# Patient Record
Sex: Female | Born: 1960 | Race: Black or African American | Hispanic: No | Smoking: Current every day smoker
Health system: Southern US, Community
[De-identification: ages and names within clinical notes are randomized; demographics above are authoritative.]

## PROBLEM LIST (undated history)

## (undated) DIAGNOSIS — I10 Essential (primary) hypertension: Secondary | ICD-10-CM

## (undated) DIAGNOSIS — F329 Major depressive disorder, single episode, unspecified: Secondary | ICD-10-CM

## (undated) DIAGNOSIS — M1990 Unspecified osteoarthritis, unspecified site: Secondary | ICD-10-CM

## (undated) DIAGNOSIS — F419 Anxiety disorder, unspecified: Secondary | ICD-10-CM

## (undated) DIAGNOSIS — K219 Gastro-esophageal reflux disease without esophagitis: Secondary | ICD-10-CM

## (undated) DIAGNOSIS — R269 Unspecified abnormalities of gait and mobility: Secondary | ICD-10-CM

## (undated) DIAGNOSIS — D649 Anemia, unspecified: Secondary | ICD-10-CM

## (undated) DIAGNOSIS — F32A Depression, unspecified: Secondary | ICD-10-CM

## (undated) DIAGNOSIS — G5600 Carpal tunnel syndrome, unspecified upper limb: Secondary | ICD-10-CM

## (undated) DIAGNOSIS — G43909 Migraine, unspecified, not intractable, without status migrainosus: Secondary | ICD-10-CM

## (undated) DIAGNOSIS — J189 Pneumonia, unspecified organism: Secondary | ICD-10-CM

## (undated) DIAGNOSIS — I639 Cerebral infarction, unspecified: Secondary | ICD-10-CM

## (undated) DIAGNOSIS — F17200 Nicotine dependence, unspecified, uncomplicated: Secondary | ICD-10-CM

## (undated) DIAGNOSIS — H269 Unspecified cataract: Secondary | ICD-10-CM

## (undated) DIAGNOSIS — R0600 Dyspnea, unspecified: Secondary | ICD-10-CM

## (undated) DIAGNOSIS — E785 Hyperlipidemia, unspecified: Secondary | ICD-10-CM

## (undated) HISTORY — DX: Cerebral infarction, unspecified: I63.9

## (undated) HISTORY — PX: OTHER SURGICAL HISTORY: SHX169

## (undated) HISTORY — DX: Unspecified cataract: H26.9

## (undated) HISTORY — PX: CARPAL TUNNEL RELEASE: SHX101

## (undated) HISTORY — DX: Hyperlipidemia, unspecified: E78.5

## (undated) HISTORY — DX: Depression, unspecified: F32.A

## (undated) HISTORY — PX: CATARACT EXTRACTION W/ INTRAOCULAR LENS IMPLANT: SHX1309

## (undated) HISTORY — PX: ABDOMINAL HYSTERECTOMY: SHX81

## (undated) HISTORY — DX: Unspecified osteoarthritis, unspecified site: M19.90

## (undated) HISTORY — PX: TUBAL LIGATION: SHX77

## (undated) HISTORY — DX: Nicotine dependence, unspecified, uncomplicated: F17.200

## (undated) HISTORY — DX: Unspecified abnormalities of gait and mobility: R26.9

## (undated) HISTORY — DX: Anxiety disorder, unspecified: F41.9

## (undated) HISTORY — DX: Carpal tunnel syndrome, unspecified upper limb: G56.00

---
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
## 2017-05-05 DIAGNOSIS — I639 Cerebral infarction, unspecified: Secondary | ICD-10-CM

## 2017-05-05 HISTORY — DX: Cerebral infarction, unspecified: I63.9

## 2018-01-23 ENCOUNTER — Emergency Department (HOSPITAL_COMMUNITY)
Admission: EM | Admit: 2018-01-23 | Discharge: 2018-01-23 | Disposition: A | Discharge status: Nursing Home (Includes ICF) | Payer: Self-pay | Source: Home / Self Care | Attending: Emergency Medicine | Admitting: Emergency Medicine

## 2018-01-23 ENCOUNTER — Other Ambulatory Visit: Payer: Self-pay

## 2018-01-23 ENCOUNTER — Emergency Department (HOSPITAL_COMMUNITY): Payer: Self-pay

## 2018-01-23 ENCOUNTER — Observation Stay (HOSPITAL_COMMUNITY)
Admission: EM | Admit: 2018-01-23 | Discharge: 2018-01-25 | Disposition: A | Discharge status: Home / Self Care | Payer: Self-pay | Attending: Internal Medicine | Admitting: Internal Medicine

## 2018-01-23 ENCOUNTER — Encounter (HOSPITAL_COMMUNITY): Payer: Self-pay | Admitting: Emergency Medicine

## 2018-01-23 DIAGNOSIS — R531 Weakness: Secondary | ICD-10-CM | POA: Insufficient documentation

## 2018-01-23 DIAGNOSIS — F329 Major depressive disorder, single episode, unspecified: Secondary | ICD-10-CM | POA: Insufficient documentation

## 2018-01-23 DIAGNOSIS — F17210 Nicotine dependence, cigarettes, uncomplicated: Secondary | ICD-10-CM | POA: Insufficient documentation

## 2018-01-23 DIAGNOSIS — N179 Acute kidney failure, unspecified: Secondary | ICD-10-CM | POA: Diagnosis present

## 2018-01-23 DIAGNOSIS — G459 Transient cerebral ischemic attack, unspecified: Principal | ICD-10-CM | POA: Insufficient documentation

## 2018-01-23 DIAGNOSIS — I10 Essential (primary) hypertension: Secondary | ICD-10-CM | POA: Insufficient documentation

## 2018-01-23 DIAGNOSIS — R0789 Other chest pain: Secondary | ICD-10-CM | POA: Insufficient documentation

## 2018-01-23 DIAGNOSIS — Z79899 Other long term (current) drug therapy: Secondary | ICD-10-CM | POA: Insufficient documentation

## 2018-01-23 DIAGNOSIS — R55 Syncope and collapse: Secondary | ICD-10-CM

## 2018-01-23 DIAGNOSIS — F32A Depression, unspecified: Secondary | ICD-10-CM | POA: Diagnosis present

## 2018-01-23 DIAGNOSIS — R079 Chest pain, unspecified: Secondary | ICD-10-CM

## 2018-01-23 DIAGNOSIS — Z7982 Long term (current) use of aspirin: Secondary | ICD-10-CM | POA: Insufficient documentation

## 2018-01-23 DIAGNOSIS — E876 Hypokalemia: Secondary | ICD-10-CM | POA: Diagnosis present

## 2018-01-23 HISTORY — DX: Unspecified osteoarthritis, unspecified site: M19.90

## 2018-01-23 HISTORY — DX: Essential (primary) hypertension: I10

## 2018-01-23 LAB — CBC
HCT: 43 % (ref 36.0–46.0)
HEMATOCRIT: 42.6 % (ref 36.0–46.0)
HEMOGLOBIN: 14 g/dL (ref 12.0–15.0)
HEMOGLOBIN: 14.2 g/dL (ref 12.0–15.0)
MCH: 29 pg (ref 26.0–34.0)
MCH: 29.2 pg (ref 26.0–34.0)
MCHC: 32.9 g/dL (ref 30.0–36.0)
MCHC: 33 g/dL (ref 30.0–36.0)
MCV: 88.2 fL (ref 78.0–100.0)
MCV: 88.3 fL (ref 78.0–100.0)
Platelets: 226 10*3/uL (ref 150–400)
Platelets: 220 10*3/uL (ref 150–400)
RBC: 4.83 MIL/uL (ref 3.87–5.11)
RBC: 4.87 MIL/uL (ref 3.87–5.11)
RDW: 12.8 % (ref 11.5–15.5)
RDW: 12.7 % (ref 11.5–15.5)
WBC: 6 10*3/uL (ref 4.0–10.5)
WBC: 5.9 10*3/uL (ref 4.0–10.5)

## 2018-01-23 LAB — URINALYSIS, ROUTINE W REFLEX MICROSCOPIC
Bacteria, UA: NONE SEEN
Bilirubin Urine: NEGATIVE
GLUCOSE, UA: NEGATIVE mg/dL
Ketones, ur: NEGATIVE mg/dL
Nitrite: NEGATIVE
PROTEIN: NEGATIVE mg/dL
Specific Gravity, Urine: 1.016 (ref 1.005–1.030)
pH: 5 (ref 5.0–8.0)

## 2018-01-23 LAB — COMPREHENSIVE METABOLIC PANEL
ALT: 14 U/L (ref 0–44)
AST: 39 U/L (ref 15–41)
Albumin: 4.2 g/dL (ref 3.5–5.0)
Alkaline Phosphatase: 85 U/L (ref 38–126)
Anion gap: 12 (ref 5–15)
BILIRUBIN TOTAL: 0.8 mg/dL (ref 0.3–1.2)
BUN: 20 mg/dL (ref 6–20)
CO2: 27 mmol/L (ref 22–32)
Calcium: 9.4 mg/dL (ref 8.9–10.3)
Chloride: 100 mmol/L (ref 98–111)
Creatinine, Ser: 1.67 mg/dL — ABNORMAL HIGH (ref 0.44–1.00)
GFR, EST AFRICAN AMERICAN: 38 mL/min — AB (ref >60)
GFR, EST NON AFRICAN AMERICAN: 33 mL/min — AB (ref >60)
Glucose, Bld: 169 mg/dL — ABNORMAL HIGH (ref 70–99)
Potassium: 3.1 mmol/L — ABNORMAL LOW (ref 3.5–5.1)
Sodium: 139 mmol/L (ref 135–145)
TOTAL PROTEIN: 7.2 g/dL (ref 6.5–8.1)

## 2018-01-23 LAB — DIFFERENTIAL
BASOS ABS: 0 10*3/uL (ref 0.0–0.1)
BASOS ABS: 0 10*3/uL (ref 0.0–0.1)
Basophils Relative: 0 %
Basophils Relative: 0 %
EOS ABS: 0.1 10*3/uL (ref 0.0–0.7)
Eosinophils Absolute: 0.1 10*3/uL (ref 0.0–0.7)
Eosinophils Relative: 1 %
Eosinophils Relative: 1 %
LYMPHS ABS: 2.2 10*3/uL (ref 0.7–4.0)
LYMPHS ABS: 2.2 10*3/uL (ref 0.7–4.0)
Lymphocytes Relative: 36 %
Lymphocytes Relative: 37 %
MONO ABS: 0.5 10*3/uL (ref 0.1–1.0)
MONOS PCT: 7 %
MONOS PCT: 8 %
Monocytes Absolute: 0.4 10*3/uL (ref 0.1–1.0)
Neutro Abs: 3.3 10*3/uL (ref 1.7–7.7)
Neutro Abs: 3.2 10*3/uL (ref 1.7–7.7)
Neutrophils Relative %: 56 %
Neutrophils Relative %: 54 %

## 2018-01-23 LAB — CBG MONITORING, ED: Glucose-Capillary: 194 mg/dL — ABNORMAL HIGH (ref 70–99)

## 2018-01-23 LAB — PROTIME-INR
INR: 0.97
INR: 0.95
Prothrombin Time: 12.8 seconds (ref 11.4–15.2)
Prothrombin Time: 12.6 seconds (ref 11.4–15.2)

## 2018-01-23 LAB — RAPID URINE DRUG SCREEN, HOSP PERFORMED
Amphetamines: NOT DETECTED
BARBITURATES: NOT DETECTED
Benzodiazepines: NOT DETECTED
Cocaine: NOT DETECTED
Opiates: NOT DETECTED
Tetrahydrocannabinol: NOT DETECTED

## 2018-01-23 LAB — I-STAT TROPONIN, ED
TROPONIN I, POC: 0 ng/mL (ref 0.00–0.08)
Troponin i, poc: 0 ng/mL (ref 0.00–0.08)

## 2018-01-23 LAB — I-STAT CHEM 8, ED
BUN: 24 mg/dL — ABNORMAL HIGH (ref 6–20)
CHLORIDE: 102 mmol/L (ref 98–111)
Calcium, Ion: 0.91 mmol/L — ABNORMAL LOW (ref 1.15–1.40)
Creatinine, Ser: 1.8 mg/dL — ABNORMAL HIGH (ref 0.44–1.00)
GLUCOSE: 165 mg/dL — AB (ref 70–99)
HCT: 42 % (ref 36.0–46.0)
Hemoglobin: 14.3 g/dL (ref 12.0–15.0)
POTASSIUM: 3.2 mmol/L — AB (ref 3.5–5.1)
Sodium: 136 mmol/L (ref 135–145)
TCO2: 26 mmol/L (ref 22–32)

## 2018-01-23 LAB — APTT
APTT: 28 s (ref 24–36)
aPTT: 27 seconds (ref 24–36)

## 2018-01-23 LAB — ETHANOL: Alcohol, Ethyl (B): <10 mg/dL (ref <10)

## 2018-01-23 LAB — I-STAT BETA HCG BLOOD, ED (MC, WL, AP ONLY): I-stat hCG, quantitative: <5 m[IU]/mL (ref <5)

## 2018-01-23 MED ORDER — HYDROCORTISONE NA SUCCINATE PF 250 MG IJ SOLR
200.0000 mg | Freq: Once | INTRAMUSCULAR | Status: AC
  Administered 2018-01-23: 200 mg via INTRAVENOUS
  Filled 2018-01-23: qty 200

## 2018-01-23 MED ORDER — DIPHENHYDRAMINE HCL 50 MG/ML IJ SOLN
25.0000 mg | Freq: Once | INTRAMUSCULAR | Status: AC | PRN
  Administered 2018-01-23: 25 mg via INTRAVENOUS
  Filled 2018-01-23: qty 1

## 2018-01-23 MED ORDER — HYDROCORTISONE NA SUCCINATE PF 250 MG IJ SOLR
200.0000 mg | Freq: Once | INTRAMUSCULAR | Status: DC
  Filled 2018-01-23: qty 200

## 2018-01-23 MED ORDER — DIPHENHYDRAMINE HCL 50 MG/ML IJ SOLN: 25.0000 mg | Freq: Once | INTRAMUSCULAR | Status: DC

## 2018-01-23 MED ORDER — IOPAMIDOL (ISOVUE-370) INJECTION 76%
125.0000 mL | Freq: Once | INTRAVENOUS | Status: AC | PRN
  Administered 2018-01-23: 100 mL via INTRAVENOUS

## 2018-01-23 MED ORDER — DIPHENHYDRAMINE HCL 50 MG/ML IJ SOLN
50.0000 mg | Freq: Once | INTRAMUSCULAR | Status: AC
  Administered 2018-01-23: 50 mg via INTRAVENOUS
  Filled 2018-01-23: qty 1

## 2018-01-23 NOTE — ED Notes (Signed)
AC called for Solu-Cortef

## 2018-01-23 NOTE — Progress Notes (Signed)
CODE STROKE 1629 CALL TIME 1649 EXAM STARTED 1651 EXAM FINISHED 1651 EXAM SENT TO SOC 1653 EXAM COMPLETE IN EPIC 1654 [place] [facility] CALLED

## 2018-01-23 NOTE — ED Notes (Addendum)
Ct notified of pt being medicated, ct advised that pt's next benadryl is 2137 and ct scan would be at 2237

## 2018-01-23 NOTE — ED Provider Notes (Signed)
Pt received at sign out with CT perfusion, CT-A head/neck pending. CT-A aorta held due to low GFR. CT's are reassuring. Pt's neuro exam has improved. VS remains stable. Troponin's negative x2 over 7 hour ED visit. Denies further CP/back pain. CXR ordered for completeness. Will admit for further stroke workup. 2340:  T/C returned from [facility] Dr. Darrick Meigs, case discussed, including:  HPI, pertinent PM/SHx, VS/PE, dx testing, ED course and treatment:  Aware CXR pending, agreeable to admit.   BP 113/81   Pulse 69   Temp 98.8 F (37.1 C) (Oral)   Resp 14   Ht 5\' 3"  (1.6 m)   Wt 76 kg   SpO2 97%   BMI 29.68 kg/m   Results for orders placed or performed during the hospital encounter of 01/23/18  Ethanol  Result Value Ref Range   Alcohol, Ethyl (B) <10 <10 mg/dL  Protime-INR  Result Value Ref Range   Prothrombin Time 12.6 11.4 - 15.2 seconds   INR 0.95   APTT  Result Value Ref Range   aPTT 27 24 - 36 seconds  CBC  Result Value Ref Range   WBC 5.9 4.0 - 10.5 K/uL   RBC 4.87 3.87 - 5.11 MIL/uL   Hemoglobin 14.2 12.0 - 15.0 g/dL   HCT 43.0 36.0 - 46.0 %   MCV 88.3 78.0 - 100.0 fL   MCH 29.2 26.0 - 34.0 pg   MCHC 33.0 30.0 - 36.0 g/dL   RDW 12.7 11.5 - 15.5 %   Platelets 220 150 - 400 K/uL  Differential  Result Value Ref Range   Neutrophils Relative % 54 %   Neutro Abs 3.2 1.7 - 7.7 K/uL   Lymphocytes Relative 37 %   Lymphs Abs 2.2 0.7 - 4.0 K/uL   Monocytes Relative 8 %   Monocytes Absolute 0.5 0.1 - 1.0 K/uL   Eosinophils Relative 1 %   Eosinophils Absolute 0.1 0.0 - 0.7 K/uL   Basophils Relative 0 %   Basophils Absolute 0.0 0.0 - 0.1 K/uL  Comprehensive metabolic panel  Result Value Ref Range   Sodium 139 135 - 145 mmol/L   Potassium 3.1 (L) 3.5 - 5.1 mmol/L   Chloride 100 98 - 111 mmol/L   CO2 27 22 - 32 mmol/L   Glucose, Bld 169 (H) 70 - 99 mg/dL   BUN 20 6 - 20 mg/dL   Creatinine, Ser 1.67 (H) 0.44 - 1.00 mg/dL   Calcium 9.4 8.9 - 10.3 mg/dL   Total Protein 7.2 6.5 - 8.1  g/dL   Albumin 4.2 3.5 - 5.0 g/dL   AST 39 15 - 41 U/L   ALT 14 0 - 44 U/L   Alkaline Phosphatase 85 38 - 126 U/L   Total Bilirubin 0.8 0.3 - 1.2 mg/dL   GFR calc non Af Amer 33 (L) >60 mL/min   GFR calc Af Amer 38 (L) >60 mL/min   Anion gap 12 5 - 15  Urine rapid drug screen (hosp performed)  Result Value Ref Range   Opiates NONE DETECTED NONE DETECTED   Cocaine NONE DETECTED NONE DETECTED   Benzodiazepines NONE DETECTED NONE DETECTED   Amphetamines NONE DETECTED NONE DETECTED   Tetrahydrocannabinol NONE DETECTED NONE DETECTED   Barbiturates NONE DETECTED NONE DETECTED  Urinalysis, Routine w reflex microscopic  Result Value Ref Range   Color, Urine YELLOW YELLOW   APPearance HAZY (A) CLEAR   Specific Gravity, Urine 1.016 1.005 - 1.030   pH 5.0 5.0 - 8.0  Glucose, UA NEGATIVE NEGATIVE mg/dL   Hgb urine dipstick SMALL (A) NEGATIVE   Bilirubin Urine NEGATIVE NEGATIVE   Ketones, ur NEGATIVE NEGATIVE mg/dL   Protein, ur NEGATIVE NEGATIVE mg/dL   Nitrite NEGATIVE NEGATIVE   Leukocytes, UA LARGE (A) NEGATIVE   RBC / HPF 21-50 0 - 5 RBC/hpf   WBC, UA 21-50 0 - 5 WBC/hpf   Bacteria, UA NONE SEEN NONE SEEN   Squamous Epithelial / LPF 0-5 0 - 5   Mucus PRESENT    Hyaline Casts, UA PRESENT   I-stat troponin, ED  Result Value Ref Range   Troponin i, poc 0.00 0.00 - 0.08 ng/mL   Comment 3           Ct Angio Head W Or Wo Contrast Result Date: 01/23/2018 CLINICAL DATA:  57 y/o F; left-sided weakness with onset earlier today. Further evaluation of stroke. EXAM: CT ANGIOGRAPHY HEAD AND NECK CT PERFUSION BRAIN TECHNIQUE: Multidetector CT imaging of the head and neck was performed using the standard protocol during bolus administration of intravenous contrast. Multiplanar CT image reconstructions and MIPs were obtained to evaluate the vascular anatomy. Carotid stenosis measurements (when applicable) are obtained utilizing NASCET criteria, using the distal internal carotid diameter as the  denominator. Multiphase CT imaging of the brain was performed following IV bolus contrast injection. Subsequent parametric perfusion maps were calculated using RAPID software. CONTRAST:  133mL ISOVUE-370 IOPAMIDOL (ISOVUE-370) INJECTION 76% COMPARISON:  01/23/2018 CT head. FINDINGS: CTA NECK FINDINGS Aortic arch: Standard branching. Imaged portion shows no evidence of aneurysm or dissection. No significant stenosis of the major arch vessel origins. Right carotid system: No evidence of dissection, stenosis (50% or greater) or occlusion. Left carotid system: No evidence of dissection, stenosis (50% or greater) or occlusion. Vertebral arteries: Right dominant. No evidence of dissection, stenosis (50% or greater) or occlusion. Skeleton: Moderate cervical spondylosis with predominantly discogenic degenerative changes from C3 through C7. There is loss of intervertebral disc space height and endplate marginal osteophytes. No high-grade bony canal stenosis. Uncovertebral and facet hypertrophy encroaches on the neural foramen bilaterally from C3 through C7. Other neck: 6 mm nodule in the right lobe of the thyroid gland. Dental disease with periapical cysts, absent crowns, and dental caries. Upper chest: Negative. Review of the MIP images confirms the above findings CTA HEAD FINDINGS Anterior circulation: No significant stenosis, proximal occlusion, aneurysm, or vascular malformation. Posterior circulation: No significant stenosis, proximal occlusion, aneurysm, or vascular malformation. Venous sinuses: As permitted by contrast timing, patent. Anatomic variants: Complete circle-of-Willis. Delayed phase: No abnormal intracranial enhancement. Review of the MIP images confirms the above findings CT Brain Perfusion Findings: CBF (<30%) Volume: 25mL Perfusion (Tmax>6.0s) volume: 77mL Mismatch Volume: 34mL Infarction Location:Not applicable. IMPRESSION: 1. Normal CT perfusion head. 2. Patent carotid and vertebral arteries. No  dissection, aneurysm, or hemodynamically significant stenosis utilizing NASCET criteria. 3. Patent anterior and posterior intracranial circulation. No large vessel occlusion, aneurysm, or significant stenosis. These results were called by telephone at the time of interpretation on 01/23/2018 at 11:15 pm to Dr. Thurnell Garbe, who verbally acknowledged these results. Electronically Signed   By: Kristine Garbe M.D.   On: 01/23/2018 23:19   Ct Angio Neck W And/or Wo Contrast Result Date: 01/23/2018 CLINICAL DATA:  57 y/o F; left-sided weakness with onset earlier today. Further evaluation of stroke. EXAM: CT ANGIOGRAPHY HEAD AND NECK CT PERFUSION BRAIN TECHNIQUE: Multidetector CT imaging of the head and neck was performed using the standard protocol during bolus administration of intravenous contrast.  Multiplanar CT image reconstructions and MIPs were obtained to evaluate the vascular anatomy. Carotid stenosis measurements (when applicable) are obtained utilizing NASCET criteria, using the distal internal carotid diameter as the denominator. Multiphase CT imaging of the brain was performed following IV bolus contrast injection. Subsequent parametric perfusion maps were calculated using RAPID software. CONTRAST:  152mL ISOVUE-370 IOPAMIDOL (ISOVUE-370) INJECTION 76% COMPARISON:  01/23/2018 CT head. FINDINGS: CTA NECK FINDINGS Aortic arch: Standard branching. Imaged portion shows no evidence of aneurysm or dissection. No significant stenosis of the major arch vessel origins. Right carotid system: No evidence of dissection, stenosis (50% or greater) or occlusion. Left carotid system: No evidence of dissection, stenosis (50% or greater) or occlusion. Vertebral arteries: Right dominant. No evidence of dissection, stenosis (50% or greater) or occlusion. Skeleton: Moderate cervical spondylosis with predominantly discogenic degenerative changes from C3 through C7. There is loss of intervertebral disc space height and  endplate marginal osteophytes. No high-grade bony canal stenosis. Uncovertebral and facet hypertrophy encroaches on the neural foramen bilaterally from C3 through C7. Other neck: 6 mm nodule in the right lobe of the thyroid gland. Dental disease with periapical cysts, absent crowns, and dental caries. Upper chest: Negative. Review of the MIP images confirms the above findings CTA HEAD FINDINGS Anterior circulation: No significant stenosis, proximal occlusion, aneurysm, or vascular malformation. Posterior circulation: No significant stenosis, proximal occlusion, aneurysm, or vascular malformation. Venous sinuses: As permitted by contrast timing, patent. Anatomic variants: Complete circle-of-Willis. Delayed phase: No abnormal intracranial enhancement. Review of the MIP images confirms the above findings CT Brain Perfusion Findings: CBF (<30%) Volume: 83mL Perfusion (Tmax>6.0s) volume: 84mL Mismatch Volume: 2mL Infarction Location:Not applicable. IMPRESSION: 1. Normal CT perfusion head. 2. Patent carotid and vertebral arteries. No dissection, aneurysm, or hemodynamically significant stenosis utilizing NASCET criteria. 3. Patent anterior and posterior intracranial circulation. No large vessel occlusion, aneurysm, or significant stenosis. These results were called by telephone at the time of interpretation on 01/23/2018 at 11:15 pm to Dr. Thurnell Garbe, who verbally acknowledged these results. Electronically Signed   By: Kristine Garbe M.D.   On: 01/23/2018 23:19   Ct Cerebral Perfusion W Contrast Result Date: 01/23/2018 CLINICAL DATA:  57 y/o F; left-sided weakness with onset earlier today. Further evaluation of stroke. EXAM: CT ANGIOGRAPHY HEAD AND NECK CT PERFUSION BRAIN TECHNIQUE: Multidetector CT imaging of the head and neck was performed using the standard protocol during bolus administration of intravenous contrast. Multiplanar CT image reconstructions and MIPs were obtained to evaluate the vascular anatomy.  Carotid stenosis measurements (when applicable) are obtained utilizing NASCET criteria, using the distal internal carotid diameter as the denominator. Multiphase CT imaging of the brain was performed following IV bolus contrast injection. Subsequent parametric perfusion maps were calculated using RAPID software. CONTRAST:  17mL ISOVUE-370 IOPAMIDOL (ISOVUE-370) INJECTION 76% COMPARISON:  01/23/2018 CT head. FINDINGS: CTA NECK FINDINGS Aortic arch: Standard branching. Imaged portion shows no evidence of aneurysm or dissection. No significant stenosis of the major arch vessel origins. Right carotid system: No evidence of dissection, stenosis (50% or greater) or occlusion. Left carotid system: No evidence of dissection, stenosis (50% or greater) or occlusion. Vertebral arteries: Right dominant. No evidence of dissection, stenosis (50% or greater) or occlusion. Skeleton: Moderate cervical spondylosis with predominantly discogenic degenerative changes from C3 through C7. There is loss of intervertebral disc space height and endplate marginal osteophytes. No high-grade bony canal stenosis. Uncovertebral and facet hypertrophy encroaches on the neural foramen bilaterally from C3 through C7. Other neck: 6 mm nodule in the right  lobe of the thyroid gland. Dental disease with periapical cysts, absent crowns, and dental caries. Upper chest: Negative. Review of the MIP images confirms the above findings CTA HEAD FINDINGS Anterior circulation: No significant stenosis, proximal occlusion, aneurysm, or vascular malformation. Posterior circulation: No significant stenosis, proximal occlusion, aneurysm, or vascular malformation. Venous sinuses: As permitted by contrast timing, patent. Anatomic variants: Complete circle-of-Willis. Delayed phase: No abnormal intracranial enhancement. Review of the MIP images confirms the above findings CT Brain Perfusion Findings: CBF (<30%) Volume: 69mL Perfusion (Tmax>6.0s) volume: 53mL Mismatch  Volume: 59mL Infarction Location:Not applicable. IMPRESSION: 1. Normal CT perfusion head. 2. Patent carotid and vertebral arteries. No dissection, aneurysm, or hemodynamically significant stenosis utilizing NASCET criteria. 3. Patent anterior and posterior intracranial circulation. No large vessel occlusion, aneurysm, or significant stenosis. These results were called by telephone at the time of interpretation on 01/23/2018 at 11:15 pm to Dr. Thurnell Garbe, who verbally acknowledged these results. Electronically Signed   By: Kristine Garbe M.D.   On: 01/23/2018 23:19       Francine Graven, DO 01/24/18 1540

## 2018-01-23 NOTE — ED Notes (Signed)
Patient states that she has sensation in her left foot and leg at this time. Patient is able to flex her left foot and leg.

## 2018-01-23 NOTE — ED Notes (Signed)
Patient transported to CT 

## 2018-01-23 NOTE — ED Triage Notes (Addendum)
Wrong chart initially arrived due to wrong name provided by EMS. This chart is set to be merged with initial chart. EDP,Teleneuro,CT,LAB aware.   Pt reports around 1600, left sided weakness, sudden onset chest pain and then syncopal episode. Pt reports intermittent weakness throughout the day. Pt alert, speech clear. Pt reports had TIA in November of 2018. Pt reports is taking baby aspirin.

## 2018-01-23 NOTE — ED Notes (Signed)
Do to last name error upon registration the patient's chart has to be merged. This was reported off at shift change by RN Domenica Reamer, RN Sherlene Shams also advised that due to this error patient's I-stats were not crossed over. RN Sherlene Shams advised that all I-stats were within normal ranges.  Rexene Edison RN

## 2018-01-23 NOTE — ED Notes (Signed)
Pt family come out to nurses station and stated" I want her transferred to [facility] and I live in [place]." pt and pt family informed that if the services that the patient needs is available at [facility] pt will remain here pending results. Primary RN aware.

## 2018-01-23 NOTE — Progress Notes (Signed)
GOLD DANGLE EARRING REMOVED BY CRYSTAL BLACKBURN RN PLACED IN ZIPLOC BAG AND GIVEN TO PATIENT.

## 2018-01-23 NOTE — ED Provider Notes (Signed)
[facility] Emergency Department Provider Note MRN:  [id number]  Arrival date & time: 01/23/18     Chief Complaint   Syncope and weakness  History of Present Illness   Wendy Leblanc is a 57 y.o. year-old female with a history of stroke presenting to the ED with chief complaint of syncope and weakness.  At approximately 4 PM, patient experienced sudden onset central chest pain, neck pain, back pain and then had a witnessed collapse.  Reportedly unconscious for several minutes.  When she woke, she exhibited complete left-sided paralysis.  This prompted EMS call, transport, code stroke initiation prior to arrival.  In route her left arm weakness has improved, continues to have left leg weakness.  Continues to endorse 4 out of 10 in severity sharp central chest pain radiating to the neck and back.  Review of Systems  A complete 10 system review of systems was obtained and all systems are negative except as noted in the HPI and PMH.   Patient's Health History    Past Medical History:  Diagnosis Date  . Arthritis   . Hypertension     Past Surgical History:  Procedure Laterality Date  . ABDOMINAL HYSTERECTOMY    . CARPAL TUNNEL RELEASE      History reviewed. No pertinent family history.  Social History   Socioeconomic History  . Marital status: Widowed    Spouse name: Not on file  . Number of children: Not on file  . Years of education: Not on file  . Highest education level: Not on file  Occupational History  . Not on file  Social Needs  . Financial resource strain: Not on file  . Food insecurity:    Worry: Not on file    Inability: Not on file  . Transportation needs:    Medical: Not on file    Non-medical: Not on file  Tobacco Use  . Smoking status: Current Every Day Smoker    Packs/day: 0.50    Types: Cigarettes  . Smokeless tobacco: Never Used  Substance and Sexual Activity  . Alcohol use: Not on file  . Drug use: Not on file  . Sexual activity:  Not on file  Lifestyle  . Physical activity:    Days per week: Not on file    Minutes per session: Not on file  . Stress: Not on file  Relationships  . Social connections:    Talks on phone: Not on file    Gets together: Not on file    Attends religious service: Not on file    Active member of club or organization: Not on file    Attends meetings of clubs or organizations: Not on file    Relationship status: Not on file  . Intimate partner violence:    Fear of current or ex partner: Not on file    Emotionally abused: Not on file    Physically abused: Not on file    Forced sexual activity: Not on file  Other Topics Concern  . Not on file  Social History Narrative  . Not on file     Physical Exam  Vital Signs and Nursing Notes reviewed Vitals:   01/23/18 2030 01/23/18 2100  BP: 121/73 108/68  Pulse: 69 63  Resp: 16 15  Temp:    SpO2: 100% 97%    CONSTITUTIONAL:  well-appearing, NAD NEURO:  Alert and oriented x 3, no focal deficits, mild left arm weakness, moderate left leg weakness EYES:  eyes equal  and reactive, no field cuts, extraocular movements normal ENT/NECK:  no LAD, no JVD CARDIO:  regular rate, well-perfused, normal S1 and S2 PULM:  CTAB no wheezing or rhonchi GI/GU:  normal bowel sounds, non-distended, non-tender MSK/SPINE:  No gross deformities, no edema SKIN:  no rash, atraumatic PSYCH:  Appropriate speech and behavior  Diagnostic and Interventional Summary    EKG Interpretation  Date/Time:  Saturday January 23 2018 17:17:08 EDT Ventricular Rate:  99 PR Interval:    QRS Duration: 91 QT Interval:  338 QTC Calculation: 434 R Axis:   65 Text Interpretation:  Sinus rhythm Consider left ventricular hypertrophy Borderline T abnormalities, diffuse leads Confirmed by Gerlene Fee [phone]) on 01/23/2018 9:55:05 PM      Labs Reviewed  COMPREHENSIVE METABOLIC PANEL - Abnormal; Notable for the following components:      Result Value   Potassium 3.1 (*)     Glucose, Bld 169 (*)    Creatinine, Ser 1.67 (*)    GFR calc non Af Amer 33 (*)    GFR calc Af Amer 38 (*)    All other components within normal limits  URINALYSIS, ROUTINE W REFLEX MICROSCOPIC - Abnormal; Notable for the following components:   APPearance HAZY (*)    Hgb urine dipstick SMALL (*)    Leukocytes, UA LARGE (*)    All other components within normal limits  ETHANOL  PROTIME-INR  APTT  CBC  DIFFERENTIAL  RAPID URINE DRUG SCREEN, HOSP PERFORMED  I-STAT CHEM 8, ED  I-STAT TROPONIN, ED  I-STAT BETA HCG BLOOD, ED ([facility], [facility], [facility] ONLY)  I-STAT TROPONIN, ED    CT Angio Head W or Wo Contrast    (Results Pending)  CT Angio Neck W and/or Wo Contrast    (Results Pending)  CT CEREBRAL PERFUSION W CONTRAST    (Results Pending)  CT Angio Chest Aorta W and/or Wo Contrast    (Results Pending)    Medications  iopamidol (ISOVUE-370) 76 % injection 125 mL (has no administration in time range)  hydrocortisone sodium succinate (SOLU-CORTEF) injection 200 mg (200 mg Intravenous Given 01/23/18 1833)  diphenhydrAMINE (BENADRYL) injection 50 mg (50 mg Intravenous Given 01/23/18 1833)  diphenhydrAMINE (BENADRYL) injection 25 mg (25 mg Intravenous Given 01/23/18 2129)     Procedures Critical Care Critical Care Documentation Critical care time provided by me (excluding procedures): 45 minutes  Condition necessitating critical care: Code stroke initiation  Components of critical care management: reviewing of prior records, laboratory and imaging interpretation, frequent re-examination and reassessment of vital signs, discussion with consulting services    ED Course and Medical Decision Making  I have reviewed the triage vital signs and the nursing notes.  Pertinent labs & imaging results that were available during my care of the patient were reviewed by me and considered in my medical decision making (see below for details).  Concern for carotid versus aortic dissection, acute stroke in  this 57 year old female with history of stroke, chest pain, neck pain followed by syncope and focal neurological deficit.  Code stroke alert initiated, awaiting CT transport.  Blood glucose within normal limits, vital signs stable, protecting airway, conversant.  Clinical Course as of Jan 24 2219  Sat Jan 23, 2018  1756 Multiple discussions with telemetry neurologist and radiologist regarding how to best obtain imaging in this patient.  Patient with significant contrast allergy in the past.  Has been ruled out as a TPA candidate by neurology.  Patient is hemodynamically stable and well-appearing, pain very mild at  this point.  The only accepted and most expedited pretreatment regimen for contrast allergy takes 4 hours.  We consider transferring to come to obtain MRI, but without being a TPA candidate and doubting any significant time that we would gain, we have elected to keep the patient here, pretreat and obtain a CTA imaging required.  There would be little to no benefit of noncontrasted studies per radiology.   [MB]  2116 As discussed with Dr. Beatris Ship of radiology, will go for CTA imaging at 2237, when pretreatment is completed.   [MB]    Clinical Course User Index [MB] Maudie Flakes, MD   Awaiting CTA results.  If negative will still need admission to hospital service, who can transfer to [facility] for MRI.  Signed out to Dr. Thurnell Garbe at shift change.  Barth Kirks. Sedonia Small, [place] [email or web address]  Final Clinical Impressions(s) / ED Diagnoses     ICD-10-CM   1. Chest pain, unspecified type R07.9   2. Left-sided weakness R53.1     ED Discharge Orders    None         Maudie Flakes, MD 01/23/18 2220

## 2018-01-24 ENCOUNTER — Observation Stay (HOSPITAL_COMMUNITY): Payer: Self-pay

## 2018-01-24 ENCOUNTER — Observation Stay (HOSPITAL_BASED_OUTPATIENT_CLINIC_OR_DEPARTMENT_OTHER): Payer: Self-pay

## 2018-01-24 DIAGNOSIS — G459 Transient cerebral ischemic attack, unspecified: Secondary | ICD-10-CM | POA: Diagnosis present

## 2018-01-24 DIAGNOSIS — R079 Chest pain, unspecified: Secondary | ICD-10-CM

## 2018-01-24 DIAGNOSIS — F32A Depression, unspecified: Secondary | ICD-10-CM | POA: Diagnosis present

## 2018-01-24 DIAGNOSIS — R55 Syncope and collapse: Secondary | ICD-10-CM

## 2018-01-24 DIAGNOSIS — I10 Essential (primary) hypertension: Secondary | ICD-10-CM | POA: Diagnosis present

## 2018-01-24 DIAGNOSIS — N179 Acute kidney failure, unspecified: Secondary | ICD-10-CM | POA: Diagnosis present

## 2018-01-24 DIAGNOSIS — F329 Major depressive disorder, single episode, unspecified: Secondary | ICD-10-CM | POA: Diagnosis present

## 2018-01-24 DIAGNOSIS — E876 Hypokalemia: Secondary | ICD-10-CM | POA: Diagnosis present

## 2018-01-24 LAB — TROPONIN I
Troponin I: <0.03 ng/mL (ref <0.03)
Troponin I: <0.03 ng/mL (ref <0.03)

## 2018-01-24 LAB — BASIC METABOLIC PANEL
ANION GAP: 13 (ref 5–15)
BUN: 13 mg/dL (ref 6–20)
CHLORIDE: 102 mmol/L (ref 98–111)
CO2: 26 mmol/L (ref 22–32)
Calcium: 8.9 mg/dL (ref 8.9–10.3)
Creatinine, Ser: 0.99 mg/dL (ref 0.44–1.00)
GFR calc non Af Amer: >60 mL/min (ref >60)
Glucose, Bld: 109 mg/dL — ABNORMAL HIGH (ref 70–99)
Potassium: 3.4 mmol/L — ABNORMAL LOW (ref 3.5–5.1)
Sodium: 141 mmol/L (ref 135–145)

## 2018-01-24 LAB — ECHOCARDIOGRAM COMPLETE
Height: 63 in
Weight: 2719.59 oz

## 2018-01-24 LAB — MAGNESIUM: Magnesium: 2 mg/dL (ref 1.7–2.4)

## 2018-01-24 LAB — LIPID PANEL
CHOLESTEROL: 115 mg/dL (ref 0–200)
HDL: 51 mg/dL (ref >40)
LDL Cholesterol: 58 mg/dL (ref 0–99)
TRIGLYCERIDES: 32 mg/dL (ref <150)
Total CHOL/HDL Ratio: 2.3 RATIO
VLDL: 6 mg/dL (ref 0–40)

## 2018-01-24 LAB — HEMOGLOBIN A1C
Hgb A1c MFr Bld: 5.7 % — ABNORMAL HIGH (ref 4.8–5.6)
MEAN PLASMA GLUCOSE: 116.89 mg/dL

## 2018-01-24 MED ORDER — SODIUM CHLORIDE 0.9 % IV BOLUS
1000.0000 mL | Freq: Once | INTRAVENOUS | Status: AC
  Administered 2018-01-25: 1000 mL via INTRAVENOUS

## 2018-01-24 MED ORDER — ATORVASTATIN CALCIUM 10 MG PO TABS
20.0000 mg | ORAL_TABLET | Freq: Every day | ORAL | Status: DC
  Administered 2018-01-24 – 2018-01-25 (×2): 20 mg via ORAL
  Filled 2018-01-24 (×2): qty 2

## 2018-01-24 MED ORDER — POTASSIUM CHLORIDE CRYS ER 20 MEQ PO TBCR
40.0000 meq | EXTENDED_RELEASE_TABLET | Freq: Once | ORAL | Status: AC
  Administered 2018-01-24: 40 meq via ORAL
  Filled 2018-01-24: qty 2

## 2018-01-24 MED ORDER — POTASSIUM CHLORIDE 10 MEQ/100ML IV SOLN
10.0000 meq | INTRAVENOUS | Status: DC
  Administered 2018-01-24 (×2): 10 meq via INTRAVENOUS
  Filled 2018-01-24 (×3): qty 100

## 2018-01-24 MED ORDER — SENNOSIDES-DOCUSATE SODIUM 8.6-50 MG PO TABS
1.0000 | ORAL_TABLET | Freq: Every evening | ORAL | Status: DC | PRN
  Administered 2018-01-25: 1 via ORAL
  Filled 2018-01-24: qty 1

## 2018-01-24 MED ORDER — SODIUM CHLORIDE 0.9 % IV SOLN
INTRAVENOUS | Status: DC
  Administered 2018-01-24 (×2): via INTRAVENOUS

## 2018-01-24 MED ORDER — POTASSIUM CHLORIDE 10 MEQ/100ML IV SOLN
10.0000 meq | INTRAVENOUS | Status: DC
  Administered 2018-01-24 (×2): 10 meq via INTRAVENOUS
  Filled 2018-01-24 (×2): qty 100

## 2018-01-24 MED ORDER — SODIUM CHLORIDE 0.9 % IV SOLN: INTRAVENOUS | Status: DC

## 2018-01-24 MED ORDER — HYDRALAZINE HCL 20 MG/ML IJ SOLN: 5.0000 mg | Freq: Three times a day (TID) | INTRAMUSCULAR | Status: DC | PRN

## 2018-01-24 MED ORDER — TOPIRAMATE 25 MG PO TABS
25.0000 mg | ORAL_TABLET | Freq: Two times a day (BID) | ORAL | Status: DC
  Administered 2018-01-24 – 2018-01-25 (×3): 25 mg via ORAL
  Filled 2018-01-24 (×3): qty 1

## 2018-01-24 MED ORDER — STROKE: EARLY STAGES OF RECOVERY BOOK
Freq: Once | Status: AC
  Administered 2018-01-24: 12:00:00
  Filled 2018-01-24 (×2): qty 1

## 2018-01-24 MED ORDER — ESCITALOPRAM OXALATE 10 MG PO TABS
10.0000 mg | ORAL_TABLET | Freq: Every day | ORAL | Status: DC
  Administered 2018-01-24 – 2018-01-25 (×2): 10 mg via ORAL
  Filled 2018-01-24 (×2): qty 1

## 2018-01-24 MED ORDER — POTASSIUM CHLORIDE CRYS ER 20 MEQ PO TBCR: 30.0000 meq | EXTENDED_RELEASE_TABLET | Freq: Once | ORAL | Status: DC

## 2018-01-24 MED ORDER — ENOXAPARIN SODIUM 40 MG/0.4ML ~~LOC~~ SOLN
40.0000 mg | SUBCUTANEOUS | Status: DC
  Administered 2018-01-24 – 2018-01-25 (×2): 40 mg via SUBCUTANEOUS
  Filled 2018-01-24 (×2): qty 0.4

## 2018-01-24 MED ORDER — ASPIRIN EC 81 MG PO TBEC
81.0000 mg | DELAYED_RELEASE_TABLET | Freq: Every day | ORAL | Status: DC
  Administered 2018-01-24 – 2018-01-25 (×2): 81 mg via ORAL
  Filled 2018-01-24 (×2): qty 1

## 2018-01-24 MED ORDER — SODIUM CHLORIDE 0.9 % IV SOLN
INTRAVENOUS | Status: DC | PRN
  Administered 2018-01-24: 1000 mL via INTRAVENOUS

## 2018-01-24 NOTE — ED Notes (Signed)
Patient given ginger-ale and graham crackers. After returning from  X-ray and CT

## 2018-01-24 NOTE — ED Notes (Signed)
Radiology contacted per Dr. Darrick Meigs about CT of the chest not completed. Gwyndolyn Saxon in radiology stated that per the radiologist Dr. Beatris Ship that patient's creatine was to low and that patient had already been pre-treated and injected twice for other CT scans. Dr. Darrick Meigs paged and was notified via phone as to why these the CT angio of the chest was not completed.  Rexene Edison RN

## 2018-01-24 NOTE — ED Notes (Signed)
New patient created at time of arrival per information provided to ED staff from EMS. During treatment, pt became more alert and stated last name "Cleaves" rather than Jones. Registration, Primary RN, EDP aware.

## 2018-01-24 NOTE — Progress Notes (Signed)
  Echocardiogram 2D Echocardiogram has been performed.  Jannett Celestine 01/24/2018, 3:00 PM

## 2018-01-24 NOTE — H&P (Addendum)
Attending MD note   Patient was seen, examined,treatment plan was discussed with the  Advance Practice Provider.  I have personally reviewed the clinical findings, lab,EKG, imaging studies and management of this patient.  I have also reviewed the orders written for this patient.  I agree with the documentation, as recorded by the Advance Practice Provider, Sharene Butters, PA.    57 year old female presented to our facility with chest pain neck pain and back pain.  Concern for possible TIA.  She had associated chest pain and syncope.  Work-up is in process.       Lady Deutscher, MD [facility] See Amion for pager # [facility]  Progress note   Rabecca Birge NOM:[id number] DOB: 13-Nov-1960 DOA: 01/23/2018   PCP: Patient, No Pcp Per   Patient coming from:  Home    Chief Complaint: Chest pain and syncope, left arm weakness  HPI: Bathsheba Durrett is a 57 y.o. female with medical history significant for remote stroke, hypertension, HLD, depression, presenting to [facility] emergency department after sustaining chest pain, complaining of right neck and back pain, followed by witnessed syncopal episode, around 4 PM yesterday.  The pain was sudden, central.  She believes that she may have been "out for a few minutes ", and upon waking up, she reported left-sided weakness, which eventually resolved.  She reports that may have had a similar episode "a few weeks ago " She denies any vertigo, amaurosis fugax, neck pain, difficulty swallowing, or dysarthria.  She denied any nausea, vomiting or diarrhea.  She denies any fever, or dysuria.  She denies any numbness.  Code stroke was called upon presentation to the emergency department, and then was evaluated by tele-neurology, which ruled out the patient is a TPA candidate.  His neurological symptoms eventually resolved.  She is back to her baseline.  CT of the head and neck is negative for stenosis or dissection. CT Angie of  the head and neck are negative for acute findings.  Chest x-ray is negative. Troponins were negative in the emergency department.  EKG is sinus rhythm, with possible LVH.  Large leukocytes were found to urine, but she has normal white count  She also had a potassium of 3.1, likely due to HCTZ, which was replenished at the ER.  She is on permissive hypertension at this time. She was transferred to [facility] for MRI.  Review of Systems:  As per HPI otherwise all other systems reviewed and are negative  Past Medical History:  Diagnosis Date  . Arthritis   . Hypertension     Past Surgical History:  Procedure Laterality Date  . ABDOMINAL HYSTERECTOMY    . CARPAL TUNNEL RELEASE      Social History Social History   Socioeconomic History  . Marital status: Widowed    Spouse name: Not on file  . Number of children: Not on file  . Years of education: Not on file  . Highest education level: Not on file  Occupational History  . Not on file  Social Needs  . Financial resource strain: Not on file  . Food insecurity:    Worry: Not on file    Inability: Not on file  . Transportation needs:    Medical: Not on file    Non-medical: Not on file  Tobacco Use  . Smoking status: Current Every Day Smoker    Packs/day: 0.50    Types: Cigarettes  . Smokeless tobacco: Never  Used  Substance and Sexual Activity  . Alcohol use: Not on file  . Drug use: Not on file  . Sexual activity: Not on file  Lifestyle  . Physical activity:    Days per week: Not on file    Minutes per session: Not on file  . Stress: Not on file  Relationships  . Social connections:    Talks on phone: Not on file    Gets together: Not on file    Attends religious service: Not on file    Active member of club or organization: Not on file    Attends meetings of clubs or organizations: Not on file    Relationship status: Not on file  . Intimate partner violence:    Fear of current or ex partner: Not on file     Emotionally abused: Not on file    Physically abused: Not on file    Forced sexual activity: Not on file  Other Topics Concern  . Not on file  Social History Narrative  . Not on file     Allergies  Allergen Reactions  . Penicillins     ANAPHYLAXIS   . Contrast Media [Iodinated Diagnostic Agents]     hives  . Sulfur     HIVES     History reviewed. No pertinent family history.     Prior to Admission medications   Medication Sig Start Date End Date Taking? Authorizing Provider  amLODipine (NORVASC) 10 MG tablet Take 10 mg by mouth daily. 12/29/17  Yes [provider]  aspirin EC 81 MG tablet Take 81 mg by mouth daily.   Yes [provider]  atorvastatin (LIPITOR) 20 MG tablet Take 20 mg by mouth daily.   Yes [provider]  escitalopram (LEXAPRO) 10 MG tablet Take 10 mg by mouth daily.   Yes [provider]  lisinopril-hydrochlorothiazide (PRINZIDE,ZESTORETIC) 20-25 MG tablet Take 0.5 tablets by mouth daily.  12/29/17  Yes [provider]  topiramate (TOPAMAX) 25 MG tablet Take 25 mg by mouth 2 (two) times daily. 12/29/17  Yes [provider]     Physical Exam:  Vitals:   01/24/18 0500 01/24/18 0623 01/24/18 0800 01/24/18 1000  BP: (!) 130/93 124/83 113/72 (!) 121/96  Pulse: 65 63 (!) 57   Resp: 18 18 18 18   Temp:  98.4 F (36.9 C)    TempSrc:  Oral    SpO2: 100% 100% 100%   Weight:  77.1 kg    Height:  5\' 3"  (1.6 m)     Constitutional: NAD, calm, comfortable  Eyes: PERRL, lids and conjunctivae normal ENMT: Mucous membranes are moist, without exudate or lesions  Neck: normal, supple, no masses, no thyromegaly Respiratory: clear to auscultation bilaterally, no wheezing, no crackles. Normal respiratory effort  Cardiovascular: Regular rate and rhythm with occasional ectopic beat,  murmur, rubs or gallops. No extremity edema. 2+ pedal pulses. No carotid bruits.  Abdomen: Soft, non tender, No hepatosplenomegaly. Bowel  sounds positive.  Musculoskeletal: no clubbing / cyanosis. Moves all extremities Skin: no jaundice, No lesions.  Neurologic: Sensation intact  Strength equal in all extremities Psychiatric:   Alert and oriented x 3. Normal mood.     Labs on Admission: I have personally reviewed following labs and imaging studies  CBC: Recent Labs  Lab 01/23/18 1648  WBC 5.9  NEUTROABS 3.2  HGB 14.2  HCT 43.0  MCV 88.3  PLT 161    Basic Metabolic Panel: Recent Labs  Lab 01/23/18  1648 01/24/18 0918  NA 139 141  K 3.1* 3.4*  CL 100 102  CO2 27 26  GLUCOSE 169* 109*  BUN 20 13  CREATININE 1.67* 0.99  CALCIUM 9.4 8.9    GFR: Estimated Creatinine Clearance: 60.9 mL/min (by C-G formula based on SCr of 0.99 mg/dL).  Liver Function Tests: Recent Labs  Lab 01/23/18 1648  AST 39  ALT 14  ALKPHOS 85  BILITOT 0.8  PROT 7.2  ALBUMIN 4.2   No results for input(s): LIPASE, AMYLASE in the last 168 hours. No results for input(s): AMMONIA in the last 168 hours.  Coagulation Profile: Recent Labs  Lab 01/23/18 1648  INR 0.95    Cardiac Enzymes: Recent Labs  Lab 01/24/18 0918  TROPONINI <0.03    BNP (last 3 results) No results for input(s): PROBNP in the last 8760 hours.  HbA1C: Recent Labs    01/24/18 0918  HGBA1C 5.7*    CBG: No results for input(s): GLUCAP in the last 168 hours.  Lipid Profile: Recent Labs    01/24/18 0918  CHOL 115  HDL 51  LDLCALC 58  TRIG 32  CHOLHDL 2.3    Thyroid Function Tests: No results for input(s): TSH, T4TOTAL, FREET4, T3FREE, THYROIDAB in the last 72 hours.  Anemia Panel: No results for input(s): VITAMINB12, FOLATE, FERRITIN, TIBC, IRON, RETICCTPCT in the last 72 hours.  Urine analysis:    Component Value Date/Time   COLORURINE YELLOW 01/23/2018 1931   APPEARANCEUR HAZY (A) 01/23/2018 1931   LABSPEC 1.016 01/23/2018 1931   PHURINE 5.0 01/23/2018 1931   GLUCOSEU NEGATIVE 01/23/2018 1931   HGBUR SMALL (A) 01/23/2018 1931    BILIRUBINUR NEGATIVE 01/23/2018 1931   KETONESUR NEGATIVE 01/23/2018 1931   PROTEINUR NEGATIVE 01/23/2018 1931   NITRITE NEGATIVE 01/23/2018 1931   LEUKOCYTESUR LARGE (A) 01/23/2018 1931    Sepsis Labs: @LABRCNTIP (procalcitonin:4,lacticidven:4) )No results found for this or any previous visit (from the past 240 hour(s)).   Radiological Exams on Admission: Ct Angio Head W Or Wo Contrast  Result Date: 01/23/2018 CLINICAL DATA:  57 y/o F; left-sided weakness with onset earlier today. Further evaluation of stroke. EXAM: CT ANGIOGRAPHY HEAD AND NECK CT PERFUSION BRAIN TECHNIQUE: Multidetector CT imaging of the head and neck was performed using the standard protocol during bolus administration of intravenous contrast. Multiplanar CT image reconstructions and MIPs were obtained to evaluate the vascular anatomy. Carotid stenosis measurements (when applicable) are obtained utilizing NASCET criteria, using the distal internal carotid diameter as the denominator. Multiphase CT imaging of the brain was performed following IV bolus contrast injection. Subsequent parametric perfusion maps were calculated using RAPID software. CONTRAST:  148mL ISOVUE-370 IOPAMIDOL (ISOVUE-370) INJECTION 76% COMPARISON:  01/23/2018 CT head. FINDINGS: CTA NECK FINDINGS Aortic arch: Standard branching. Imaged portion shows no evidence of aneurysm or dissection. No significant stenosis of the major arch vessel origins. Right carotid system: No evidence of dissection, stenosis (50% or greater) or occlusion. Left carotid system: No evidence of dissection, stenosis (50% or greater) or occlusion. Vertebral arteries: Right dominant. No evidence of dissection, stenosis (50% or greater) or occlusion. Skeleton: Moderate cervical spondylosis with predominantly discogenic degenerative changes from C3 through C7. There is loss of intervertebral disc space height and endplate marginal osteophytes. No high-grade bony canal stenosis. Uncovertebral  and facet hypertrophy encroaches on the neural foramen bilaterally from C3 through C7. Other neck: 6 mm nodule in the right lobe of the thyroid gland. Dental disease with periapical cysts, absent crowns, and dental caries. Upper  chest: Negative. Review of the MIP images confirms the above findings CTA HEAD FINDINGS Anterior circulation: No significant stenosis, proximal occlusion, aneurysm, or vascular malformation. Posterior circulation: No significant stenosis, proximal occlusion, aneurysm, or vascular malformation. Venous sinuses: As permitted by contrast timing, patent. Anatomic variants: Complete circle-of-Willis. Delayed phase: No abnormal intracranial enhancement. Review of the MIP images confirms the above findings CT Brain Perfusion Findings: CBF (<30%) Volume: 84mL Perfusion (Tmax>6.0s) volume: 43mL Mismatch Volume: 31mL Infarction Location:Not applicable. IMPRESSION: 1. Normal CT perfusion head. 2. Patent carotid and vertebral arteries. No dissection, aneurysm, or hemodynamically significant stenosis utilizing NASCET criteria. 3. Patent anterior and posterior intracranial circulation. No large vessel occlusion, aneurysm, or significant stenosis. These results were called by telephone at the time of interpretation on 01/23/2018 at 11:15 pm to Dr. Thurnell Garbe, who verbally acknowledged these results. Electronically Signed   By: Kristine Garbe M.D.   On: 01/23/2018 23:19   Dg Chest 2 View  Result Date: 01/24/2018 CLINICAL DATA:  57 y/o F; left-sided weakness with sudden onset chest pain and syncopal episode. EXAM: CHEST - 2 VIEW COMPARISON:  None. FINDINGS: The heart size and mediastinal contours are within normal limits. Both lungs are clear. The visualized skeletal structures are unremarkable. IMPRESSION: No acute pulmonary process identified. Electronically Signed   By: Kristine Garbe M.D.   On: 01/24/2018 00:17   Ct Angio Neck W And/or Wo Contrast  Result Date: 01/23/2018 CLINICAL  DATA:  57 y/o F; left-sided weakness with onset earlier today. Further evaluation of stroke. EXAM: CT ANGIOGRAPHY HEAD AND NECK CT PERFUSION BRAIN TECHNIQUE: Multidetector CT imaging of the head and neck was performed using the standard protocol during bolus administration of intravenous contrast. Multiplanar CT image reconstructions and MIPs were obtained to evaluate the vascular anatomy. Carotid stenosis measurements (when applicable) are obtained utilizing NASCET criteria, using the distal internal carotid diameter as the denominator. Multiphase CT imaging of the brain was performed following IV bolus contrast injection. Subsequent parametric perfusion maps were calculated using RAPID software. CONTRAST:  129mL ISOVUE-370 IOPAMIDOL (ISOVUE-370) INJECTION 76% COMPARISON:  01/23/2018 CT head. FINDINGS: CTA NECK FINDINGS Aortic arch: Standard branching. Imaged portion shows no evidence of aneurysm or dissection. No significant stenosis of the major arch vessel origins. Right carotid system: No evidence of dissection, stenosis (50% or greater) or occlusion. Left carotid system: No evidence of dissection, stenosis (50% or greater) or occlusion. Vertebral arteries: Right dominant. No evidence of dissection, stenosis (50% or greater) or occlusion. Skeleton: Moderate cervical spondylosis with predominantly discogenic degenerative changes from C3 through C7. There is loss of intervertebral disc space height and endplate marginal osteophytes. No high-grade bony canal stenosis. Uncovertebral and facet hypertrophy encroaches on the neural foramen bilaterally from C3 through C7. Other neck: 6 mm nodule in the right lobe of the thyroid gland. Dental disease with periapical cysts, absent crowns, and dental caries. Upper chest: Negative. Review of the MIP images confirms the above findings CTA HEAD FINDINGS Anterior circulation: No significant stenosis, proximal occlusion, aneurysm, or vascular malformation. Posterior  circulation: No significant stenosis, proximal occlusion, aneurysm, or vascular malformation. Venous sinuses: As permitted by contrast timing, patent. Anatomic variants: Complete circle-of-Willis. Delayed phase: No abnormal intracranial enhancement. Review of the MIP images confirms the above findings CT Brain Perfusion Findings: CBF (<30%) Volume: 31mL Perfusion (Tmax>6.0s) volume: 46mL Mismatch Volume: 3mL Infarction Location:Not applicable. IMPRESSION: 1. Normal CT perfusion head. 2. Patent carotid and vertebral arteries. No dissection, aneurysm, or hemodynamically significant stenosis utilizing NASCET criteria. 3. Patent anterior and  posterior intracranial circulation. No large vessel occlusion, aneurysm, or significant stenosis. These results were called by telephone at the time of interpretation on 01/23/2018 at 11:15 pm to Dr. Thurnell Garbe, who verbally acknowledged these results. Electronically Signed   By: Kristine Garbe M.D.   On: 01/23/2018 23:19   Mr Brain Wo Contrast  Result Date: 01/24/2018 CLINICAL DATA:  LEFT-sided weakness. Follow up code stroke. History of hypertension. EXAM: MRI HEAD WITHOUT CONTRAST TECHNIQUE: Multiplanar, multiecho pulse sequences of the brain and surrounding structures were obtained without intravenous contrast. COMPARISON:  CT HEAD January 23, 2018. FINDINGS: INTRACRANIAL CONTENTS: No reduced diffusion to suggest acute ischemia. No susceptibility artifact to suggest hemorrhage. The ventricles and sulci are normal for patient's age. LEFT inferior basal ganglia perivascular space, normal variant. A few punctate supratentorial white matter FLAIR T2 hyperintensities, nonspecific and normal for age. No suspicious parenchymal signal, masses, mass effect. No abnormal extra-axial fluid collections. No extra-axial masses. VASCULAR: Normal major intracranial vascular flow voids present at skull base. SKULL AND UPPER CERVICAL SPINE: No abnormal sellar expansion. No suspicious  calvarial bone marrow signal. Severe included cervical spondylosis. Craniocervical junction maintained. SINUSES/ORBITS: The mastoid air-cells and included paranasal sinuses are well-aerated.The included ocular globes and orbital contents are non-suspicious. OTHER: None. IMPRESSION: Negative noncontrast MRI head. Electronically Signed   By: Elon Alas M.D.   On: 01/24/2018 13:37   Ct Cerebral Perfusion W Contrast  Result Date: 01/23/2018 CLINICAL DATA:  57 y/o F; left-sided weakness with onset earlier today. Further evaluation of stroke. EXAM: CT ANGIOGRAPHY HEAD AND NECK CT PERFUSION BRAIN TECHNIQUE: Multidetector CT imaging of the head and neck was performed using the standard protocol during bolus administration of intravenous contrast. Multiplanar CT image reconstructions and MIPs were obtained to evaluate the vascular anatomy. Carotid stenosis measurements (when applicable) are obtained utilizing NASCET criteria, using the distal internal carotid diameter as the denominator. Multiphase CT imaging of the brain was performed following IV bolus contrast injection. Subsequent parametric perfusion maps were calculated using RAPID software. CONTRAST:  127mL ISOVUE-370 IOPAMIDOL (ISOVUE-370) INJECTION 76% COMPARISON:  01/23/2018 CT head. FINDINGS: CTA NECK FINDINGS Aortic arch: Standard branching. Imaged portion shows no evidence of aneurysm or dissection. No significant stenosis of the major arch vessel origins. Right carotid system: No evidence of dissection, stenosis (50% or greater) or occlusion. Left carotid system: No evidence of dissection, stenosis (50% or greater) or occlusion. Vertebral arteries: Right dominant. No evidence of dissection, stenosis (50% or greater) or occlusion. Skeleton: Moderate cervical spondylosis with predominantly discogenic degenerative changes from C3 through C7. There is loss of intervertebral disc space height and endplate marginal osteophytes. No high-grade bony canal  stenosis. Uncovertebral and facet hypertrophy encroaches on the neural foramen bilaterally from C3 through C7. Other neck: 6 mm nodule in the right lobe of the thyroid gland. Dental disease with periapical cysts, absent crowns, and dental caries. Upper chest: Negative. Review of the MIP images confirms the above findings CTA HEAD FINDINGS Anterior circulation: No significant stenosis, proximal occlusion, aneurysm, or vascular malformation. Posterior circulation: No significant stenosis, proximal occlusion, aneurysm, or vascular malformation. Venous sinuses: As permitted by contrast timing, patent. Anatomic variants: Complete circle-of-Willis. Delayed phase: No abnormal intracranial enhancement. Review of the MIP images confirms the above findings CT Brain Perfusion Findings: CBF (<30%) Volume: 87mL Perfusion (Tmax>6.0s) volume: 65mL Mismatch Volume: 47mL Infarction Location:Not applicable. IMPRESSION: 1. Normal CT perfusion head. 2. Patent carotid and vertebral arteries. No dissection, aneurysm, or hemodynamically significant stenosis utilizing NASCET criteria. 3. Patent anterior and  posterior intracranial circulation. No large vessel occlusion, aneurysm, or significant stenosis. These results were called by telephone at the time of interpretation on 01/23/2018 at 11:15 pm to Dr. Thurnell Garbe, who verbally acknowledged these results. Electronically Signed   By: Kristine Garbe M.D.   On: 01/23/2018 23:19    EKG: Independently reviewed.  Assessment/Plan Active Problems:   TIA (transient ischemic attack)   Essential hypertension   Depression   Hypokalemia   AKI (acute kidney injury) ([place])   Chest pain-syncope, with possible left arm weakness after syncopal episode.  Troponin is negative.  EKG shows sinus rhythm, possible LVH.  Teleneurology consider the patient not a candidate for TPA, as the symptoms resolved.  CT Angie of the head and neck are unremarkable.  She was transferred from any pain to hospital  to come hospital for MRI, but due to elevated Cr could not perform, now normalized,  CT Angio of the head and neck are unremarkable.  Patient was transferred here for MRI with negative results   Telemetry observation 2D echo pending  Check lipid panel and A1C Repeat EKG in a.m. Continue aspirin daily   Leukocytes in urine, unclear if there is a relationship between these findings, and her syncopal episode.  She is afebrile, white count is normal.  No antibiotics are indicated at this time. Check urine cultures    Acute Kidney Injury likely due to diuretics Cr was 1.67-> 0.99 w normal GF  Lab Results  Component Value Date   CREATININE 0.99 01/24/2018   CREATININE 1.67 (H) 01/23/2018   IVF BMET in am      Hypertension BP (!) 121/96 (BP Location: Left Arm)   Pulse (!) 57   Allow permissive hypertension for now, may resume meds  in am  with hydralazine for blood pressure greater than 210/110  Depression Continue home Lexapro  Hypokalemia, may be due to diuretics. 3.1 on admission, receiving oral replenishment  Check magnesium Check BMET in am    Hyperlipidemia Continue home statins LIpid panel pending    DVT prophylaxis:  Lovenox Code Status:    FUll code  Family Communication:  Discussed with patient Disposition Plan: Expect patient to be discharged to home after condition improves Consults called:    Teleneurology per EDP Admission status:  Tele Obs    Sharene Butters, PA-C [facility]   Amion text  [phone]

## 2018-01-24 NOTE — H&P (Signed)
TRH H&P    Patient Demographics:    Wendy Leblanc, is a 57 y.o. female  MRN: [id number]  DOB - July 06, 1960  Admit Date - 01/23/2018  Referring MD/NP/PA: Dr. Thurnell Garbe  Outpatient Primary MD for the patient is Patient, No Pcp Per  Patient coming from: Home  Chief complaint-chest pain   HPI:    Wendy Leblanc  is a 57 y.o. female, with history of stroke came to ED with chief complaints of chest pain and syncope.  Patient says that around 4 PM she experienced sudden onset of central chest pain and then passed out.  Patient says that she was out for a few minutes when she woke up she had left-sided weakness.  EMS was called.  In route to hospital the weakness improved. In the ED code stroke was called and she was evaluated by [facility] neurology, patient was ruled out TPA candidate per neurology. Patient has allergy to contrast media, was given pretreatment and CTA head and neck was obtained.  Which was unremarkable, CT cerebral perfusion was unremarkable. Patient's neurological symptoms have completely resolved.  She is back to baseline. Complains of minimal chest pain. Denies nausea vomiting or diarrhea. Denies fever or dysuria.    Review of systems:    In addition to the HPI above,    All other systems reviewed and are negative.   With Past History of the following :    Past Medical History:  Diagnosis Date  . Arthritis   . Hypertension       Past Surgical History:  Procedure Laterality Date  . ABDOMINAL HYSTERECTOMY    . CARPAL TUNNEL RELEASE        Social History:      Social History   Tobacco Use  . Smoking status: Current Every Day Smoker    Packs/day: 0.50    Types: Cigarettes  . Smokeless tobacco: Never Used  Substance Use Topics  . Alcohol use: Not on file       Family History :   Positive family history of stroke   Home Medications:   Prior to Admission medications     Medication Sig Start Date End Date Taking? Authorizing Provider  amLODipine (NORVASC) 10 MG tablet Take 10 mg by mouth daily. 12/29/17  Yes [provider]  aspirin EC 81 MG tablet Take 81 mg by mouth daily.   Yes [provider]  atorvastatin (LIPITOR) 20 MG tablet Take 20 mg by mouth daily.   Yes [provider]  escitalopram (LEXAPRO) 10 MG tablet Take 10 mg by mouth daily.   Yes [provider]  lisinopril-hydrochlorothiazide (PRINZIDE,ZESTORETIC) 20-25 MG tablet Take 0.5 tablets by mouth daily.  12/29/17  Yes [provider]  topiramate (TOPAMAX) 25 MG tablet Take 25 mg by mouth 2 (two) times daily. 12/29/17  Yes [provider]     Allergies:     Allergies  Allergen Reactions  . Penicillins     ANAPHYLAXIS   . Contrast Media [Iodinated Diagnostic Agents]     hives  . Sulfur  HIVES      Physical Exam:   Vitals  Blood pressure 118/75, pulse 62, temperature 98.8 F (37.1 C), temperature source Oral, resp. rate 18, height '5\' 3"'$  (1.6 m), weight 76 kg, SpO2 97 %.  1.  General: Appears in no acute distress  2. Psychiatric:  Intact judgement and  insight, awake alert, oriented x 3.  3. Neurologic: No focal neurological deficits, all cranial nerves intact.Strength 5/5 all 4 extremities, sensation intact all 4 extremities, plantars down going.  4. Eyes :  anicteric sclerae, moist conjunctivae with no lid lag. PERRLA.  5. ENMT:  Oropharynx clear with moist mucous membranes and good dentition  6. Neck:  supple, no cervical lymphadenopathy appriciated, No thyromegaly  7. Respiratory : Normal respiratory effort, good air movement bilaterally,clear to  auscultation bilaterally  8. Cardiovascular : RRR, no gallops, rubs or murmurs, no leg edema.  Positive chest wall tenderness to palpation  9. Gastrointestinal:  Positive bowel sounds, abdomen soft, non-tender to palpation,no hepatosplenomegaly, no rigidity or  guarding       10. Skin:  No cyanosis, normal texture and turgor, no rash, lesions or ulcers  11.Musculoskeletal:  Good muscle tone,  joints appear normal , no effusions,  normal range of motion    Data Review:    CBC Recent Labs  Lab 01/23/18 1648  WBC 5.9  HGB 14.2  HCT 43.0  PLT 220  MCV 88.3  MCH 29.2  MCHC 33.0  RDW 12.7  LYMPHSABS 2.2  MONOABS 0.5  EOSABS 0.1  BASOSABS 0.0   ------------------------------------------------------------------------------------------------------------------  Results for orders placed or performed during the hospital encounter of 01/23/18 (from the past 48 hour(s))  Ethanol     Status: None   Collection Time: 01/23/18  4:48 PM  Result Value Ref Range   Alcohol, Ethyl (B) <10 <10 mg/dL    Comment: (NOTE) Lowest detectable limit for serum alcohol is 10 mg/dL. For medical purposes only. Performed at [facility], [address] [place], [place] [address]   Protime-INR     Status: None   Collection Time: 01/23/18  4:48 PM  Result Value Ref Range   Prothrombin Time 12.6 11.4 - 15.2 seconds   INR 0.95     Comment: Performed at [facility], [address] [place], [place] [address]  APTT     Status: None   Collection Time: 01/23/18  4:48 PM  Result Value Ref Range   aPTT 27 24 - 36 seconds    Comment: Performed at [facility], [address], [place], [place] [address]  CBC     Status: None   Collection Time: 01/23/18  4:48 PM  Result Value Ref Range   WBC 5.9 4.0 - 10.5 K/uL   RBC 4.87 3.87 - 5.11 MIL/uL   Hemoglobin 14.2 12.0 - 15.0 g/dL   HCT 43.0 36.0 - 46.0 %   MCV 88.3 78.0 - 100.0 fL   MCH 29.2 26.0 - 34.0 pg   MCHC 33.0 30.0 - 36.0 g/dL   RDW 12.7 11.5 - 15.5 %   Platelets 220 150 - 400 K/uL    Comment: Performed at [facility], [address] [place], [place] [address]  Differential     Status: None   Collection Time: 01/23/18  4:48 PM  Result Value Ref Range   Neutrophils Relative % 54 %   Neutro Abs 3.2 1.7  - 7.7 K/uL   Lymphocytes Relative 37 %   Lymphs Abs 2.2 0.7 - 4.0 K/uL  Monocytes Relative 8 %   Monocytes Absolute 0.5 0.1 - 1.0 K/uL   Eosinophils Relative 1 %   Eosinophils Absolute 0.1 0.0 - 0.7 K/uL   Basophils Relative 0 %   Basophils Absolute 0.0 0.0 - 0.1 K/uL    Comment: Performed at [facility], [address], [place], [place] [address]  Comprehensive metabolic panel     Status: Abnormal   Collection Time: 01/23/18  4:48 PM  Result Value Ref Range   Sodium 139 135 - 145 mmol/L   Potassium 3.1 (L) 3.5 - 5.1 mmol/L   Chloride 100 98 - 111 mmol/L   CO2 27 22 - 32 mmol/L   Glucose, Bld 169 (H) 70 - 99 mg/dL   BUN 20 6 - 20 mg/dL   Creatinine, Ser 1.67 (H) 0.44 - 1.00 mg/dL   Calcium 9.4 8.9 - 10.3 mg/dL   Total Protein 7.2 6.5 - 8.1 g/dL   Albumin 4.2 3.5 - 5.0 g/dL   AST 39 15 - 41 U/L   ALT 14 0 - 44 U/L   Alkaline Phosphatase 85 38 - 126 U/L   Total Bilirubin 0.8 0.3 - 1.2 mg/dL   GFR calc non Af Amer 33 (L) >60 mL/min   GFR calc Af Amer 38 (L) >60 mL/min    Comment: (NOTE) The eGFR has been calculated using the CKD EPI equation. This calculation has not been validated in all clinical situations. eGFR's persistently <60 mL/min signify possible Chronic Kidney Disease.    Anion gap 12 5 - 15    Comment: Performed at [facility], [address] [place], [place] [address]  Urine rapid drug screen (hosp performed)     Status: None   Collection Time: 01/23/18  7:31 PM  Result Value Ref Range   Opiates NONE DETECTED NONE DETECTED   Cocaine NONE DETECTED NONE DETECTED   Benzodiazepines NONE DETECTED NONE DETECTED   Amphetamines NONE DETECTED NONE DETECTED   Tetrahydrocannabinol NONE DETECTED NONE DETECTED   Barbiturates NONE DETECTED NONE DETECTED    Comment: (NOTE) DRUG SCREEN FOR MEDICAL PURPOSES ONLY.  IF CONFIRMATION IS NEEDED FOR ANY PURPOSE, NOTIFY LAB WITHIN 5 DAYS. LOWEST DETECTABLE LIMITS FOR URINE DRUG SCREEN Drug Class                     Cutoff  (ng/mL) Amphetamine and metabolites    1000 Barbiturate and metabolites    200 Benzodiazepine                 219 Tricyclics and metabolites     300 Opiates and metabolites        300 Cocaine and metabolites        300 THC                            50 Performed at [facility], [address], [place], [place] [address]   Urinalysis, Routine w reflex microscopic     Status: Abnormal   Collection Time: 01/23/18  7:31 PM  Result Value Ref Range   Color, Urine YELLOW YELLOW   APPearance HAZY (A) CLEAR   Specific Gravity, Urine 1.016 1.005 - 1.030   pH 5.0 5.0 - 8.0   Glucose, UA NEGATIVE NEGATIVE mg/dL   Hgb urine dipstick SMALL (A) NEGATIVE   Bilirubin Urine NEGATIVE NEGATIVE   Ketones, ur NEGATIVE NEGATIVE mg/dL   Protein, ur NEGATIVE NEGATIVE mg/dL   Nitrite NEGATIVE NEGATIVE  Leukocytes, UA LARGE (A) NEGATIVE   RBC / HPF 21-50 0 - 5 RBC/hpf   WBC, UA 21-50 0 - 5 WBC/hpf   Bacteria, UA NONE SEEN NONE SEEN   Squamous Epithelial / LPF 0-5 0 - 5   Mucus PRESENT    Hyaline Casts, UA PRESENT     Comment: Performed at [facility], [address], [place], [place] [address]  I-stat troponin, ED     Status: None   Collection Time: 01/23/18 10:40 PM  Result Value Ref Range   Troponin i, poc 0.00 0.00 - 0.08 ng/mL   Comment 3            Comment: Due to the release kinetics of cTnI, a negative result within the first hours of the onset of symptoms does not rule out myocardial infarction with certainty. If myocardial infarction is still suspected, repeat the test at appropriate intervals.     Chemistries  Recent Labs  Lab 01/23/18 1648  NA 139  K 3.1*  CL 100  CO2 27  GLUCOSE 169*  BUN 20  CREATININE 1.67*  CALCIUM 9.4  AST 39  ALT 14  ALKPHOS 85  BILITOT 0.8    ------------------------------------------------------------------------------------------------------------------  ------------------------------------------------------------------------------------------------------------------ GFR: Estimated Creatinine Clearance: 35.8 mL/min (A) (by C-G formula based on SCr of 1.67 mg/dL (H)). Liver Function Tests: Recent Labs  Lab 01/23/18 1648  AST 39  ALT 14  ALKPHOS 85  BILITOT 0.8  PROT 7.2  ALBUMIN 4.2   No results for input(s): LIPASE, AMYLASE in the last 168 hours. No results for input(s): AMMONIA in the last 168 hours. Coagulation Profile: Recent Labs  Lab 01/23/18 1648  INR 0.95   Cardiac Enzymes: No results for input(s): CKTOTAL, CKMB, CKMBINDEX, TROPONINI in the last 168 hours. BNP (last 3 results) No results for input(s): PROBNP in the last 8760 hours. HbA1C: No results for input(s): HGBA1C in the last 72 hours. CBG: No results for input(s): GLUCAP in the last 168 hours. Lipid Profile: No results for input(s): CHOL, HDL, LDLCALC, TRIG, CHOLHDL, LDLDIRECT in the last 72 hours. Thyroid Function Tests: No results for input(s): TSH, T4TOTAL, FREET4, T3FREE, THYROIDAB in the last 72 hours. Anemia Panel: No results for input(s): VITAMINB12, FOLATE, FERRITIN, TIBC, IRON, RETICCTPCT in the last 72 hours.  --------------------------------------------------------------------------------------------------------------- Urine analysis:    Component Value Date/Time   COLORURINE YELLOW 01/23/2018 1931   APPEARANCEUR HAZY (A) 01/23/2018 1931   LABSPEC 1.016 01/23/2018 1931   PHURINE 5.0 01/23/2018 1931   GLUCOSEU NEGATIVE 01/23/2018 1931   HGBUR SMALL (A) 01/23/2018 1931   BILIRUBINUR NEGATIVE 01/23/2018 1931   KETONESUR NEGATIVE 01/23/2018 1931   PROTEINUR NEGATIVE 01/23/2018 1931   NITRITE NEGATIVE 01/23/2018 1931   LEUKOCYTESUR LARGE (A) 01/23/2018 1931      Imaging Results:    Ct Angio Head W Or Wo  Contrast  Result Date: 01/23/2018 CLINICAL DATA:  57 y/o F; left-sided weakness with onset earlier today. Further evaluation of stroke. EXAM: CT ANGIOGRAPHY HEAD AND NECK CT PERFUSION BRAIN TECHNIQUE: Multidetector CT imaging of the head and neck was performed using the standard protocol during bolus administration of intravenous contrast. Multiplanar CT image reconstructions and MIPs were obtained to evaluate the vascular anatomy. Carotid stenosis measurements (when applicable) are obtained utilizing NASCET criteria, using the distal internal carotid diameter as the denominator. Multiphase CT imaging of the brain was performed following IV bolus contrast injection. Subsequent parametric perfusion maps were calculated using RAPID software. CONTRAST:  152m ISOVUE-370 IOPAMIDOL (ISOVUE-370) INJECTION  76% COMPARISON:  01/23/2018 CT head. FINDINGS: CTA NECK FINDINGS Aortic arch: Standard branching. Imaged portion shows no evidence of aneurysm or dissection. No significant stenosis of the major arch vessel origins. Right carotid system: No evidence of dissection, stenosis (50% or greater) or occlusion. Left carotid system: No evidence of dissection, stenosis (50% or greater) or occlusion. Vertebral arteries: Right dominant. No evidence of dissection, stenosis (50% or greater) or occlusion. Skeleton: Moderate cervical spondylosis with predominantly discogenic degenerative changes from C3 through C7. There is loss of intervertebral disc space height and endplate marginal osteophytes. No high-grade bony canal stenosis. Uncovertebral and facet hypertrophy encroaches on the neural foramen bilaterally from C3 through C7. Other neck: 6 mm nodule in the right lobe of the thyroid gland. Dental disease with periapical cysts, absent crowns, and dental caries. Upper chest: Negative. Review of the MIP images confirms the above findings CTA HEAD FINDINGS Anterior circulation: No significant stenosis, proximal occlusion, aneurysm,  or vascular malformation. Posterior circulation: No significant stenosis, proximal occlusion, aneurysm, or vascular malformation. Venous sinuses: As permitted by contrast timing, patent. Anatomic variants: Complete circle-of-Willis. Delayed phase: No abnormal intracranial enhancement. Review of the MIP images confirms the above findings CT Brain Perfusion Findings: CBF (<30%) Volume: 69m Perfusion (Tmax>6.0s) volume: 039mMismatch Volume: 27m32mnfarction Location:Not applicable. IMPRESSION: 1. Normal CT perfusion head. 2. Patent carotid and vertebral arteries. No dissection, aneurysm, or hemodynamically significant stenosis utilizing NASCET criteria. 3. Patent anterior and posterior intracranial circulation. No large vessel occlusion, aneurysm, or significant stenosis. These results were called by telephone at the time of interpretation on 01/23/2018 at 11:15 pm to Dr. McMThurnell Garbeho verbally acknowledged these results. Electronically Signed   By: LanKristine GarbeD.   On: 01/23/2018 23:19   Dg Chest 2 View  Result Date: 01/24/2018 CLINICAL DATA:  [age]o F; left-sided weakness with sudden onset chest pain and syncopal episode. EXAM: CHEST - 2 VIEW COMPARISON:  None. FINDINGS: The heart size and mediastinal contours are within normal limits. Both lungs are clear. The visualized skeletal structures are unremarkable. IMPRESSION: No acute pulmonary process identified. Electronically Signed   By: LanKristine GarbeD.   On: 01/24/2018 00:17   Ct Angio Neck W And/or Wo Contrast  Result Date: 01/23/2018 CLINICAL DATA:  57 22o F; left-sided weakness with onset earlier today. Further evaluation of stroke. EXAM: CT ANGIOGRAPHY HEAD AND NECK CT PERFUSION BRAIN TECHNIQUE: Multidetector CT imaging of the head and neck was performed using the standard protocol during bolus administration of intravenous contrast. Multiplanar CT image reconstructions and MIPs were obtained to evaluate the vascular anatomy.  Carotid stenosis measurements (when applicable) are obtained utilizing NASCET criteria, using the distal internal carotid diameter as the denominator. Multiphase CT imaging of the brain was performed following IV bolus contrast injection. Subsequent parametric perfusion maps were calculated using RAPID software. CONTRAST:  1027m79mOVUE-370 IOPAMIDOL (ISOVUE-370) INJECTION 76% COMPARISON:  01/23/2018 CT head. FINDINGS: CTA NECK FINDINGS Aortic arch: Standard branching. Imaged portion shows no evidence of aneurysm or dissection. No significant stenosis of the major arch vessel origins. Right carotid system: No evidence of dissection, stenosis (50% or greater) or occlusion. Left carotid system: No evidence of dissection, stenosis (50% or greater) or occlusion. Vertebral arteries: Right dominant. No evidence of dissection, stenosis (50% or greater) or occlusion. Skeleton: Moderate cervical spondylosis with predominantly discogenic degenerative changes from C3 through C7. There is loss of intervertebral disc space height and endplate marginal osteophytes. No high-grade bony canal stenosis. Uncovertebral and facet hypertrophy encroaches  on the neural foramen bilaterally from C3 through C7. Other neck: 6 mm nodule in the right lobe of the thyroid gland. Dental disease with periapical cysts, absent crowns, and dental caries. Upper chest: Negative. Review of the MIP images confirms the above findings CTA HEAD FINDINGS Anterior circulation: No significant stenosis, proximal occlusion, aneurysm, or vascular malformation. Posterior circulation: No significant stenosis, proximal occlusion, aneurysm, or vascular malformation. Venous sinuses: As permitted by contrast timing, patent. Anatomic variants: Complete circle-of-Willis. Delayed phase: No abnormal intracranial enhancement. Review of the MIP images confirms the above findings CT Brain Perfusion Findings: CBF (<30%) Volume: 79m Perfusion (Tmax>6.0s) volume: 047mMismatch  Volume: 67m72mnfarction Location:Not applicable. IMPRESSION: 1. Normal CT perfusion head. 2. Patent carotid and vertebral arteries. No dissection, aneurysm, or hemodynamically significant stenosis utilizing NASCET criteria. 3. Patent anterior and posterior intracranial circulation. No large vessel occlusion, aneurysm, or significant stenosis. These results were called by telephone at the time of interpretation on 01/23/2018 at 11:15 pm to Dr. McMThurnell Garbeho verbally acknowledged these results. Electronically Signed   By: LanKristine GarbeD.   On: 01/23/2018 23:19   Ct Cerebral Perfusion W Contrast  Result Date: 01/23/2018 CLINICAL DATA:  [age]o F; left-sided weakness with onset earlier today. Further evaluation of stroke. EXAM: CT ANGIOGRAPHY HEAD AND NECK CT PERFUSION BRAIN TECHNIQUE: Multidetector CT imaging of the head and neck was performed using the standard protocol during bolus administration of intravenous contrast. Multiplanar CT image reconstructions and MIPs were obtained to evaluate the vascular anatomy. Carotid stenosis measurements (when applicable) are obtained utilizing NASCET criteria, using the distal internal carotid diameter as the denominator. Multiphase CT imaging of the brain was performed following IV bolus contrast injection. Subsequent parametric perfusion maps were calculated using RAPID software. CONTRAST:  1067m16mOVUE-370 IOPAMIDOL (ISOVUE-370) INJECTION 76% COMPARISON:  01/23/2018 CT head. FINDINGS: CTA NECK FINDINGS Aortic arch: Standard branching. Imaged portion shows no evidence of aneurysm or dissection. No significant stenosis of the major arch vessel origins. Right carotid system: No evidence of dissection, stenosis (50% or greater) or occlusion. Left carotid system: No evidence of dissection, stenosis (50% or greater) or occlusion. Vertebral arteries: Right dominant. No evidence of dissection, stenosis (50% or greater) or occlusion. Skeleton: Moderate cervical  spondylosis with predominantly discogenic degenerative changes from C3 through C7. There is loss of intervertebral disc space height and endplate marginal osteophytes. No high-grade bony canal stenosis. Uncovertebral and facet hypertrophy encroaches on the neural foramen bilaterally from C3 through C7. Other neck: 6 mm nodule in the right lobe of the thyroid gland. Dental disease with periapical cysts, absent crowns, and dental caries. Upper chest: Negative. Review of the MIP images confirms the above findings CTA HEAD FINDINGS Anterior circulation: No significant stenosis, proximal occlusion, aneurysm, or vascular malformation. Posterior circulation: No significant stenosis, proximal occlusion, aneurysm, or vascular malformation. Venous sinuses: As permitted by contrast timing, patent. Anatomic variants: Complete circle-of-Willis. Delayed phase: No abnormal intracranial enhancement. Review of the MIP images confirms the above findings CT Brain Perfusion Findings: CBF (<30%) Volume: 67mL 64mfusion (Tmax>6.0s) volume: 67mL M77match Volume: 67mL In56mction Location:Not applicable. IMPRESSION: 1. Normal CT perfusion head. 2. Patent carotid and vertebral arteries. No dissection, aneurysm, or hemodynamically significant stenosis utilizing NASCET criteria. 3. Patent anterior and posterior intracranial circulation. No large vessel occlusion, aneurysm, or significant stenosis. These results were called by telephone at the time of interpretation on 01/23/2018 at 11:15 pm to Dr. McManusThurnell Garbeerbally acknowledged these results. Electronically Signed   By: Lance  Edgardo Roys  On: 01/23/2018 23:19    My personal review of EKG: Rhythm NSR, LVH   Assessment & Plan:    Active Problems:   TIA (transient ischemic attack)   1. TIA-patient's symptoms of left-sided weakness have resolved, CT head and neck is negative for stenosis or dissection.  Will transfer patient to [facility] for MRI brain.  Further work-up for  stroke including echocardiogram, lipid profile, A1c.  2. Chest pain/syncope-resolved at this time.  Troponin is negative in the ED.  CTA chest was ordered but could not be done due to elevated creatinine.  Consider performing this test if creatinine remains stable in a.m.  Follow serial troponin.  3. Acute kidney injury-creatinine is 1.67, unknown baseline ,start IV normal saline at 100 ml/h.  Follow BMP in am.  4. Hypokalemia-replace potassium and check BMP again in am  5. Hypertension- will hold the antihypertensive medications for permissive hypertension   DVT Prophylaxis-   Lovenox   AM Labs Ordered, also please review Full Orders  Family Communication: Admission, patients condition and plan of care including tests being ordered have been discussed with the patient  who indicate understanding and agree with the plan and Code Status.  Code Status: Full code  Admission status: Observation  Time spent in minutes : 60 minutes   Oswald Hillock M.D on 01/24/2018 at 3:32 AM  Between 7am to 7pm - Pager - [phone]. After 7pm go to [email or web address] - password [facility]  [facility] - Office  [phone]

## 2018-01-24 NOTE — ED Notes (Signed)
Patient able to ambulate with assistance to bedside commode. Patient states that she has sensation on her left side and feels like she is getting some of her mobility back. Patient still having left arm and left leg weakness.

## 2018-01-24 NOTE — Plan of Care (Signed)

## 2018-01-24 NOTE — ED Notes (Signed)
Patient able to move her left side.

## 2018-01-25 DIAGNOSIS — G459 Transient cerebral ischemic attack, unspecified: Secondary | ICD-10-CM

## 2018-01-25 LAB — BASIC METABOLIC PANEL
ANION GAP: 4 — AB (ref 5–15)
BUN: 12 mg/dL (ref 6–20)
CO2: 28 mmol/L (ref 22–32)
Calcium: 8.3 mg/dL — ABNORMAL LOW (ref 8.9–10.3)
Chloride: 110 mmol/L (ref 98–111)
Creatinine, Ser: 0.96 mg/dL (ref 0.44–1.00)
Glucose, Bld: 91 mg/dL (ref 70–99)
POTASSIUM: 4.5 mmol/L (ref 3.5–5.1)
SODIUM: 142 mmol/L (ref 135–145)

## 2018-01-25 LAB — CBC
HCT: 37.9 % (ref 36.0–46.0)
Hemoglobin: 11.8 g/dL — ABNORMAL LOW (ref 12.0–15.0)
MCH: 28.3 pg (ref 26.0–34.0)
MCHC: 31.1 g/dL (ref 30.0–36.0)
MCV: 90.9 fL (ref 78.0–100.0)
Platelets: 185 10*3/uL (ref 150–400)
RBC: 4.17 MIL/uL (ref 3.87–5.11)
RDW: 12.8 % (ref 11.5–15.5)
WBC: 5.2 10*3/uL (ref 4.0–10.5)

## 2018-01-25 LAB — URINE CULTURE: Culture: <10000 — AB

## 2018-01-25 LAB — HIV ANTIBODY (ROUTINE TESTING W REFLEX): HIV SCREEN 4TH GENERATION: NONREACTIVE

## 2018-01-25 NOTE — Progress Notes (Signed)
Pt discharge education and instructions completed with pt and sister at bedside. Pt IV and telemetry removed; pt requested for a nicotine patch but per MD; patch is available over the counter. Pt notified; pt discharge home with sister to transport her home. Pt transported off unit via wheelchair with belongings and sister to the side. Delia Heady RN

## 2018-01-25 NOTE — Evaluation (Signed)
Speech Language Pathology Evaluation Patient Details Name: Wendy Leblanc MRN: [id number] DOB: 02/08/1961 Today's Date: 01/25/2018 Time: 6568-1275 SLP Time Calculation (min) (ACUTE ONLY): 19 min  Problem List:  Patient Active Problem List   Diagnosis Date Noted  . TIA (transient ischemic attack) 01/24/2018  . Essential hypertension 01/24/2018  . Depression 01/24/2018   Past Medical History:  Past Medical History:  Diagnosis Date  . Arthritis   . Hypertension    Past Surgical History:  Past Surgical History:  Procedure Laterality Date  . ABDOMINAL HYSTERECTOMY    . CARPAL TUNNEL RELEASE     HPI:  Wendy Leblanc  is a 57 y.o. female, with history of stroke came to ED with chief complaints of chest pain and syncope.  Patient says that around 4 PM she experienced sudden onset of central chest pain and then passed out.  Patient says that she was out for a few minutes when she woke up she had left-sided weakness.  EMS was called.  In route to hospital the weakness improved. In the ED code stroke was called and she was evaluated by [facility] neurology, patient was ruled out TPA candidate per neurology.   Assessment / Plan / Recommendation Clinical Impression  Patient admitted to hospital with TIA. Her neurological symptoms have returned to normal per her physician, however she feels her language is not at her baseline. She presents with a mild expressive aphasia noticable at the conversational level. Cognitive and langauge assessment found all other areas of assessed to be within normal limits. Recommend outpatient speech and language therapy to address expressive aphasia deficit. Frequency and duration to be determined at next venue.    SLP Assessment  SLP Recommendation/Assessment: All further Speech Lanaguage Pathology  needs can be addressed in the next venue of care SLP Visit Diagnosis: Aphasia (R47.01)    Follow Up Recommendations  Outpatient SLP    Frequency and Duration   TBD         SLP Evaluation Cognition  Overall Cognitive Status: Impaired/Different from baseline Arousal/Alertness: Awake/alert Orientation Level: Oriented X4 Attention: Focused Focused Attention: Appears intact Memory: Appears intact Awareness: Appears intact Problem Solving: Appears intact Safety/Judgment: Appears intact       Comprehension  Auditory Comprehension Overall Auditory Comprehension: Appears within functional limits for tasks assessed Yes/No Questions: Within Functional Limits Commands: Within Functional Limits Conversation: Simple EffectiveTechniques: Pausing;Visual/Gestural cues Visual Recognition/Discrimination Discrimination: Within Function Limits Reading Comprehension Reading Status: Within funtional limits    Expression Expression Primary Mode of Expression: Verbal Verbal Expression Overall Verbal Expression: Impaired Initiation: No impairment Level of Generative/Spontaneous Verbalization: Conversation Repetition: No impairment Naming: No impairment Pragmatics: No impairment Effective Techniques: Semantic cues;Phonemic cues Written Expression Dominant Hand: Right   Oral / Motor  Oral Motor/Sensory Function Overall Oral Motor/Sensory Function: Within functional limits Motor Speech Overall Motor Speech: Appears within functional limits for tasks assessed Respiration: Within functional limits Phonation: Normal Resonance: Within functional limits Articulation: Within functional limitis Intelligibility: Intelligible Motor Planning: Witnin functional limits Motor Speech Errors: Not applicable Effective Techniques: Pause   GO                    Charlynne Cousins Krystopher Kuenzel, MA, CCC-SLP 01/25/2018 11:30 AM

## 2018-01-25 NOTE — Progress Notes (Addendum)
OT Screen Note  Patient Details Name: Wendy Leblanc MRN: [id number] DOB: June 19, 1960   Cancelled Treatment:    Reason Eval/Treat Not Completed: OT screened, no needs identified, will sign off. (Pt near baseline function per RN and PT. Checking with pt and she states she feels back to baseline and daughter will be available at home.)   [place], OTR/L Acute Rehab Pager: [phone] Office: [phone] 01/25/2018, 2:02 PM

## 2018-01-25 NOTE — Evaluation (Signed)
Physical Therapy Evaluation Patient Details Name: Wendy Leblanc MRN: [id number] DOB: 10-22-1960 Today's Date: 01/25/2018   History of Present Illness  Patient is a 57 y/o female who presents with left sided weakness/numbness, CP and syncopal episode. Brain MRI, CT-unremarkable. Admitted for possible TIA. PMH includes HTN.  Clinical Impression  Patient presents with left sided weakness/numbness and impaired balance s/p above. Tolerated gait training and stair training with supervision-Min guard for safety. Demonstrates some cognitive deficits as well - difficulty following multi step commands consistently and slow processing. Pt independent PTA and lives with sister who works. Would benefit from HHPT for safety evaluation. Will follow acutely to maximize independence and mobility prior to return home.    Follow Up Recommendations Home health PT;Supervision - Intermittent    Equipment Recommendations  None recommended by PT    Recommendations for Other Services       Precautions / Restrictions Precautions Precautions: Fall Precaution Comments: soft BP Restrictions Weight Bearing Restrictions: No      Mobility  Bed Mobility Overal bed mobility: Modified Independent             General bed mobility comments: No assist needed.  Transfers Overall transfer level: Modified independent Equipment used: None             General transfer comment: Stood from EOB and from toilet without difficulty.   Ambulation/Gait Ambulation/Gait assistance: Min guard Gait Distance (Feet): 150 Feet Assistive device: None Gait Pattern/deviations: Step-through pattern;Decreased stride length;Decreased weight shift to left Gait velocity: decreased   General Gait Details: Slow, mildly unsteady gait with some left knee instability but no buckling- improved with increased distance.   Stairs Stairs: Yes Stairs assistance: Supervision;Min guard Stair Management: Step to pattern;Alternating  pattern;Two rails Number of Stairs: 3(+ 2 steps x2 bout) General stair comments: Cues for technique/safety.   Wheelchair Mobility    Modified Rankin (Stroke Patients Only) Modified Rankin (Stroke Patients Only) Pre-Morbid Rankin Score: Slight disability Modified Rankin: Moderate disability     Balance Overall balance assessment: Needs assistance Sitting-balance support: Feet supported;No upper extremity supported Sitting balance-Leahy Scale: Good     Standing balance support: During functional activity Standing balance-Leahy Scale: Fair                               Pertinent Vitals/Pain Pain Assessment: No/denies pain    Home Living Family/patient expects to be discharged to:: Private residence Living Arrangements: Other relatives(sister who works) Available Help at Discharge: Family;Available PRN/intermittently Type of Home: House Home Access: Stairs to enter Entrance Stairs-Rails: Right Entrance Stairs-Number of Steps: 12 Home Layout: Two level Home Equipment: None      Prior Function Level of Independence: Independent         Comments: Does not drive.      Hand Dominance   Dominant Hand: Right    Extremity/Trunk Assessment   Upper Extremity Assessment Upper Extremity Assessment: Defer to OT evaluation;LUE deficits/detail LUE Sensation: decreased light touch    Lower Extremity Assessment Lower Extremity Assessment: LLE deficits/detail LLE Deficits / Details: Grossly ~4/5 throughout LLE Sensation: decreased light touch    Cervical / Trunk Assessment Cervical / Trunk Assessment: Normal  Communication   Communication: Expressive difficulties  Cognition Arousal/Alertness: Awake/alert Behavior During Therapy: WFL for tasks assessed/performed Overall Cognitive Status: Impaired/Different from baseline Area of Impairment: Following commands;Problem solving  Following Commands: Follows multi-step commands  inconsistently     Problem Solving: Slow processing General Comments: Reports some difficulty with speech at times and processing information.      General Comments General comments (skin integrity, edema, etc.): BP soft but improved with mobility. No dizziness.     Exercises     Assessment/Plan    PT Assessment Patient needs continued PT services  PT Problem List Decreased mobility;Decreased balance;Decreased activity tolerance;Decreased cognition;Cardiopulmonary status limiting activity;Decreased strength       PT Treatment Interventions Functional mobility training;Balance training;Patient/family education;Gait training;Therapeutic activities;Stair training;Therapeutic exercise;Neuromuscular re-education;Cognitive remediation;DME instruction    PT Goals (Current goals can be found in the Care Plan section)  Acute Rehab PT Goals Patient Stated Goal: to go home PT Goal Formulation: With patient Time For Goal Achievement: 02/08/18 Potential to Achieve Goals: Good    Frequency Min 4X/week   Barriers to discharge Decreased caregiver support;Inaccessible home environment sister works, stairs to enter home    Co-evaluation               AM-PAC PT "6 Clicks" Daily Activity  Outcome Measure Difficulty turning over in bed (including adjusting bedclothes, sheets and blankets)?: None Difficulty moving from lying on back to sitting on the side of the bed? : None Difficulty sitting down on and standing up from a chair with arms (e.g., wheelchair, bedside commode, etc,.)?: None Help needed moving to and from a bed to chair (including a wheelchair)?: None Help needed walking in hospital room?: A Little Help needed climbing 3-5 steps with a railing? : A Little 6 Click Score: 22    End of Session Equipment Utilized During Treatment: Gait belt Activity Tolerance: Patient tolerated treatment well Patient left: in bed;with call bell/phone within reach;with bed alarm set Nurse  Communication: Mobility status PT Visit Diagnosis: Unsteadiness on feet (R26.81);Muscle weakness (generalized) (M62.81);Hemiplegia and hemiparesis Hemiplegia - Right/Left: Left Hemiplegia - dominant/non-dominant: Non-dominant Hemiplegia - caused by: Unspecified    Time: 1100-1120 PT Time Calculation (min) (ACUTE ONLY): 20 min   Charges:   PT Evaluation $PT Eval Moderate Complexity: 1 Mod          Wray Kearns, PT, DPT Acute Rehabilitation Services Pager [phone] Office [phone]      Marguarite Arbour A Sabra Heck 01/25/2018, 12:19 PM

## 2018-01-25 NOTE — Discharge Summary (Signed)
Physician Discharge Summary  Wendy Leblanc UXL:[id number] DOB: 1961/01/30 DOA: 01/23/2018  PCP: Patient, No Pcp Per  Admit date: 01/23/2018 Discharge date: 01/25/2018  Admitted From: Home Disposition: Home Recommendations for Outpatient Follow-up:  1. Follow up with PCP in 1-2 weeks  Home Health: No Equipment/Devices: None  Discharge Condition: Stable CODE STATUS: Full code Diet recommendation: Heart healthy  Brief/Interim Summary:   Wendy Leblanc  is a 56 y.o. female, with history of stroke came to ED with chief complaints of chest pain and syncope.  Patient says that around 4 PM she experienced sudden onset of central chest pain and then passed out.  Patient says that she was out for a few minutes when she woke up she had left-sided weakness.  EMS was called.  In route to hospital the weakness improved. In the ED code stroke was called and she was evaluated by [facility] neurology, patient was ruled out TPA candidate per neurology. Patient has allergy to contrast media, was given pretreatment and CTA head and neck was obtained.  Which was unremarkable, CT cerebral perfusion was unremarkable. Patient's neurological symptoms have completely resolved.  She is back to baseline. Complains of minimal chest pain. Denies nausea vomiting or diarrhea. Denies fever or dysuria.  Today on the day of discharge her echocardiogram was reviewed and her EF is 60 to 65%.  Her lipid panel is within normal limits.  Her hemoglobin A1c is 5.7.  Her EKG is unremarkable.  Her MRI does not show any acute infarctions.    Patient has reached maximal benefit of hospitalization.  Discharge diagnosis, prognosis, plans, follow-up, medications and treatments discussed with the patient(or responsible party) and is in agreement with the plans as described.  Patient is stable for discharge.  Discharge Diagnoses:  Principal Problem:   TIA (transient ischemic attack) Active Problems:   Essential hypertension    Depression    Discharge Instructions  Discharge Instructions    Call MD for:  extreme fatigue   Complete by:  As directed    Call MD for:  persistant dizziness or light-headedness   Complete by:  As directed    Diet - low sodium heart healthy   Complete by:  As directed    Discharge instructions   Complete by:  As directed    Continue to take aspirin, blood pressure medications and medications for cholesterol as directed.  Follow-up with your primary care physician in the next 2 weeks.   Increase activity slowly   Complete by:  As directed      Allergies as of 01/25/2018      Reactions   Penicillins    ANAPHYLAXIS   Contrast Media [iodinated Diagnostic Agents]    hives   Sulfur    HIVES      Medication List    TAKE these medications   amLODipine 10 MG tablet Commonly known as:  NORVASC Take 10 mg by mouth daily.   aspirin EC 81 MG tablet Take 81 mg by mouth daily.   atorvastatin 20 MG tablet Commonly known as:  LIPITOR Take 20 mg by mouth daily.   escitalopram 10 MG tablet Commonly known as:  LEXAPRO Take 10 mg by mouth daily.   lisinopril-hydrochlorothiazide 20-25 MG tablet Commonly known as:  PRINZIDE,ZESTORETIC Take 0.5 tablets by mouth daily.   topiramate 25 MG tablet Commonly known as:  TOPAMAX Take 25 mg by mouth 2 (two) times daily.       Allergies  Allergen Reactions  . Penicillins  ANAPHYLAXIS   . Contrast Media [Iodinated Diagnostic Agents]     hives  . Sulfur     HIVES      Procedures/Studies: Ct Angio Head W Or Wo Contrast  Result Date: 01/23/2018 CLINICAL DATA:  57 y/o F; left-sided weakness with onset earlier today. Further evaluation of stroke. EXAM: CT ANGIOGRAPHY HEAD AND NECK CT PERFUSION BRAIN TECHNIQUE: Multidetector CT imaging of the head and neck was performed using the standard protocol during bolus administration of intravenous contrast. Multiplanar CT image reconstructions and MIPs were obtained to evaluate the  vascular anatomy. Carotid stenosis measurements (when applicable) are obtained utilizing NASCET criteria, using the distal internal carotid diameter as the denominator. Multiphase CT imaging of the brain was performed following IV bolus contrast injection. Subsequent parametric perfusion maps were calculated using RAPID software. CONTRAST:  129mL ISOVUE-370 IOPAMIDOL (ISOVUE-370) INJECTION 76% COMPARISON:  01/23/2018 CT head. FINDINGS: CTA NECK FINDINGS Aortic arch: Standard branching. Imaged portion shows no evidence of aneurysm or dissection. No significant stenosis of the major arch vessel origins. Right carotid system: No evidence of dissection, stenosis (50% or greater) or occlusion. Left carotid system: No evidence of dissection, stenosis (50% or greater) or occlusion. Vertebral arteries: Right dominant. No evidence of dissection, stenosis (50% or greater) or occlusion. Skeleton: Moderate cervical spondylosis with predominantly discogenic degenerative changes from C3 through C7. There is loss of intervertebral disc space height and endplate marginal osteophytes. No high-grade bony canal stenosis. Uncovertebral and facet hypertrophy encroaches on the neural foramen bilaterally from C3 through C7. Other neck: 6 mm nodule in the right lobe of the thyroid gland. Dental disease with periapical cysts, absent crowns, and dental caries. Upper chest: Negative. Review of the MIP images confirms the above findings CTA HEAD FINDINGS Anterior circulation: No significant stenosis, proximal occlusion, aneurysm, or vascular malformation. Posterior circulation: No significant stenosis, proximal occlusion, aneurysm, or vascular malformation. Venous sinuses: As permitted by contrast timing, patent. Anatomic variants: Complete circle-of-Willis. Delayed phase: No abnormal intracranial enhancement. Review of the MIP images confirms the above findings CT Brain Perfusion Findings: CBF (<30%) Volume: 93mL Perfusion (Tmax>6.0s) volume:  43mL Mismatch Volume: 48mL Infarction Location:Not applicable. IMPRESSION: 1. Normal CT perfusion head. 2. Patent carotid and vertebral arteries. No dissection, aneurysm, or hemodynamically significant stenosis utilizing NASCET criteria. 3. Patent anterior and posterior intracranial circulation. No large vessel occlusion, aneurysm, or significant stenosis. These results were called by telephone at the time of interpretation on 01/23/2018 at 11:15 pm to Dr. Thurnell Garbe, who verbally acknowledged these results. Electronically Signed   By: Kristine Garbe M.D.   On: 01/23/2018 23:19   Dg Chest 2 View  Result Date: 01/24/2018 CLINICAL DATA:  57 y/o F; left-sided weakness with sudden onset chest pain and syncopal episode. EXAM: CHEST - 2 VIEW COMPARISON:  None. FINDINGS: The heart size and mediastinal contours are within normal limits. Both lungs are clear. The visualized skeletal structures are unremarkable. IMPRESSION: No acute pulmonary process identified. Electronically Signed   By: Kristine Garbe M.D.   On: 01/24/2018 00:17   Ct Angio Neck W And/or Wo Contrast  Result Date: 01/23/2018 CLINICAL DATA:  57 y/o F; left-sided weakness with onset earlier today. Further evaluation of stroke. EXAM: CT ANGIOGRAPHY HEAD AND NECK CT PERFUSION BRAIN TECHNIQUE: Multidetector CT imaging of the head and neck was performed using the standard protocol during bolus administration of intravenous contrast. Multiplanar CT image reconstructions and MIPs were obtained to evaluate the vascular anatomy. Carotid stenosis measurements (when applicable) are obtained utilizing NASCET  criteria, using the distal internal carotid diameter as the denominator. Multiphase CT imaging of the brain was performed following IV bolus contrast injection. Subsequent parametric perfusion maps were calculated using RAPID software. CONTRAST:  129mL ISOVUE-370 IOPAMIDOL (ISOVUE-370) INJECTION 76% COMPARISON:  01/23/2018 CT head. FINDINGS: CTA  NECK FINDINGS Aortic arch: Standard branching. Imaged portion shows no evidence of aneurysm or dissection. No significant stenosis of the major arch vessel origins. Right carotid system: No evidence of dissection, stenosis (50% or greater) or occlusion. Left carotid system: No evidence of dissection, stenosis (50% or greater) or occlusion. Vertebral arteries: Right dominant. No evidence of dissection, stenosis (50% or greater) or occlusion. Skeleton: Moderate cervical spondylosis with predominantly discogenic degenerative changes from C3 through C7. There is loss of intervertebral disc space height and endplate marginal osteophytes. No high-grade bony canal stenosis. Uncovertebral and facet hypertrophy encroaches on the neural foramen bilaterally from C3 through C7. Other neck: 6 mm nodule in the right lobe of the thyroid gland. Dental disease with periapical cysts, absent crowns, and dental caries. Upper chest: Negative. Review of the MIP images confirms the above findings CTA HEAD FINDINGS Anterior circulation: No significant stenosis, proximal occlusion, aneurysm, or vascular malformation. Posterior circulation: No significant stenosis, proximal occlusion, aneurysm, or vascular malformation. Venous sinuses: As permitted by contrast timing, patent. Anatomic variants: Complete circle-of-Willis. Delayed phase: No abnormal intracranial enhancement. Review of the MIP images confirms the above findings CT Brain Perfusion Findings: CBF (<30%) Volume: 31mL Perfusion (Tmax>6.0s) volume: 78mL Mismatch Volume: 31mL Infarction Location:Not applicable. IMPRESSION: 1. Normal CT perfusion head. 2. Patent carotid and vertebral arteries. No dissection, aneurysm, or hemodynamically significant stenosis utilizing NASCET criteria. 3. Patent anterior and posterior intracranial circulation. No large vessel occlusion, aneurysm, or significant stenosis. These results were called by telephone at the time of interpretation on 01/23/2018 at  11:15 pm to Dr. Thurnell Garbe, who verbally acknowledged these results. Electronically Signed   By: Kristine Garbe M.D.   On: 01/23/2018 23:19   Mr Brain Wo Contrast  Result Date: 01/24/2018 CLINICAL DATA:  LEFT-sided weakness. Follow up code stroke. History of hypertension. EXAM: MRI HEAD WITHOUT CONTRAST TECHNIQUE: Multiplanar, multiecho pulse sequences of the brain and surrounding structures were obtained without intravenous contrast. COMPARISON:  CT HEAD January 23, 2018. FINDINGS: INTRACRANIAL CONTENTS: No reduced diffusion to suggest acute ischemia. No susceptibility artifact to suggest hemorrhage. The ventricles and sulci are normal for patient's age. LEFT inferior basal ganglia perivascular space, normal variant. A few punctate supratentorial white matter FLAIR T2 hyperintensities, nonspecific and normal for age. No suspicious parenchymal signal, masses, mass effect. No abnormal extra-axial fluid collections. No extra-axial masses. VASCULAR: Normal major intracranial vascular flow voids present at skull base. SKULL AND UPPER CERVICAL SPINE: No abnormal sellar expansion. No suspicious calvarial bone marrow signal. Severe included cervical spondylosis. Craniocervical junction maintained. SINUSES/ORBITS: The mastoid air-cells and included paranasal sinuses are well-aerated.The included ocular globes and orbital contents are non-suspicious. OTHER: None. IMPRESSION: Negative noncontrast MRI head. Electronically Signed   By: Elon Alas M.D.   On: 01/24/2018 13:37   Ct Cerebral Perfusion W Contrast  Result Date: 01/23/2018 CLINICAL DATA:  57 y/o F; left-sided weakness with onset earlier today. Further evaluation of stroke. EXAM: CT ANGIOGRAPHY HEAD AND NECK CT PERFUSION BRAIN TECHNIQUE: Multidetector CT imaging of the head and neck was performed using the standard protocol during bolus administration of intravenous contrast. Multiplanar CT image reconstructions and MIPs were obtained to  evaluate the vascular anatomy. Carotid stenosis measurements (when applicable) are obtained utilizing  NASCET criteria, using the distal internal carotid diameter as the denominator. Multiphase CT imaging of the brain was performed following IV bolus contrast injection. Subsequent parametric perfusion maps were calculated using RAPID software. CONTRAST:  144mL ISOVUE-370 IOPAMIDOL (ISOVUE-370) INJECTION 76% COMPARISON:  01/23/2018 CT head. FINDINGS: CTA NECK FINDINGS Aortic arch: Standard branching. Imaged portion shows no evidence of aneurysm or dissection. No significant stenosis of the major arch vessel origins. Right carotid system: No evidence of dissection, stenosis (50% or greater) or occlusion. Left carotid system: No evidence of dissection, stenosis (50% or greater) or occlusion. Vertebral arteries: Right dominant. No evidence of dissection, stenosis (50% or greater) or occlusion. Skeleton: Moderate cervical spondylosis with predominantly discogenic degenerative changes from C3 through C7. There is loss of intervertebral disc space height and endplate marginal osteophytes. No high-grade bony canal stenosis. Uncovertebral and facet hypertrophy encroaches on the neural foramen bilaterally from C3 through C7. Other neck: 6 mm nodule in the right lobe of the thyroid gland. Dental disease with periapical cysts, absent crowns, and dental caries. Upper chest: Negative. Review of the MIP images confirms the above findings CTA HEAD FINDINGS Anterior circulation: No significant stenosis, proximal occlusion, aneurysm, or vascular malformation. Posterior circulation: No significant stenosis, proximal occlusion, aneurysm, or vascular malformation. Venous sinuses: As permitted by contrast timing, patent. Anatomic variants: Complete circle-of-Willis. Delayed phase: No abnormal intracranial enhancement. Review of the MIP images confirms the above findings CT Brain Perfusion Findings: CBF (<30%) Volume: 69mL Perfusion  (Tmax>6.0s) volume: 31mL Mismatch Volume: 49mL Infarction Location:Not applicable. IMPRESSION: 1. Normal CT perfusion head. 2. Patent carotid and vertebral arteries. No dissection, aneurysm, or hemodynamically significant stenosis utilizing NASCET criteria. 3. Patent anterior and posterior intracranial circulation. No large vessel occlusion, aneurysm, or significant stenosis. These results were called by telephone at the time of interpretation on 01/23/2018 at 11:15 pm to Dr. Thurnell Garbe, who verbally acknowledged these results. Electronically Signed   By: Kristine Garbe M.D.   On: 01/23/2018 23:19    Echocardiogram showed no valvular abnormalities and an EF of 60 to 65%   Subjective:   Discharge Exam: Vitals:   01/25/18 0306 01/25/18 0903  BP: (!) 93/59 98/67  Pulse: 71 72  Resp: 18 18  Temp: 98.6 F (37 C)   SpO2:  99%   Vitals:   01/25/18 0115 01/25/18 0306 01/25/18 0727 01/25/18 0903  BP: 103/69 (!) 93/59  98/67  Pulse: (!) 55 71  72  Resp: 17 18  18   Temp:  98.6 F (37 C)    TempSrc:  Oral Oral   SpO2: 98%   99%  Weight:      Height:        General: Pt is alert, awake, not in acute distress Cardiovascular: RRR, S1/S2 +, no rubs, no gallops Respiratory: CTA bilaterally, no wheezing, no rhonchi Abdominal: Soft, NT, ND, bowel sounds + Extremities: no edema, no cyanosis    The results of significant diagnostics from this hospitalization (including imaging, microbiology, ancillary and laboratory) are listed below for reference.     Microbiology: Recent Results (from the past 240 hour(s))  Culture, Urine     Status: Abnormal   Collection Time: 01/24/18  2:04 PM  Result Value Ref Range Status   Specimen Description URINE, CLEAN CATCH  Final   Special Requests NONE  Final   Culture (A)  Final    <10,000 COLONIES/mL INSIGNIFICANT GROWTH Performed at [facility], [address] [place], [place] [address]    Report Status 01/25/2018  FINAL  Final      Labs: Basic Metabolic Panel: Recent Labs  Lab 01/23/18 1648 01/24/18 0918 01/24/18 1420 01/25/18 0553  NA 139 141  --  142  K 3.1* 3.4*  --  4.5  CL 100 102  --  110  CO2 27 26  --  28  GLUCOSE 169* 109*  --  91  BUN 20 13  --  12  CREATININE 1.67* 0.99  --  0.96  CALCIUM 9.4 8.9  --  8.3*  MG  --   --  2.0  --    Liver Function Tests: Recent Labs  Lab 01/23/18 1648  AST 39  ALT 14  ALKPHOS 85  BILITOT 0.8  PROT 7.2  ALBUMIN 4.2   CBC: Recent Labs  Lab 01/23/18 1648 01/25/18 0553  WBC 5.9 5.2  NEUTROABS 3.2  --   HGB 14.2 11.8*  HCT 43.0 37.9  MCV 88.3 90.9  PLT 220 185   Cardiac Enzymes: Recent Labs  Lab 01/24/18 0918 01/24/18 1420 01/24/18 2046  TROPONINI <0.03 <0.03 <0.03   Hgb A1c Recent Labs    01/24/18 0918  HGBA1C 5.7*   Lipid Profile Recent Labs    01/24/18 0918  CHOL 115  HDL 51  LDLCALC 58  TRIG 32  CHOLHDL 2.3   Urinalysis    Component Value Date/Time   COLORURINE YELLOW 01/23/2018 1931   APPEARANCEUR HAZY (A) 01/23/2018 1931   LABSPEC 1.016 01/23/2018 1931   PHURINE 5.0 01/23/2018 1931   GLUCOSEU NEGATIVE 01/23/2018 1931   HGBUR SMALL (A) 01/23/2018 1931   BILIRUBINUR NEGATIVE 01/23/2018 1931   KETONESUR NEGATIVE 01/23/2018 1931   PROTEINUR NEGATIVE 01/23/2018 1931   NITRITE NEGATIVE 01/23/2018 1931   LEUKOCYTESUR LARGE (A) 01/23/2018 1931   Microbiology Recent Results (from the past 240 hour(s))  Culture, Urine     Status: Abnormal   Collection Time: 01/24/18  2:04 PM  Result Value Ref Range Status   Specimen Description URINE, CLEAN CATCH  Final   Special Requests NONE  Final   Culture (A)  Final    <10,000 COLONIES/mL INSIGNIFICANT GROWTH Performed at [facility], [place], [place], [place] [address]    Report Status 01/25/2018 FINAL  Final     Time coordinating discharge: 42 minutes  SIGNED:   Lady Deutscher, MD  FACP [facility] 01/25/2018, 10:49 AM Pager   If 7PM-7AM,  please contact night-coverage [email or web address] Password [email or web address]

## 2018-01-25 NOTE — Care Management Note (Addendum)
Case Management Note  Patient Details  Name: Annmargaret Decaprio MRN: [id number] Date of Birth: Jul 01, 1960  Subjective/Objective:      Pt in with TIA. She is from home alone. Pt states she has been going to [facility]: Dr Joellyn Rued and they are assisting her with medication cost. She uses the bus for transportation and her sister also provides transportation.  Pt seen by financial counseling for potential medicaid.       CM provided her SCAT application.  Pt with orders for [facility] services. CM notified Butch Penny with [facility] for charity [facility].          Action/Plan: Pt discharging home with self care. Pt not d/cing on any new medications. She states her sister will provide transport home.   Expected Discharge Date:  01/25/18               Expected Discharge Plan:  Home/Self Care  In-House Referral:     Discharge planning Services  CM Consult  Post Acute Care Choice:    Choice offered to:     DME Arranged:    DME Agency:     HH Arranged:    HH Agency:     Status of Service:  Completed, signed off  If discussed at [employer] of Stay Meetings, dates discussed:    Additional Comments:  Pollie Friar, RN 01/25/2018, 11:30 AM

## 2018-01-29 NOTE — Consult Note (Addendum)
    [facility] Consult Services Impression:  4 pm, left weakness and dizziness, the patient has chest pain, and lost consciousness.  She was walking at 5 pm, and she has had CT angio head/neck and perfusion.  She has been waxing and wayning with sympotms for the past week, hence no tpa, because the patient is outside of the TPA window.  CTA chest, and CTA head/neck are negative.   Comments: Last known well  @ 1703, paged @1709 , online @NIHSS  is 1715 @DT  is 1720   Recommendations:      tele monitoring Bedside swallow evaluation HOB less than 30 degrees IV Fluid hydration with NS Euglycemia avoid hyperthermia, PRN acetaminophen dvt ppx Consider inpatient neurology consultation Discussed with ED MD Please call with questions ----------------------------------------------------------------------------------------- CC History of Present Illness   The patient last konwn well was the night before, she has been waxint and wayning throuhought today. No TPA, because of this. She has been dizzy. MRI is negative, probably vertigo.        Diagnostic: CT head is negative.  Exam: Assessments NIHSS is 0 1A: Level of Consciousness - Alert; keenly responsive 0 1B: Ask Month and Age - Both Questions Right 0 1C: 'Blink Eyes' & 'Squeeze Hands' - Performs Both Tasks 0 2: Test Horizontal Extraocular Movements - Normal 0 3: Test Visual Fields - No Visual Loss 0 4: Test Facial Palsy - Normal symmetry 0 5A: Test Left Arm Motor Drift - No Drift for 10 Seconds 0 5B: Test Right Arm Motor Drift - No Drift for 10 Seconds 0 6A: Test Left Leg Motor Drift - No Drift for 5 Seconds 0 6B: Test Right Leg Motor Drift - No Drift for 5 Seconds 0 7: Test Limb Ataxia - No Ataxia 0 8: Test Sensation - Normal; No sensory loss 0 9: Test Language/Aphasia - Normal; No aphasia 0 10: Test Dysarthria - Normal 0 11: Test Extinction/Inattention - No abnormality 0   .        Medical  Decision Making: - Extensive number of diagnosis or management options are considered above. - Extensive amount of complex data reviewed. - High risk of complication and/or morbidity or mortality are associated with differential diagnostic considerations above. - There may be Uncertain outcome and increased probability of prolonged functional impairment or high probability of severe prolonged functional impairment associated with some of these differential diagnosis. Medical Data Reviewed: 1.Data reviewed include clinical labs, radiology,Medical Tests; 2.Tests results discussed w/performing or interpreting physician; 3.Obtaining/reviewing old medical records; 4.Obtaining case history from another source; 5.Independent review of image, tracing or specimen

## 2018-06-02 ENCOUNTER — Other Ambulatory Visit (HOSPITAL_COMMUNITY): Payer: Self-pay | Admitting: *Deleted

## 2018-06-02 DIAGNOSIS — N644 Mastodynia: Secondary | ICD-10-CM

## 2018-07-20 ENCOUNTER — Ambulatory Visit (HOSPITAL_COMMUNITY)
Admission: RE | Admit: 2018-07-20 | Discharge: 2018-07-20 | Disposition: A | Discharge status: Home / Self Care | Payer: Self-pay | Source: Ambulatory Visit | Attending: Obstetrics and Gynecology | Admitting: Obstetrics and Gynecology

## 2018-07-20 ENCOUNTER — Other Ambulatory Visit: Payer: Self-pay

## 2018-07-20 ENCOUNTER — Encounter (HOSPITAL_COMMUNITY): Payer: Self-pay

## 2018-07-20 ENCOUNTER — Ambulatory Visit: Payer: Self-pay

## 2018-07-20 ENCOUNTER — Ambulatory Visit
Admission: RE | Admit: 2018-07-20 | Discharge: 2018-07-20 | Disposition: A | Discharge status: Home / Self Care | Payer: No Typology Code available for payment source | Source: Ambulatory Visit | Attending: Obstetrics and Gynecology | Admitting: Obstetrics and Gynecology

## 2018-07-20 VITALS — BP 114/72 | Wt 165.0 lb

## 2018-07-20 DIAGNOSIS — N644 Mastodynia: Secondary | ICD-10-CM

## 2018-07-20 DIAGNOSIS — Z1239 Encounter for other screening for malignant neoplasm of breast: Secondary | ICD-10-CM

## 2018-07-20 HISTORY — DX: Cerebral infarction, unspecified: I63.9

## 2018-07-20 HISTORY — DX: Migraine, unspecified, not intractable, without status migrainosus: G43.909

## 2018-07-20 NOTE — Progress Notes (Signed)
Complaints of right shooting breast pain x one year that comes and goes. Patient rates the pain at a 4-6 out of 10.   Pap Smear: Pap smear not completed today. Last Pap smear was in February 2020 at at clinic on [facility] and normal per patient. Per patient has no history of an abnormal Pap smear. Patient has a history of a hysterectomy in 2010 for fibroids. Patient doesn't need any further Pap smears due to her history of a hysterectomy for benign reasons per BCCCP and ACOG guidelines. No Pap smear results are in Epic.  Physical exam: Breasts Breasts symmetrical. No skin abnormalities bilateral breasts. No nipple retraction bilateral breasts. No nipple discharge bilateral breasts. No lymphadenopathy. No lumps palpated bilateral breasts. Complaints of right outer breast tenderness on exam. Referred patient to the [facility] for a diagnostic mammogram. Appointment scheduled for Tuesday, July 20, 2018 at 1320.        Pelvic/Bimanual No Pap smear completed today since last Pap smear was in February 2020 and patient has a history of a hysterectomy for benign reasons. Pap smear not indicated per BCCCP guidelines.   Smoking History: Patient is a current smoker. Discussed smoking cessation with patient. Referred patient to the [facility] Quitline and gave resources to the free smoking cessation classes at [facility].  Patient Navigation: Patient education provided. Access to services provided for patient through [facility] program.   Colorectal Cancer Screening: Per patient has never had a colonoscopy completed. No complaints today. Patient has a FIT Test at home that she received from her PCP to complete.  Breast and Cervical Cancer Risk Assessment: Patient has a family history of her mother, sister, and a first cousin that has breast cancer. Patient has no known genetic mutations or history of radiation treatment to the chest before age 46. Patient has no history of cervical dysplasia,  immunocompromised, or DES exposure in-utero.  Risk Assessment    Risk Scores      07/20/2018   Last edited by: Armond Hang, LPN   5-year risk: 3.6 %   Lifetime risk: 17.6 %

## 2018-07-20 NOTE — Addendum Note (Signed)
Encounter addended by: Loletta Parish, RN on: 07/20/2018 1:21 PM  Actions taken: Clinical Note Signed

## 2018-07-20 NOTE — Patient Instructions (Addendum)
Explained breast self awareness with Wendy Leblanc. Patient did not need a Pap smear today due to last Pap smear was in February 2020 per patient and has a history of a hysterectomy for benign reasons. Let patient know that she doesn't need any further Pap smears due to her history of a hysterectomy for benign reasons. Referred patient to the [facility] for a diagnostic mammogram. Appointment scheduled for Tuesday, July 20, 2018 at 1320. Patient aware of appointment and will be there. Discussed smoking cessation with patient. Referred patient to the [facility] and gave resources to the free smoking cessation classes at [facility]. Wendy Leblanc verbalized understanding.  Mariesa Grieder, Arvil Chaco, RN 10:14 AM

## 2018-08-10 ENCOUNTER — Encounter (HOSPITAL_COMMUNITY): Payer: Self-pay | Admitting: *Deleted

## 2019-04-21 ENCOUNTER — Emergency Department (HOSPITAL_BASED_OUTPATIENT_CLINIC_OR_DEPARTMENT_OTHER)
Admission: EM | Admit: 2019-04-21 | Discharge: 2019-04-21 | Disposition: A | Discharge status: Home / Self Care | Payer: Medicare Other | Attending: Emergency Medicine | Admitting: Emergency Medicine

## 2019-04-21 ENCOUNTER — Encounter (HOSPITAL_BASED_OUTPATIENT_CLINIC_OR_DEPARTMENT_OTHER): Payer: Self-pay

## 2019-04-21 ENCOUNTER — Other Ambulatory Visit: Payer: Self-pay

## 2019-04-21 DIAGNOSIS — Z8673 Personal history of transient ischemic attack (TIA), and cerebral infarction without residual deficits: Secondary | ICD-10-CM | POA: Insufficient documentation

## 2019-04-21 DIAGNOSIS — I10 Essential (primary) hypertension: Secondary | ICD-10-CM | POA: Insufficient documentation

## 2019-04-21 DIAGNOSIS — Z91041 Radiographic dye allergy status: Secondary | ICD-10-CM | POA: Insufficient documentation

## 2019-04-21 DIAGNOSIS — F17210 Nicotine dependence, cigarettes, uncomplicated: Secondary | ICD-10-CM | POA: Insufficient documentation

## 2019-04-21 DIAGNOSIS — Z79899 Other long term (current) drug therapy: Secondary | ICD-10-CM | POA: Insufficient documentation

## 2019-04-21 DIAGNOSIS — K0889 Other specified disorders of teeth and supporting structures: Secondary | ICD-10-CM | POA: Insufficient documentation

## 2019-04-21 DIAGNOSIS — Z880 Allergy status to penicillin: Secondary | ICD-10-CM | POA: Insufficient documentation

## 2019-04-21 MED ORDER — CLINDAMYCIN HCL 150 MG PO CAPS: 300.0000 mg | ORAL_CAPSULE | Freq: Three times a day (TID) | ORAL | 0 refills | Status: AC

## 2019-04-21 MED ORDER — OXYCODONE-ACETAMINOPHEN 5-325 MG PO TABS
1.0000 | ORAL_TABLET | Freq: Once | ORAL | Status: AC
  Administered 2019-04-21: 1 via ORAL
  Filled 2019-04-21: qty 1

## 2019-04-21 MED ORDER — NAPROXEN 500 MG PO TABS: 500.0000 mg | ORAL_TABLET | Freq: Two times a day (BID) | ORAL | 0 refills | Status: DC

## 2019-04-21 NOTE — ED Triage Notes (Signed)
Pt c/o left lower toothache x 1 year-worse x 5 days-NAD-steady gait with own cane

## 2019-04-21 NOTE — ED Provider Notes (Signed)
[facility] EMERGENCY DEPARTMENT Provider Note   CSN: [id number] Arrival date & time: 04/21/19  1511     History Chief Complaint  Patient presents with  . Dental Pain    Wendy Leblanc is a 58 y.o. female with a history of HTN, migraines, & prior strokes who presents to the ED with complaints of dental pain for the past 5 days. Patient states pain is located to the molars of the left lower gumline, it is constant, no alleviating/aggravating factors, tried tylenol w/o much relief. Does not currently see a dentist. Denies fever, chills, neck stiffness, pain/swelling beneath the tongue, or vomiting. She states sometimes she breathes faster when the tooth really hurts but denies dyspnea, chest pain, palpitations, or cough.   HPI     Past Medical History:  Diagnosis Date  . Arthritis   . Hypertension   . Migraine   . Stroke [facility])     Patient Active Problem List   Diagnosis Date Noted  . TIA (transient ischemic attack) 01/24/2018  . Essential hypertension 01/24/2018  . Depression 01/24/2018    Past Surgical History:  Procedure Laterality Date  . ABDOMINAL HYSTERECTOMY    . CARPAL TUNNEL RELEASE       OB History    Gravida  3   Para      Term      Preterm      AB  1   Living  2     SAB  1   TAB      Ectopic      Multiple      Live Births  2           Family History  Problem Relation Age of Onset  . Breast cancer Mother   . Breast cancer Sister   . Diabetes Brother   . Diabetes Sister     Social History   Tobacco Use  . Smoking status: Current Every Day Smoker    Packs/day: 0.50    Types: Cigarettes  . Smokeless tobacco: Never Used  Substance Use Topics  . Alcohol use: Not Currently  . Drug use: Never    Home Medications Prior to Admission medications   Medication Sig Start Date End Date Taking? Authorizing Provider  amLODipine (NORVASC) 10 MG tablet Take 10 mg by mouth daily. 12/29/17   [provider]    aspirin EC 81 MG tablet Take 81 mg by mouth daily.    [provider]  atorvastatin (LIPITOR) 20 MG tablet Take 20 mg by mouth daily.    [provider]  escitalopram (LEXAPRO) 10 MG tablet Take 10 mg by mouth daily.    [provider]  lisinopril-hydrochlorothiazide (PRINZIDE,ZESTORETIC) 20-25 MG tablet Take 0.5 tablets by mouth daily.  12/29/17   [provider]  topiramate (TOPAMAX) 25 MG tablet Take 25 mg by mouth 2 (two) times daily. 12/29/17   [provider]    Allergies    Penicillins, Contrast media [iodinated diagnostic agents], and Sulfur  Review of Systems   Review of Systems  Constitutional: Negative for chills and fever.  HENT: Positive for dental problem. Negative for congestion, drooling, ear pain, sore throat, trouble swallowing and voice change.   Respiratory: Negative for shortness of breath.   Cardiovascular: Negative for chest pain and palpitations.  Gastrointestinal: Negative for abdominal pain, nausea and vomiting.    Physical Exam Updated Vital Signs BP (!) 135/94 (BP Location: Right Arm)   Pulse (!) 103  Temp 98.5 F (36.9 C) (Oral)   Resp (!) 24   Ht 5\' 3"  (1.6 m)   Wt 89.8 kg   SpO2 100%   BMI 35.07 kg/m   Physical Exam Vitals and nursing note reviewed.  Constitutional:      General: She is not in acute distress.    Appearance: She is well-developed. She is not toxic-appearing.  HENT:     Head: Normocephalic and atraumatic.     Right Ear: Tympanic membrane is not perforated, erythematous, retracted or bulging.     Left Ear: Tympanic membrane is not perforated, erythematous, retracted or bulging.     Nose: Nose normal.     Mouth/Throat:     Pharynx: Uvula midline. No oropharyngeal exudate, posterior oropharyngeal erythema or uvula swelling.     Tonsils: No tonsillar abscesses.     Comments: Poor dentition, several areas of tooth decay. Tooth #s 21 & 20 are decayed to the gumline. Tooth 19 is broken.  Tooth 19 is tender to palpation as is the surrounding gingiva which is mildly swollen/erythematous. No palpable fluctuance or gross abscess. Posterior oropharynx is symmetric appearing. Patient tolerating own secretions without difficulty. No trismus. No drooling. No hot potato voice. No swelling beneath the tongue, submandibular compartment is soft.   Eyes:     General:        Right eye: No discharge.        Left eye: No discharge.     Conjunctiva/sclera: Conjunctivae normal.  Cardiovascular:     Rate and Rhythm: Normal rate and regular rhythm.  Pulmonary:     Effort: Pulmonary effort is normal. No respiratory distress.     Breath sounds: Normal breath sounds.  Musculoskeletal:     Cervical back: Normal range of motion and neck supple.  Lymphadenopathy:     Cervical: No cervical adenopathy.  Neurological:     Mental Status: She is alert.     Comments: Ambulatory with cane which she uses at baseline.   Psychiatric:        Behavior: Behavior normal.        Thought Content: Thought content normal.    ED Results / Procedures / Treatments   Labs (all labs ordered are listed, but only abnormal results are displayed) Labs Reviewed - No data to display  EKG None  Radiology No results found.  Procedures Procedures (including critical care time)  Medications Ordered in ED Medications - No data to display  ED Course/MDM:   I have reviewed the triage vital signs and the nursing notes.  Pertinent labs & imaging results that were available during my care of the patient were reviewed by me and considered in my medical decision making (see chart for details).    Patient presents with dental pain. Patient is nontoxic appearing, vitals without significant abnormality- initial tachycardia/tachpnea resolved, she states she breathes faster when her tooth really hurts but this improved with soothing/calming. No gross abscess.  Exam unconcerning for Ludwig's angina or deep space infection.  Will treat with Clindamycin and Naproxen.  Urged patient to follow-up with dentist, dental resources were provided.  Discussed treatment plan and need for follow up as well as return precautions. Provided opportunity for questions, patient confirmed understanding and is agreeable to plan.  Blood pressure (!) 131/92, pulse 97, temperature 98.5 F (36.9 C), temperature source Oral, resp. rate 20, height 5\' 3"  (1.6 m), weight 89.8 kg, SpO2 100 %.  Final Clinical Impression(s) / ED Diagnoses Final diagnoses:  Pain, dental  Rx / DC Orders ED Discharge Orders         Ordered    naproxen (NAPROSYN) 500 MG tablet  2 times daily     04/21/19 1541    clindamycin (CLEOCIN) 150 MG capsule  3 times daily     04/21/19 [address], Glynda Jaeger, PA-C 04/21/19 1632    Dorie Rank, MD 04/25/19 [phone]

## 2019-04-21 NOTE — ED Notes (Signed)
Pts ride arrived, pt walked to d/c. Pt verbalized understanding d/c instructions.

## 2019-04-21 NOTE — Discharge Instructions (Signed)
Call one of the dentists offices provided to schedule an appointment for re-evaluation and further management within the next 48 hours.   I have prescribed you Clindamycin which is an antibiotic to treat the infection and Naproxen which is an anti-inflammatory medicine to treat the pain.   Please take all of your antibiotics until finished. You may develop abdominal discomfort or diarrhea from the antibiotic.  You may help offset this with probiotics which you can buy at the store (ask your pharmacist if unable to find) or get probiotics in the form of eating yogurt. Do not eat or take the probiotics until 2 hours after your antibiotic. If you are unable to tolerate these side effects follow-up with your primary care provider or return to the emergency department.   If you begin to experience any blistering, rashes, swelling, or difficulty breathing seek medical care for evaluation of potentially more serious side effects.   Be sure to eat something when taking the Naproxen as it can cause stomach upset and at worst stomach bleeding. Do not take additional non steroidal anti-inflammatory medicines such as Ibuprofen, Aleve, Advil, Mobic, Diclofenac, or goodie powder while taking Naproxen. You may supplement with Tylenol.   We have prescribed you new medication(s) today. Discuss the medications prescribed today with your pharmacist as they can have adverse effects and interactions with your other medicines including over the counter and prescribed medications. Seek medical evaluation if you start to experience new or abnormal symptoms after taking one of these medicines, seek care immediately if you start to experience difficulty breathing, feeling of your throat closing, facial swelling, or rash as these could be indications of a more serious allergic reaction  If you start to experience and new or worsening symptoms return to the emergency department. If you start to experience fever, chills, neck  stiffness/pain, or inability to move your neck or open your mouth come back to the emergency department immediately.

## 2019-08-04 ENCOUNTER — Other Ambulatory Visit: Payer: Self-pay | Admitting: Family Medicine

## 2019-08-04 DIAGNOSIS — Z1231 Encounter for screening mammogram for malignant neoplasm of breast: Secondary | ICD-10-CM

## 2019-08-08 ENCOUNTER — Ambulatory Visit
Admission: RE | Admit: 2019-08-08 | Discharge: 2019-08-08 | Disposition: A | Discharge status: Home / Self Care | Payer: Medicare HMO | Source: Ambulatory Visit | Attending: Family Medicine | Admitting: Family Medicine

## 2019-08-08 ENCOUNTER — Other Ambulatory Visit: Payer: Self-pay | Admitting: Family Medicine

## 2019-08-08 ENCOUNTER — Other Ambulatory Visit: Payer: Self-pay

## 2019-08-08 DIAGNOSIS — Z1231 Encounter for screening mammogram for malignant neoplasm of breast: Secondary | ICD-10-CM

## 2019-08-08 DIAGNOSIS — N6452 Nipple discharge: Secondary | ICD-10-CM

## 2019-08-23 ENCOUNTER — Encounter: Payer: Self-pay | Admitting: Neurology

## 2019-08-24 ENCOUNTER — Encounter: Payer: Self-pay | Admitting: Gastroenterology

## 2019-08-26 ENCOUNTER — Other Ambulatory Visit: Payer: Self-pay | Admitting: *Deleted

## 2019-09-09 ENCOUNTER — Ambulatory Visit
Admission: RE | Admit: 2019-09-09 | Discharge: 2019-09-09 | Disposition: A | Discharge status: Home / Self Care | Payer: Medicare HMO | Source: Ambulatory Visit | Attending: Family Medicine | Admitting: Family Medicine

## 2019-09-09 ENCOUNTER — Other Ambulatory Visit: Payer: Self-pay | Admitting: Family Medicine

## 2019-09-09 ENCOUNTER — Other Ambulatory Visit: Payer: Self-pay

## 2019-09-09 DIAGNOSIS — R0609 Other forms of dyspnea: Secondary | ICD-10-CM

## 2019-09-13 ENCOUNTER — Other Ambulatory Visit: Payer: Self-pay

## 2019-09-13 ENCOUNTER — Ambulatory Visit
Admission: RE | Admit: 2019-09-13 | Discharge: 2019-09-13 | Disposition: A | Discharge status: Home / Self Care | Payer: Medicare HMO | Source: Ambulatory Visit | Attending: Family Medicine | Admitting: Family Medicine

## 2019-09-13 DIAGNOSIS — N6452 Nipple discharge: Secondary | ICD-10-CM

## 2019-09-21 ENCOUNTER — Other Ambulatory Visit: Payer: Self-pay

## 2019-09-21 ENCOUNTER — Ambulatory Visit (AMBULATORY_SURGERY_CENTER): Payer: Self-pay | Admitting: *Deleted

## 2019-09-21 VITALS — Temp 97.8°F | Ht 63.0 in | Wt 202.0 lb

## 2019-09-21 DIAGNOSIS — Z1211 Encounter for screening for malignant neoplasm of colon: Secondary | ICD-10-CM

## 2019-09-21 MED ORDER — NA SULFATE-K SULFATE-MG SULF 17.5-3.13-1.6 GM/177ML PO SOLN: ORAL | 0 refills | Status: DC

## 2019-09-21 NOTE — Progress Notes (Signed)
Patient is here in-person for PV. NO one with patient today and she states she is able to sign herself. Patient denies any allergies to eggs or soy. Patient denies any problems with anesthesia/sedation. Patient denies any oxygen use at home. Patient denies taking any diet/weight loss medications or blood thinners. Patient is not being treated for MRSA or C-diff. Patient is aware of our care-partner policy and 0000000 safety protocol. EMMI education assisgned to the patient for the procedure, this was explained and instructions given to patient.  Completed vaccines on 08/30/2019 per pt. 2 day prep Suprep/Miralx due to pt c/o constipation.

## 2019-09-28 ENCOUNTER — Encounter: Payer: Self-pay | Admitting: *Deleted

## 2019-09-30 ENCOUNTER — Encounter: Payer: Self-pay | Admitting: Neurology

## 2019-09-30 ENCOUNTER — Other Ambulatory Visit: Payer: Self-pay

## 2019-09-30 ENCOUNTER — Ambulatory Visit (INDEPENDENT_AMBULATORY_CARE_PROVIDER_SITE_OTHER): Payer: Medicare HMO | Admitting: Neurology

## 2019-09-30 VITALS — BP 110/73 | HR 88 | Ht 63.0 in | Wt 202.5 lb

## 2019-09-30 DIAGNOSIS — G4719 Other hypersomnia: Secondary | ICD-10-CM

## 2019-09-30 DIAGNOSIS — R29898 Other symptoms and signs involving the musculoskeletal system: Secondary | ICD-10-CM | POA: Diagnosis not present

## 2019-09-30 DIAGNOSIS — R269 Unspecified abnormalities of gait and mobility: Secondary | ICD-10-CM

## 2019-09-30 DIAGNOSIS — G5603 Carpal tunnel syndrome, bilateral upper limbs: Secondary | ICD-10-CM

## 2019-09-30 DIAGNOSIS — M4802 Spinal stenosis, cervical region: Secondary | ICD-10-CM

## 2019-09-30 DIAGNOSIS — R0683 Snoring: Secondary | ICD-10-CM

## 2019-09-30 DIAGNOSIS — R208 Other disturbances of skin sensation: Secondary | ICD-10-CM

## 2019-09-30 DIAGNOSIS — G4733 Obstructive sleep apnea (adult) (pediatric): Secondary | ICD-10-CM

## 2019-09-30 MED ORDER — IMIPRAMINE HCL 25 MG PO TABS: 25.0000 mg | ORAL_TABLET | Freq: Every day | ORAL | 5 refills | Status: DC

## 2019-09-30 NOTE — Progress Notes (Signed)
[facility]  PATIENT: Wendy Leblanc DOB: 1961/01/22  REFERRING DOCTOR OR PCP:  Vassie Moment, MD SOURCE: patient, notes from PCP, lab reports, MRI of the brain.  _________________________________   HISTORICAL  CHIEF COMPLAINT:  Chief Complaint  Patient presents with  . New Patient (Initial Visit)    Rm 13, carpal tunnel and gait instability, neck (pinched nerve).  . ref by Lavonia Drafts    Carpal tunnel bilateral started 1990' Had surgery on R, Gait issues started after stroke 01-2019, worsening using cane,  Falls 4, most recent 09/2019.  Has cataracts due L surrgery 10-07-19    HISTORY OF PRESENT ILLNESS:  I had the pleasure of seeing your patient, Wendy Leblanc, at [facility] for neurologic consultation regarding her carpal tunnel syndrome and gait disturbance.  She is a 59 year old woman who has had multiple falls due to her legs giving out after she walks a while. She has been using a cane since a possible TIA in September 2019.  She feels she is slowly worsening.   She has neck pain, worse with bending.   She notes pain radiates into shoulders and down her arms into all fingers but more on the thumb sie than 5th finger side.    She feels weak in both her arms and hands.   The right is weaker than the left now but in the past, the left was worse.    She has urinary frequency.   She has insomnia and wakes up every few hours (sometimes for pain and sometimes for bladder).    She had CTS and had a carpal tunnel release on the right.    Since she had no benefit she did not proceed with left CTR.  Vascular risks: Hypertension, borderline DM, hyperlipidemia, smoking (1/3 ppd now but up to 1 ppd in the past, starting age 89).   Possible OSA  In September 2019 she had chest pain and syncope.   When she regained consciousness, she had left side weakness which quickly improved to baseline.   No stroke was found on CT or MRI and no significant arterial  stenosis.     She also has snoring, EDS and witnessed OSA.   EPWORTH SLEEPINESS SCALE  On a scale of 0 - 3 what is the chance of dozing:  Sitting and Reading:  3 Watching TV:   3 Sitting inactive in a public place: 2 Passenger in car for one hour: 3 Lying down to rest in the afternoon: 3 Sitting and talking to someone: 2 Sitting quietly after lunch:  3 In a car, stopped in traffic:  0 (does not snore)  Total (out of 24): 19/24  Severe sleepiness   I personally reviewed the MRI of the brain dated 01/24/2018.  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   She also had a CT angiogram of the neck 01/23/2018.  I personally reviewed the images.  There were no significant arterial issues.  She did have significant degenerative changes from C3-C7 with reduced disc height as well as uncovertebral spurring, mild facet hypertrophy and possibility of spinal stenosis at these levels.  There were no acute findings.  I also reviewed recent lab work from 07/25/2019.  CBC CMP, lipid panel were unremarkable.  TSH was normal but hemoglobin A1c was minimally elevated at 5.8  REVIEW OF SYSTEMS: Constitutional: No fevers, chills, sweats, or change in appetite Eyes: No visual changes, double vision, eye  pain Ear, nose and throat: No hearing loss, ear pain, nasal congestion, sore throat Cardiovascular: No chest pain, palpitations Respiratory: No shortness of breath at rest or with exertion.   No wheezes GastrointestinaI: No nausea, vomiting, diarrhea, abdominal pain, fecal incontinence Genitourinary: No dysuria, urinary retention or frequency.  No nocturia. Musculoskeletal: No neck pain, back pain Integumentary: No rash, pruritus, skin lesions Neurological: as above Psychiatric: No depression at this time.  No anxiety Endocrine: No palpitations, diaphoresis, change in appetite, change in weigh or increased thirst Hematologic/Lymphatic: No anemia, purpura,  petechiae. Allergic/Immunologic: No itchy/runny eyes, nasal congestion, recent allergic reactions, rashes  ALLERGIES: Allergies  Allergen Reactions  . Penicillins     ANAPHYLAXIS   . Contrast Media [Iodinated Diagnostic Agents]     hives  . Sulfur     HIVES     HOME MEDICATIONS:  Current Outpatient Medications:  .  acetaminophen (TYLENOL) 500 MG tablet, Take 500 mg by mouth every 6 (six) hours as needed., Disp: , Rfl:  .  amLODipine (NORVASC) 10 MG tablet, Take 10 mg by mouth daily., Disp: , Rfl: 0 .  aspirin EC 81 MG tablet, Take 81 mg by mouth daily., Disp: , Rfl:  .  atorvastatin (LIPITOR) 20 MG tablet, Take 20 mg by mouth daily., Disp: , Rfl:  .  Cholecalciferol (VITAMIN D3 PO), Take by mouth. 1000u daily, Disp: , Rfl:  .  cyclobenzaprine (FLEXERIL) 5 MG tablet, Take 5 mg by mouth 3 (three) times daily as needed., Disp: , Rfl:  .  escitalopram (LEXAPRO) 10 MG tablet, Take 10 mg by mouth daily., Disp: , Rfl:  .  lisinopril-hydrochlorothiazide (PRINZIDE,ZESTORETIC) 20-25 MG tablet, Take 0.5 tablets by mouth daily. , Disp: , Rfl: 0 .  Na Sulfate-K Sulfate-Mg Sulf 17.5-3.13-1.6 GM/177ML SOLN, Suprep (no substitutions)-TAKE AS DIRECTED., Disp: 354 mL, Rfl: 0 .  topiramate (TOPAMAX) 25 MG tablet, Take 25 mg by mouth 2 (two) times daily., Disp: , Rfl: 0  PAST MEDICAL HISTORY: Past Medical History:  Diagnosis Date  . Anxiety   . Arthritis   . Carpal tunnel syndrome   . Cataract   . CVA (cerebral vascular accident) ([place])   . Depression   . Gait abnormality   . Hyperlipidemia   . Hypertension   . Migraine   . Osteoarthritis   . Smoker   . Stroke [facility]) 2019    PAST SURGICAL HISTORY: Past Surgical History:  Procedure Laterality Date  . ABDOMINAL HYSTERECTOMY    . CARPAL TUNNEL RELEASE    . hemorrhoid surgery    . TUBAL LIGATION      FAMILY HISTORY: Family History  Problem Relation Age of Onset  . Breast cancer Mother   . Heart disease Mother   . Breast cancer  Sister   . Diabetes Brother   . Diabetes Sister   . Colon cancer Neg Hx   . Esophageal cancer Neg Hx   . Rectal cancer Neg Hx   . Stomach cancer Neg Hx   . Colon polyps Neg Hx     SOCIAL HISTORY:  Social History   Socioeconomic History  . Marital status: Widowed    Spouse name: Not on file  . Number of children: 2  . Years of education: Not on file  . Highest education level: 12th grade  Occupational History  . Not on file  Tobacco Use  . Smoking status: Current Every Day Smoker    Packs/day: 0.50    Types: Cigarettes  . Smokeless tobacco: Never  Used  Substance and Sexual Activity  . Alcohol use: Not Currently  . Drug use: Never  . Sexual activity: Not on file  Other Topics Concern  . Not on file  Social History Narrative  . Not on file   Social Determinants of Health   Financial Resource Strain:   . Difficulty of Paying Living Expenses:   Food Insecurity:   . Worried About Charity fundraiser in the Last Year:   . Arboriculturist in the Last Year:   Transportation Needs:   . Film/video editor (Medical):   Marland Kitchen Lack of Transportation (Non-Medical):   Physical Activity:   . Days of Exercise per Week:   . Minutes of Exercise per Session:   Stress:   . Feeling of Stress :   Social Connections:   . Frequency of Communication with Friends and Family:   . Frequency of Social Gatherings with Friends and Family:   . Attends Religious Services:   . Active Member of Clubs or Organizations:   . Attends Archivist Meetings:   Marland Kitchen Marital Status:   Intimate Partner Violence:   . Fear of Current or Ex-Partner:   . Emotionally Abused:   Marland Kitchen Physically Abused:   . Sexually Abused:      PHYSICAL EXAM  Vitals:   09/30/19 0948  BP: 110/73  Pulse: 88  Weight: 202 lb 8 oz (91.9 kg)  Height: 5\' 3"  (1.6 m)    Body mass index is 35.87 kg/m.   General: The patient is well-developed and well-nourished and in no acute distress  HEENT:  Head is [place]/AT.   Sclera are anicteric.  Neck: No carotid bruits are noted.  The neck is nontender.  Cardiovascular: The heart has a regular rate and rhythm with a normal S1 and S2. There were no murmurs, gallops or rubs.    Skin: Extremities are without rash or  edema.  Musculoskeletal:  Back is nontender  Neurologic Exam  Mental status: The patient is alert and oriented x 3 at the time of the examination. The patient has apparent normal recent and remote memory, with an apparently normal attention span and concentration ability.   Speech is normal.  Cranial nerves: Extraocular movements are full. Pupils are equal, round, and reactive to light and accomodation.  Visual fields are full.  Facial symmetry is present. There is good facial sensation to soft touch bilaterally.Facial strength is normal.  Trapezius and sternocleidomastoid strength is normal. No dysarthria is noted.  The tongue is midline, and the patient has symmetric elevation of the soft palate. No obvious hearing deficits are noted.  Motor:  Muscle bulk is normal.   Tone is normal. Strength is  5 / 5 in most muscles but 4/5 in biceps and 4+ in deltoids and 4/5 left APB and 4+/5 right APB.   Able to stand on toes, heels and squat/rise.    Sensory: Sensory testing is intact to pinprick, soft touch and vibration sensation in all 4 extremities proximally mild reduced sensation in thumb vs 5th finger bilaterally.    Mild reduced vibration and pp at toes  Coordination: Cerebellar testing reveals good finger-nose-finger and heel-to-shin bilaterally.  Gait and station: Station is normal.   Gait is wide with reduced stride. Tandem gait is very poor. Romberg is negative.   Reflexes: Deep tendon reflexes are symmetric and 2 at brachioradilis, trace at biceps and 1 at triceps.   They are 1 at knees and 2 at ankles.  No clonus.   Plantar responses are flexor.    DIAGNOSTIC DATA (LABS, IMAGING, TESTING) - I reviewed patient records, labs, notes, testing  and imaging myself where available.  Lab Results  Component Value Date   WBC 5.2 01/25/2018   HGB 11.8 (L) 01/25/2018   HCT 37.9 01/25/2018   MCV 90.9 01/25/2018   PLT 185 01/25/2018      Component Value Date/Time   NA 142 01/25/2018 0553   K 4.5 01/25/2018 0553   CL 110 01/25/2018 0553   CO2 28 01/25/2018 0553   GLUCOSE 91 01/25/2018 0553   BUN 12 01/25/2018 0553   CREATININE 0.96 01/25/2018 0553   CALCIUM 8.3 (L) 01/25/2018 0553   PROT 7.2 01/23/2018 1648   ALBUMIN 4.2 01/23/2018 1648   AST 39 01/23/2018 1648   ALT 14 01/23/2018 1648   ALKPHOS 85 01/23/2018 1648   BILITOT 0.8 01/23/2018 1648   GFRNONAA >60 01/25/2018 0553   GFRAA >60 01/25/2018 0553   Lab Results  Component Value Date   CHOL 115 01/24/2018   HDL 51 01/24/2018   LDLCALC 58 01/24/2018   TRIG 32 01/24/2018   CHOLHDL 2.3 01/24/2018   Lab Results  Component Value Date   HGBA1C 5.7 (H) 01/24/2018        ASSESSMENT AND PLAN  Cervical stenosis of spinal canal - Plan: NCV with EMG(electromyography), MR CERVICAL SPINE WO CONTRAST  Gait disturbance - Plan: MR CERVICAL SPINE WO CONTRAST  Arm weakness - Plan: NCV with EMG(electromyography), MR CERVICAL SPINE WO CONTRAST  Carpal tunnel syndrome, bilateral - Plan: NCV with EMG(electromyography)  Dysesthesia  Snoring - Plan: Split night study  Excessive daytime sleepiness - Plan: Split night study  OSA (obstructive sleep apnea) - Plan: Split night study  In summary, Ms. Delany is a 59 year old woman with gait disturbance, arm weakness and numbness who also appears to have obstructive sleep apnea.  I have reviewed the CT angiogram and note that there is significant spinal degenerative changes from C3-C7 with probable spinal stenosis.  It is possible that this has progressed and is affecting her gait.  Therefore, we need to check an MRI of the cervical spine to assess for spinal stenosis, radiculopathy and possible compressive myelopathy.  Additionally,  she has some numbness in her toes as well as numbness in the hands.  The numbness in the hands could be due to either a mononeuropathy such as carpal tunnel syndrome or cervical radiculopathy.  The numbness in the feet is most likely related to her mild diabetes.  We will check a nerve conduction study to further evaluate these possibilities.  Based on the results she may need referral for more definitive treatment.  In the interim, I will have her take imipramine at bedtime.  This will hopefully help her sleep better at night, have less pain and less nocturia.  Because of the probable sleep apnea (witnessed by family) and her severe excessive daytime sleepiness, we will also check a split-night polysomnogram and titrate on CPAP depending on the results  She will return to see me for the EMG but call sooner if she has new or worsening neurologic symptoms.  We will let her know the results of the other tests after they are performed.  Thank you for asking me to see Ms. Krishna.  Please let me know if I can be of further assistance with her or other patients in the future.     Rosaisela Jamroz A. Felecia Shelling, MD, [facility] [id number], [id number] AM Certified  in Neurology, Clinical Neurophysiology, Sleep Medicine and Neuroimaging  [facility] [address] [place], [place] [address] [phone]

## 2019-10-05 ENCOUNTER — Other Ambulatory Visit: Payer: Self-pay

## 2019-10-05 ENCOUNTER — Ambulatory Visit (AMBULATORY_SURGERY_CENTER): Payer: Medicare HMO | Admitting: Gastroenterology

## 2019-10-05 ENCOUNTER — Encounter: Payer: Self-pay | Admitting: Gastroenterology

## 2019-10-05 VITALS — BP 110/75 | HR 104 | Temp 97.3°F | Resp 17 | Ht 63.0 in | Wt 202.0 lb

## 2019-10-05 DIAGNOSIS — D124 Benign neoplasm of descending colon: Secondary | ICD-10-CM

## 2019-10-05 DIAGNOSIS — D122 Benign neoplasm of ascending colon: Secondary | ICD-10-CM

## 2019-10-05 DIAGNOSIS — Z1211 Encounter for screening for malignant neoplasm of colon: Secondary | ICD-10-CM

## 2019-10-05 DIAGNOSIS — D125 Benign neoplasm of sigmoid colon: Secondary | ICD-10-CM | POA: Diagnosis not present

## 2019-10-05 MED ORDER — SODIUM CHLORIDE 0.9 % IV SOLN: 500.0000 mL | Freq: Once | INTRAVENOUS | Status: DC

## 2019-10-05 NOTE — Progress Notes (Signed)
Pt's states no medical or surgical changes since previsit or office visit. 

## 2019-10-05 NOTE — Op Note (Signed)
[address] Patient Name: Wendy Leblanc Procedure Date: 10/05/2019 9:58 AM MRN: [id number] Endoscopist: Mallie Mussel L. Loletha Carrow , MD Age: 59 Referring MD:  Date of Birth: 22-Sep-1960 Gender: Female Account #: [id number] Procedure:                Colonoscopy Indications:              Screening for colorectal malignant neoplasm, This                            is the patient's first colonoscopy Medicines:                Monitored Anesthesia Care Procedure:                Pre-Anesthesia Assessment:                           - Prior to the procedure, a History and Physical                            was performed, and patient medications and                            allergies were reviewed. The patient's tolerance of                            previous anesthesia was also reviewed. The risks                            and benefits of the procedure and the sedation                            options and risks were discussed with the patient.                            All questions were answered, and informed consent                            was obtained. Prior Anticoagulants: The patient has                            taken no previous anticoagulant or antiplatelet                            agents except for aspirin. ASA Grade Assessment:                            III - A patient with severe systemic disease. After                            reviewing the risks and benefits, the patient was                            deemed in satisfactory condition to undergo the  procedure.                           After obtaining informed consent, the colonoscope                            was passed under direct vision. Throughout the                            procedure, the patient's blood pressure, pulse, and                            oxygen saturations were monitored continuously. The                            Colonoscope was introduced through the anus and                       advanced to the the cecum, identified by                            appendiceal orifice and ileocecal valve. The                            colonoscopy was performed without difficulty. The                            patient tolerated the procedure well. The quality                            of the bowel preparation was excellent. The                            ileocecal valve, appendiceal orifice, and rectum                            were photographed. The bowel preparation used was 2                            day Suprep/Miralax. Scope In: [id number] AM Scope Out: 10:20:56 AM Scope Withdrawal Time: 0 hours 12 minutes 30 seconds  Total Procedure Duration: 0 hours 14 minutes 47 seconds  Findings:                 The perianal and digital rectal examinations were                            normal.                           Three semi-sessile polyps were found in the                            descending colon and ascending colon. The polyps  were diminutive in size. These polyps were removed                            with a cold biopsy forceps. Resection and retrieval                            were complete.                           An 8-10 mm polyp was found in the recto-sigmoid                            colon. The polyp was pedunculated. The polyp was                            removed with a hot snare. Resection and retrieval                            were complete.                           Internal hemorrhoids were found.                           The exam was otherwise without abnormality on                            direct and retroflexion views. Complications:            No immediate complications. Estimated Blood Loss:     Estimated blood loss was minimal. Impression:               - Three diminutive polyps in the descending colon                            and in the ascending colon, removed with a cold                             biopsy forceps. Resected and retrieved.                           - One 8-10 mm polyp at the recto-sigmoid colon,                            removed with a hot snare. Resected and retrieved.                           - Internal hemorrhoids.                           - The examination was otherwise normal on direct                            and retroflexion views. Recommendation:           - Patient has a contact number available  for                            emergencies. The signs and symptoms of potential                            delayed complications were discussed with the                            patient. Return to normal activities tomorrow.                            Written discharge instructions were provided to the                            patient.                           - Resume previous diet.                           - Continue present medications.                           - Await pathology results.                           - Repeat colonoscopy is recommended for                            surveillance. The colonoscopy date will be                            determined after pathology results from today's                            exam become available for review. Arieh Bogue L. Loletha Carrow, MD 10/05/2019 10:27:00 AM This report has been signed electronically.

## 2019-10-05 NOTE — Patient Instructions (Signed)
YOU HAD AN ENDOSCOPIC PROCEDURE TODAY AT THE [facility]:   Refer to the procedure report that was given to you for any specific questions about what was found during the examination.  If the procedure report does not answer your questions, please call your gastroenterologist to clarify.  If you requested that your care partner not be given the details of your procedure findings, then the procedure report has been included in a sealed envelope for you to review at your convenience later.  YOU SHOULD EXPECT: Some feelings of bloating in the abdomen. Passage of more gas than usual.  Walking can help get rid of the air that was put into your GI tract during the procedure and reduce the bloating. If you had a lower endoscopy (such as a colonoscopy or flexible sigmoidoscopy) you may notice spotting of blood in your stool or on the toilet paper. If you underwent a bowel prep for your procedure, you may not have a normal bowel movement for a few days.  Please Note:  You might notice some irritation and congestion in your nose or some drainage.  This is from the oxygen used during your procedure.  There is no need for concern and it should clear up in a day or so.  SYMPTOMS TO REPORT IMMEDIATELY:   Following lower endoscopy (colonoscopy or flexible sigmoidoscopy):  Excessive amounts of blood in the stool  Significant tenderness or worsening of abdominal pains  Swelling of the abdomen that is new, acute  Fever of 100F or higher  For urgent or emergent issues, a gastroenterologist can be reached at any hour by calling [phone]. Do not use MyChart messaging for urgent concerns.    DIET:  We do recommend a small meal at first, but then you may proceed to your regular diet.  Drink plenty of fluids but you should avoid alcoholic beverages for 24 hours.  ACTIVITY:  You should plan to take it easy for the rest of today and you should NOT DRIVE or use heavy machinery until tomorrow (because  of the sedation medicines used during the test).    FOLLOW UP: Our staff will call the number listed on your records 48-72 hours following your procedure to check on you and address any questions or concerns that you may have regarding the information given to you following your procedure. If we do not reach you, we will leave a message.  We will attempt to reach you two times.  During this call, we will ask if you have developed any symptoms of COVID 19. If you develop any symptoms (ie: fever, flu-like symptoms, shortness of breath, cough etc.) before then, please call [phone].  If you test positive for Covid 19 in the 2 weeks post procedure, please call and report this information to us.    If any biopsies were taken you will be contacted by phone or by letter within the next 1-3 weeks.  Please call us at [phone] if you have not heard about the biopsies in 3 weeks.    SIGNATURES/CONFIDENTIALITY: You and/or your care partner have signed paperwork which will be entered into your electronic medical record.  These signatures attest to the fact that that the information above on your After Visit Summary has been reviewed and is understood.  Full responsibility of the confidentiality of this discharge information lies with you and/or your care-partner. 

## 2019-10-05 NOTE — Progress Notes (Signed)
Called to room to assist during endoscopic procedure.  Patient ID and intended procedure confirmed with present staff. Received instructions for my participation in the procedure from the performing physician.  

## 2019-10-05 NOTE — Progress Notes (Signed)
Ephedrine given for hypotension, Dr Loletha Carrow updated. Report given to PACU, vss

## 2019-10-07 ENCOUNTER — Telehealth: Payer: Self-pay

## 2019-10-07 NOTE — Telephone Encounter (Signed)
Attempted to reach patient for post-procedure f/u call. No answer. Left message that we will make another attempt to reach her later today and for her to please not hesitate to call us if she has any questions/concerns regarding her care. 

## 2019-10-07 NOTE — Telephone Encounter (Signed)
NOTES ON FILE FROM [facility] [phone], SENT REFERRAL TO SCHEDULING

## 2019-10-07 NOTE — Telephone Encounter (Signed)
  Follow up Call-  Call back number 10/05/2019  Post procedure Call Back phone  # [phone]  Permission to leave phone message Yes     Patient questions:  Do you have a fever, pain , or abdominal swelling? No. Pain Score  0 *  Have you tolerated food without any problems? Yes.    Have you been able to return to your normal activities? Yes.    Do you have any questions about your discharge instructions: Diet   No. Medications  No. Follow up visit  No.  Do you have questions or concerns about your Care? No.  Actions: * If pain score is 4 or above: No action needed, pain <4.  1. Have you developed a fever since your procedure? no  2.   Have you had an respiratory symptoms (SOB or cough) since your procedure? no  3.   Have you tested positive for COVID 19 since your procedure no  4.   Have you had any family members/close contacts diagnosed with the COVID 19 since your procedure?  no   If yes to any of these questions please route to Joylene John, RN and Erenest Rasher, RN

## 2019-10-10 ENCOUNTER — Telehealth: Payer: Self-pay | Admitting: Neurology

## 2019-10-10 NOTE — Telephone Encounter (Signed)
Mcarthur Rossetti Josem Kaufmann:  [id number] (exp. 10/10/19 to 11/09/19)/medicaid order sent to GI. They will reach out to the patient to schedule.

## 2019-10-11 ENCOUNTER — Encounter: Payer: Self-pay | Admitting: Gastroenterology

## 2019-10-11 ENCOUNTER — Telehealth: Payer: Self-pay

## 2019-10-11 NOTE — Telephone Encounter (Signed)
LVM for pt to call me back to schedule sleep study  

## 2019-10-18 ENCOUNTER — Other Ambulatory Visit: Payer: Self-pay

## 2019-10-18 ENCOUNTER — Ambulatory Visit
Admission: RE | Admit: 2019-10-18 | Discharge: 2019-10-18 | Disposition: A | Discharge status: Home / Self Care | Payer: Medicare HMO | Source: Ambulatory Visit | Attending: Neurology | Admitting: Neurology

## 2019-10-18 DIAGNOSIS — M4802 Spinal stenosis, cervical region: Secondary | ICD-10-CM | POA: Diagnosis not present

## 2019-10-18 DIAGNOSIS — R29898 Other symptoms and signs involving the musculoskeletal system: Secondary | ICD-10-CM | POA: Diagnosis not present

## 2019-10-18 DIAGNOSIS — R269 Unspecified abnormalities of gait and mobility: Secondary | ICD-10-CM

## 2019-10-23 ENCOUNTER — Ambulatory Visit (INDEPENDENT_AMBULATORY_CARE_PROVIDER_SITE_OTHER): Payer: Medicare HMO | Admitting: Neurology

## 2019-10-23 DIAGNOSIS — G4719 Other hypersomnia: Secondary | ICD-10-CM

## 2019-10-23 DIAGNOSIS — G4733 Obstructive sleep apnea (adult) (pediatric): Secondary | ICD-10-CM | POA: Diagnosis not present

## 2019-10-23 DIAGNOSIS — R0683 Snoring: Secondary | ICD-10-CM

## 2019-10-23 NOTE — Progress Notes (Deleted)
Cardiology Office Note:   Date:  10/23/2019  NAME:  Wendy Leblanc    MRN: [id number] DOB:  08-06-1960   PCP:  Vassie Moment, MD  Cardiologist:  No primary care provider on file.  Electrophysiologist:  None   Referring MD: Vassie Moment, MD   No chief complaint on file. ***  History of Present Illness:   Wendy Leblanc is a 59 y.o. female with a hx of CVA, HTN who is being seen today for the evaluation of SOB at the request of Vassie Moment, MD.  Past Medical History: Past Medical History:  Diagnosis Date  . Anxiety   . Arthritis   . Carpal tunnel syndrome   . Cataract   . CVA (cerebral vascular accident) ([place])   . Depression   . Gait abnormality   . Hyperlipidemia   . Hypertension   . Migraine   . Osteoarthritis   . Smoker   . Stroke [facility]) 2019    Past Surgical History: Past Surgical History:  Procedure Laterality Date  . ABDOMINAL HYSTERECTOMY    . CARPAL TUNNEL RELEASE    . hemorrhoid surgery    . TUBAL LIGATION      Current Medications: No outpatient medications have been marked as taking for the 10/25/19 encounter (Appointment) with O'Neal, Cassie Freer, MD.     Allergies:    Penicillins, Contrast media [iodinated diagnostic agents], and Sulfur   Social History: Social History   Socioeconomic History  . Marital status: Widowed    Spouse name: Not on file  . Number of children: 2  . Years of education: Not on file  . Highest education level: 12th grade  Occupational History  . Not on file  Tobacco Use  . Smoking status: Current Every Day Smoker    Packs/day: 0.50    Types: Cigarettes  . Smokeless tobacco: Never Used  Vaping Use  . Vaping Use: Former  Substance and Sexual Activity  . Alcohol use: Not Currently  . Drug use: Never  . Sexual activity: Not on file  Other Topics Concern  . Not on file  Social History Narrative  . Not on file   Social Determinants of Health   Financial Resource Strain:   . Difficulty of Paying  Living Expenses:   Food Insecurity:   . Worried About Charity fundraiser in the Last Year:   . Arboriculturist in the Last Year:   Transportation Needs:   . Film/video editor (Medical):   Marland Kitchen Lack of Transportation (Non-Medical):   Physical Activity:   . Days of Exercise per Week:   . Minutes of Exercise per Session:   Stress:   . Feeling of Stress :   Social Connections:   . Frequency of Communication with Friends and Family:   . Frequency of Social Gatherings with Friends and Family:   . Attends Religious Services:   . Active Member of Clubs or Organizations:   . Attends Archivist Meetings:   Marland Kitchen Marital Status:      Family History: The patient's ***family history includes Breast cancer in her mother and sister; Diabetes in her brother and sister; Heart disease in her mother. There is no history of Colon cancer, Esophageal cancer, Rectal cancer, Stomach cancer, or Colon polyps.  ROS:   All other ROS reviewed and negative. Pertinent positives noted in the HPI.     EKGs/Labs/Other Studies Reviewed:   The following studies were personally reviewed by me today:  EKG:  EKG is *** ordered today.  The ekg ordered today demonstrates ***, and was personally reviewed by me.   TTE 01/24/2018 - Left ventricle: The cavity size was normal. Wall thickness was  normal. Systolic function was normal. The estimated ejection  fraction was in the range of 60% to 65%. Left ventricular  diastolic function parameters were normal.   Recent Labs: No results found for requested labs within last 8760 hours.   Recent Lipid Panel    Component Value Date/Time   CHOL 115 01/24/2018 0918   TRIG 32 01/24/2018 0918   HDL 51 01/24/2018 0918   CHOLHDL 2.3 01/24/2018 0918   VLDL 6 01/24/2018 0918   LDLCALC 58 01/24/2018 0918    Physical Exam:   VS:  There were no vitals taken for this visit.   Wt Readings from Last 3 Encounters:  10/05/19 202 lb (91.6 kg)  09/30/19 202 lb 8 oz  (91.9 kg)  09/21/19 202 lb (91.6 kg)    General: Well nourished, well developed, in no acute distress Heart: Atraumatic, normal size  Eyes: PEERLA, EOMI  Neck: Supple, no JVD Endocrine: No thryomegaly Cardiac: Normal S1, S2; RRR; no murmurs, rubs, or gallops Lungs: Clear to auscultation bilaterally, no wheezing, rhonchi or rales  Abd: Soft, nontender, no hepatomegaly  Ext: No edema, pulses 2+ Musculoskeletal: No deformities, BUE and BLE strength normal and equal Skin: Warm and dry, no rashes   Neuro: Alert and oriented to person, place, time, and situation, CNII-XII grossly intact, no focal deficits  Psych: Normal mood and affect   ASSESSMENT:   Wendy Leblanc is a 59 y.o. female who presents for the following: No diagnosis found.  PLAN:   There are no diagnoses linked to this encounter.  Disposition: No follow-ups on file.  Medication Adjustments/Labs and Tests Ordered: Current medicines are reviewed at length with the patient today.  Concerns regarding medicines are outlined above.  No orders of the defined types were placed in this encounter.  No orders of the defined types were placed in this encounter.   There are no Patient Instructions on file for this visit.   Time Spent with Patient: I have spent a total of *** minutes with patient reviewing hospital notes, telemetry, EKGs, labs and examining the patient as well as establishing an assessment and plan that was discussed with the patient.  > 50% of time was spent in direct patient care.  Signed, Addison Naegeli. Audie Box, [place]  [address], [place], [place] [address] [phone]  10/23/2019 11:54 AM

## 2019-10-25 ENCOUNTER — Telehealth: Payer: Self-pay | Admitting: Neurology

## 2019-10-25 ENCOUNTER — Ambulatory Visit: Payer: Medicare HMO | Admitting: Cardiovascular Disease

## 2019-10-25 NOTE — Telephone Encounter (Signed)
I discussed the MRI results with her.  She has significant degenerative changes but there is no spinal cord compression that would affect gait.  She is scheduled to return to see me in 2 weeks for a nerve conduction/EMG study.  I also discussed the sleep studies with her.  She has moderate sleep apnea.  She would like to go on CPAP.  Emma, Please set up AutoPap 5-20 with home DME

## 2019-10-25 NOTE — Progress Notes (Signed)
PATIENT'S NAME:  Wendy Leblanc, Wendy Leblanc DOB:      1960/06/24      MR#:    [id number]     DATE OF RECORDING: 10/23/2019 REFERRING M.D.:  Vassie Moment, MD Study Performed:   Baseline Polysomnogram  HISTORY:  [name] is a 59 year old woman with snoring, EDS and witnessed OSA.   EPWORTH SLEEPINESS SCALE   On a scale of 0 - 3 what is the chance of dozing:   Sitting and Reading:                          3 Watching TV:                                      3 Sitting inactive in a public place:       2 Passenger in car for one hour:          3 Lying down to rest in the afternoon:  3 Sitting and talking to someone:         2 Sitting quietly after lunch:                  3 In a car, stopped in traffic:                 0 (does not snore)   Total (out of 24): 19/24  Severe sleepiness   The patient's weight 202 pounds with a height of 63 (inches), resulting in a BMI of 35.9 kg/m2.   CURRENT MEDICATIONS: Tylenol, Norvasc, Lipitor, Vitamin D3, Flexeril, Lexapro, Topamax.   PROCEDURE:  This is a multichannel digital polysomnogram utilizing the Somnostar 11.2 system.  Electrodes and sensors were applied and monitored per AASM Specifications.   EEG, EOG, Chin and Limb EMG, were sampled at 200 Hz.  ECG, Snore and Nasal Pressure, Thermal Airflow, Respiratory Effort, CPAP Flow and Pressure, Oximetry was sampled at 50 Hz. Digital video and audio were recorded.      BASELINE STUDY  Lights Out was at 21:32 and Lights On at 04:59.  Total recording time (TRT) was 448 minutes, with a total sleep time (TST) of 308 minutes.   The patient's sleep latency was 32.5 minutes.  REM latency was 111.5 minutes.  The sleep efficiency was 68.8 %.     SLEEP ARCHITECTURE: WASO (Wake after sleep onset) was 124.5 minutes.  There were 11 minutes in Stage N1, 152 minutes Stage N2, 69 minutes Stage N3 and 76 minutes in Stage REM.  The percentage of Stage N1 was 3.6%, Stage N2 was 49.4%, Stage N3 was 22.4% and Stage R (REM sleep)  was 24.7%.  The arousals were noted as: 72 were spontaneous, 0 were associated with PLMs, 29 were associated with respiratory events.    RESPIRATORY ANALYSIS:  There were a total of 130 respiratory events:  13 obstructive apneas, 3 central apneas and 0 mixed apneas with a total of 16 apneas and an apnea index (AI) of 3.1 /hour. There were 114 hypopneas with a hypopnea index of 22.2 /hour. The patient also had 0 respiratory event related arousals (RERAs).      The total APNEA/HYPOPNEA INDEX (AHI) was 25.3/hour and the total RESPIRATORY DISTURBANCE INDEX was  25.3 /hour.  71 events occurred in REM sleep and 109 events in NREM. The REM AHI was  56.1 /hour, versus a non-REM  AHI of 15.3. The patient spent 149 minutes of total sleep time in the supine position and 159 minutes in non-supine.. The supine AHI was 29.0 versus a non-supine AHI of 21.8.  OXYGEN SATURATION & C02:  The Wake baseline 02 saturation was 94%, with the lowest being 76%. Time spent below 89% saturation equaled 52 minutes.  PERIODIC LIMB MOVEMENTS:   The patient had a total of 0 Periodic Limb Movements.  The Periodic Limb Movement (PLM) index was 0 and the PLM Arousal index was 0/hour.  OTHER Audio and video analysis did not show any abnormal or unusual movements, behaviors, phonations or vocalizations.   EKG showed normal sinus rhythm (NSR).   Post-study, the patient indicated that sleep was the same as usual.    IMPRESSION:   Moderate Obstructive Sleep Apnea (OSA) with an AHI = 25.3.   Severe REM associated OSA with a REM-AHI = 56.1.  Reduced sleep efficiency of 68% due to reduced sleep maintenance.   RECOMMENDATIONS:  Advise home Auto PAP 5-15 cm H2O pressure with a heated humidifier and download in 30 - 90 days or a full-night, attended, CPAP titration study to optimize therapy.   Follow-Up with Dr. Felecia Shelling   I certify that I have reviewed the entire raw data recording prior to the issuance of this report in accordance  with the Standards of Accreditation of the American Academy of Sleep Medicine (AASM)   Juanell Fairly, PhD Diplomat, American Board of Psychiatry and Neurology,  Sleep Medicine    Demographics and Medical History           Name: Wendy Leblanc, Wendy Leblanc Age: 31 BMI: 35.9 Interp Physician: Arlice Colt, MD  DOB: January 31, 1961 Ht-IN: 63 CM: 160 Referred By: Vassie Moment, MD  Pt. Tag:  Wt-LB: 202 KG: 92 Tested By: Fredirick Maudlin, RPSGT  Pt. #: [id number] Sex: Female Scored By: Fredirick Maudlin, RPSGT  Bed Tag: Room #4 Race: African American    Sleep Summary    Sleep Time Statistics Minutes Hours    Time in Bed 448    7.5    Total Sleep Time 308    5.1    Total Sleep Time NREM 232    3.9    Total Sleep Time REM 76    1.3    Sleep Onset 12.5    0.2    Wake After Sleep Onset 124.5    2.1    Wake After Sleep Period 3    0.1    Latency Persistent Sleep 32.5    0.5    Sleep Efficiency 68.8 Percent    Lights out 21:32     Lights on 04:59    Sleep Disruption Events Count Index    Arousals 103 20.1    Awakenings 0 0    Arousals + Awakenings 103 20.1    REM Awakenings 0 0.0     Sleep Stage Statistics Wake N1 N2 N3 REM    Percent Stage to SPT 28.8  2.5  35.1  16.0  17.6  Percent   Sleep Period Time in Stage 124.5 11 152 69 76 Minutes   Latency to Stage  12.5 13.5 29.5 111.5 Minutes   Percent Stage to TST  3.6 49.4 22.4 24.7 Percent   EKG Summary          EKG Statistics         Heart Rate, Wake 87.5 BPM  TST Epochs in HR Interval 0 < 29   Heart Rate, Steady Sleep Avg  70 BPM   5 30-59   PAC Events 0 Count   541 60-79   PVC Events 0 Count   69 80-99   Bradycardia 0 Count   1 100-119   Tachycardia 0 Count   0 120-139        0 140-159    NREM REM   0 > 160   Shortest R-R .6 .6       Longest R-R 1. 1.        Respiration Summary  Event Statistics Total  With Arousal  With Awakening    Count Index  Count Index  Count Index   Apneas, Total 16 3.1  6 1.2   0 0.0     Hypopneas, Total 114 22.2  23 4.5   0 0.0    Apnea + Hypopnea Index 130 25.3   29 5.6   0 0.0    Apneas, Supine 9 3.6     Apneas, Non Supine 7 2.6     Hypopneas, Supine 63 25.4     Hypopneas, Non Supine 51 19.2     % Sleep Apnea .9 Percent     % Sleep Hypopnea 6.2 Percent    Oximetry Statistics       SpO2, Mean Wake 94 Percent     SpO2, Minimum 76 Percent     SpO2, Max 95 Percent     SpO2, Mean 89 Percent            Desaturation Index, REM 52.9  Index     Desaturation Index, NREM 14.7  Index     Desaturation Index, Total 24.2  Index             SpO2 Intervals > 89% 80-89% 70-79% 60-69% 50-59% 40-49% 30-39% < 30%  308 Percent Sleep Time 47.4 52.1 0.5 0 0 0 0 0  Body Position Statistics   Back Side L Side R Side Prone    Total Sleep Time   149 159.0 39.5 119.5 0 Minutes   Percent Time to TST   48.4  51.6  12.8  38.8  0.0  Percent   Number of Events   72 58.0 23 35 0 Count   Number of Apneas   9 7 0 7 0 Count   Number of Hypopneas   63 51 23 28 0 Count   Apnea Index   3.6  2.6  0.0  3.5  0.0  Index   Hypopnea Index   25.4  19.2  34.9  14.1  0.0  Index   Apnea + Hypopnea Index   29.0  21.9  34.9  17.6  0.0  Index  Respiration Events    Non REM, Pre Rx Statistics Non Supine  Supine    Central Mixed Obstr  Central Mixed Obstr   Apneas 3 0 3  0 0 0 Count  Apneas, Minimum SpO2 89 0 89  0 0 0 Percent     Hypopneas 0 0 31  0 0 22 Count  Hypopneas, Minimum SpO2 0 0 82  0 0 85 Percent     Apnea + Hypopneas Index 1.4 0.0 15.5  0.0 0.0 13.2 Index    REM, Pre Rx Statistics Non Supine  Supine    Central Mixed Obstr  Central Mixed Obstr   Apneas 0 0 1  0 0 9 Count  Apneas, Minimum SpO2 0 0 85  0 0 79 Percent     Hypopneas 0 0 20  0  0 41 Count  Hypopneas, Minimum SpO2 0 0 83  0 0 76 Percent     Apnea + Hypopnea Index 0.0 0.0 46.6  0.0 0.0 61.2 Index  Leg Movement Summary    PLM Non REM (Incl. Wake) REM Total    No Arousal Arousal Wake No Arousal  Arousal Wake No Arousal Arousal Wake Total   Isolated 0 2 0 2 0 0 2 2 0 4    PLMS 0 0 0 0 0 0 0 0 0 0    Total 0 2 0 2 0 0 2 2 0 4   PLM Statistics PLMS Total     Count Index Count Index    PLM 0 0 4 0.8     PLM with Arousal 0 0 2 0.4     PLM, with Wake 0 0 0 0.0     PLM, Arousal + Wake 0 0.0 2 0.4     PLM, No Arousal 0 0.0  2 0.4     PLM, Non REM 0 0.0  2 0.5     PLM, REM 0 0.0  2 1.6     Technician Comments:  Patient came in for a Baseline PSG sleep study. Patient slept on back and sides. Sleep was observed in all stages. EKG NSR. Nocturia x1. Moderate snoring detected. Patient took PM medications before bed.

## 2019-10-26 ENCOUNTER — Other Ambulatory Visit: Payer: Self-pay | Admitting: *Deleted

## 2019-10-26 ENCOUNTER — Encounter: Payer: Self-pay | Admitting: *Deleted

## 2019-10-26 DIAGNOSIS — G4733 Obstructive sleep apnea (adult) (pediatric): Secondary | ICD-10-CM

## 2019-10-26 NOTE — Telephone Encounter (Signed)
Called pt and scheduled initial autopap f/u for 01/03/20 at 9:30am. Advised Wendy Leblanc should bring machine to this initial appt. Advised orders will be sent to [facility]. Provided her their phone number: [phone]. Wendy Leblanc will reach out to them if Wendy Leblanc does not hear about getting set up in the next week or two. I faxed orders to them at [phone]. Received fax confirmation. Also sent letter to pt.

## 2019-10-31 ENCOUNTER — Ambulatory Visit: Payer: Medicare HMO | Admitting: Neurology

## 2019-11-08 ENCOUNTER — Encounter: Payer: Medicare HMO | Admitting: Neurology

## 2019-11-22 NOTE — Telephone Encounter (Addendum)
Called pt back to get further information. Pt is concerned about her neck. Every time she coughs/sneezes/laughs/turns heas, she has a sharp pain. This is has been present but it is getting worse since last week. Denies any injury. Denies starting any other new med recently. She just started autopap last night. Advised I will send message to Dr. Felecia Shelling and call back with his recommendation.

## 2019-11-22 NOTE — Telephone Encounter (Signed)
PT called and states she has more questions about MRI results, she is still experiencing neck pain, would like call back from nurse

## 2019-11-22 NOTE — Telephone Encounter (Signed)
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  The degenerative changes look similar to what was on the CT scan in 2019.  We could refer to physical therapy.

## 2019-11-23 NOTE — Telephone Encounter (Signed)
Called and spoke with pt. Relayed Dr. Garth Bigness message. She verbalized understanding and is agreeable to try physical therapy. She is going to call her insurance first to see who is in network and will call back to let us know who she wants to be referred to.

## 2019-11-25 ENCOUNTER — Encounter: Payer: Self-pay | Admitting: Cardiovascular Disease

## 2019-11-25 ENCOUNTER — Other Ambulatory Visit: Payer: Self-pay

## 2019-11-25 ENCOUNTER — Ambulatory Visit (INDEPENDENT_AMBULATORY_CARE_PROVIDER_SITE_OTHER): Payer: Medicare HMO | Admitting: Cardiovascular Disease

## 2019-11-25 VITALS — BP 115/74 | HR 81 | Ht 63.0 in | Wt 200.0 lb

## 2019-11-25 DIAGNOSIS — F17200 Nicotine dependence, unspecified, uncomplicated: Secondary | ICD-10-CM

## 2019-11-25 DIAGNOSIS — G4733 Obstructive sleep apnea (adult) (pediatric): Secondary | ICD-10-CM

## 2019-11-25 DIAGNOSIS — I739 Peripheral vascular disease, unspecified: Secondary | ICD-10-CM

## 2019-11-25 DIAGNOSIS — E7800 Pure hypercholesterolemia, unspecified: Secondary | ICD-10-CM | POA: Diagnosis not present

## 2019-11-25 DIAGNOSIS — R072 Precordial pain: Secondary | ICD-10-CM

## 2019-11-25 DIAGNOSIS — R079 Chest pain, unspecified: Secondary | ICD-10-CM

## 2019-11-25 DIAGNOSIS — I10 Essential (primary) hypertension: Secondary | ICD-10-CM | POA: Diagnosis not present

## 2019-11-25 DIAGNOSIS — Z01818 Encounter for other preprocedural examination: Secondary | ICD-10-CM

## 2019-11-25 MED ORDER — PREDNISONE 50 MG PO TABS: ORAL_TABLET | ORAL | 0 refills | Status: DC

## 2019-11-25 MED ORDER — METOPROLOL TARTRATE 100 MG PO TABS: ORAL_TABLET | ORAL | 0 refills | Status: DC

## 2019-11-25 MED ORDER — DIPHENHYDRAMINE HCL 50 MG PO TABS: ORAL_TABLET | ORAL | 0 refills | Status: DC

## 2019-11-25 NOTE — Patient Instructions (Addendum)
Medication Instructions:  Take '100mg'$  Metoprolol (Lopressor) 1 Hour prior to Coronary CTA DO NOT take lisinopril/Hydroclothiazide the day of CTA    *If you need a refill on your cardiac medications before your next appointment, please call your pharmacy*   Lab Work: BMET with in 7 Days of CTA If you have labs (blood work) drawn today and your tests are completely normal, you will receive your results only by: Marland Kitchen MyChart Message (if you have MyChart) OR . A paper copy in the mail If you have any lab test that is abnormal or we need to change your treatment, we will call you to review the results.   Testing/Procedures: Your physician has requested that you have a lower extremity arterial duplex. During this test, ultrasound is used to evaluate arterial blood flow in the legs. Allow one hour for this exam. There are no restrictions or special instructions. This will take place at [place], [address].     Follow-Up: At [facility], you and your health needs are our priority.  As part of our continuing mission to provide you with exceptional heart care, we have created designated Provider Care Teams.  These Care Teams include your primary Cardiologist (physician) and Advanced Practice Providers (APPs -  Physician Assistants and Nurse Practitioners) who all work together to provide you with the care you need, when you need it.  We recommend signing up for the patient portal called "MyChart".  Sign up information is provided on this After Visit Summary.  MyChart is used to connect with patients for Virtual Visits (Telemedicine).  Patients are able to view lab/test results, encounter notes, upcoming appointments, etc.  Non-urgent messages can be sent to your provider as well.   To learn more about what you can do with MyChart, go to [email or web address].    Your next appointment:   2 month(s)  The format for your next appointment:   Virtual Visit   Provider:   Sanda Klein,  MD   Other Instructions Your cardiac CT will be scheduled at one of the below locations:   [facility] [address] [place], [place] [address] [phone]  [place] [address] [place], [place] [address] [phone]  If scheduled at [facility], please arrive at the [facility] main entrance of [facility] 30 minutes prior to test start time. Proceed to the [facility] Radiology Department (first floor) to check-in and test prep.  If scheduled at [facility], please arrive 15 mins early for check-in and test prep.  Please follow these instructions carefully (unless otherwise directed):   On the Night Before the Test: . Be sure to Drink plenty of water. . Do not consume any caffeinated/decaffeinated beverages or chocolate 12 hours prior to your test. . Do not take any antihistamines 12 hours prior to your test. . If the patient has contrast allergy: ? Patient will need a prescription for Prednisone and very clear instructions (as follows): 1. Prednisone 50 mg - take 13 hours prior to test 2. Take another Prednisone 50 mg 7 hours prior to test 3. Take another Prednisone 50 mg 1 hour prior to test 4. Take Benadryl 50 mg 1 hour prior to test . Patient must complete all four doses of above prophylactic medications. . Patient will need a ride after test due to Benadryl.  On the Day of the Test: . Drink plenty of water. Do not drink  any water within one hour of the test. . Do not eat any food 4 hours prior to the test. . You may take your regular medications prior to the test.  . Take metoprolol (Lopressor) two hours prior to test. . HOLD Furosemide/Hydrochlorothiazide morning of the test. . FEMALES- please wear underwire-free bra if available       After the Test: . Drink plenty of water. . After receiving IV contrast, you may experience a mild flushed feeling.  This is normal. . On occasion, you may experience a mild rash up to 24 hours after the test. This is not dangerous. If this occurs, you can take Benadryl 25 mg and increase your fluid intake. . If you experience trouble breathing, this can be serious. If it is severe call 911 IMMEDIATELY. If it is mild, please call our office. . If you take any of these medications: Glipizide/Metformin, Avandament, Glucavance, please do not take 48 hours after completing test unless otherwise instructed.   Once we have confirmed authorization from your insurance company, we will call you to set up a date and time for your test. Based on how quickly your insurance processes prior authorizations requests, please allow up to 4 weeks to be contacted for scheduling your Cardiac CT appointment. Be advised that routine Cardiac CT appointments could be scheduled as many as 8 weeks after your provider has ordered it.  For non-scheduling related questions, please contact the cardiac imaging nurse navigator should you have any questions/concerns: Marchia Bond, Cardiac Imaging Nurse Navigator Burley Saver, Interim Cardiac Imaging Nurse [facility] Direct Office Dial: [phone]   For scheduling needs, including cancellations and rescheduling, please call Vivien Rota at [phone], option 3.

## 2019-11-27 ENCOUNTER — Encounter: Payer: Self-pay | Admitting: Cardiovascular Disease

## 2019-11-27 DIAGNOSIS — I739 Peripheral vascular disease, unspecified: Secondary | ICD-10-CM | POA: Insufficient documentation

## 2019-11-27 DIAGNOSIS — F17200 Nicotine dependence, unspecified, uncomplicated: Secondary | ICD-10-CM | POA: Insufficient documentation

## 2019-11-27 DIAGNOSIS — E7800 Pure hypercholesterolemia, unspecified: Secondary | ICD-10-CM | POA: Insufficient documentation

## 2019-11-27 NOTE — Progress Notes (Signed)
Cardiology Consultation Note:    Date:  11/27/2019   ID:  Wendy Leblanc, DOB 1961/04/20, MRN [id number]  PCP:  Vassie Moment, MD  [facility] Cardiologist:  [place] Electrophysiologist:  None   Referring MD: Vassie Moment, MD   Chief Complaint  Patient presents with  . Chest Pain  . Leg Pain  Wendy Leblanc is a 59 y.o. female who is being seen today for the evaluation of exertional chest pain and leg pain at the request of Vassie Moment, MD.   History of Present Illness:    Wendy Leblanc is a 59 y.o. female with a hx of essential hypertension and hyperlipidemia and a family history of "heart disease" referred for several complaints.  For a few months now she has noticed chest discomfort underneath her left breast that usually occurs with physical activity such as climbing stairs, although she is occasionally notice it at rest.  She describes it as "gas".  She also becomes short of breath with roughly 1 flight of stairs.  In addition, she has had problems with pain in her left calf, like a charley horse, that occurs when she is walking in the store.  She has no known history of CAD or PAD.  She denies palpitations, dizziness, syncope, leg edema, recent focal neurological complications.  She scores 19/24 points on the Epworth Sleepiness Scale and is being evaluated for sleep apnea at [facility] neurology (Dr. Felecia Shelling).  In 2019 she had a "stroke" (syncope followed by transient left-sided weakness, no stroke on MRI).  Carotid CT angiography did not show any evidence of extracranial or intracranial obstruction.  Echo showed LVEF of 60-65% and no major abnormalities.  The left atrium was not dilated and there were no major valvular abnormalities.  She underwent recent ABI screening and was found to have reduced ABI on the right at 0.7 with normal ABI on the left 1.14.  Her blood pressure is well controlled.  She takes a statin and her LDL cholesterol is 58.  Her  hemoglobin A1c is borderline at 5.7%, without any medications for diabetes.  She smokes about a half a pack of cigarettes a day.  She lives with her daughter who also smokes.  She does not really know about the details of her family's health, but she reports that her dad died in his 56s after having a "seizure in his sleep".  He had previously had a heart attack.  She has a sister with a murmur.  Her mother has heart problems at age 70.  Past Medical History:  Diagnosis Date  . Anxiety   . Arthritis   . Carpal tunnel syndrome   . Cataract   . CVA (cerebral vascular accident) ([place])   . Depression   . Gait abnormality   . Hyperlipidemia   . Hypertension   . Migraine   . Osteoarthritis   . Smoker   . Stroke [facility]) 2019    Past Surgical History:  Procedure Laterality Date  . ABDOMINAL HYSTERECTOMY    . CARPAL TUNNEL RELEASE    . hemorrhoid surgery    . TUBAL LIGATION      Current Medications: Current Meds  Medication Sig  . acetaminophen (TYLENOL) 500 MG tablet Take 500 mg by mouth every 6 (six) hours as needed.  Marland Kitchen amLODipine (NORVASC) 10 MG tablet Take 10 mg by mouth daily.  Marland Kitchen aspirin EC 81 MG tablet Take 81 mg by mouth daily.  Marland Kitchen atorvastatin (LIPITOR) 20 MG tablet  Take 20 mg by mouth daily.  . Cholecalciferol (VITAMIN D3 PO) Take by mouth. 1000u daily  . cyclobenzaprine (FLEXERIL) 5 MG tablet Take 5 mg by mouth 3 (three) times daily as needed.  Marland Kitchen escitalopram (LEXAPRO) 10 MG tablet Take 10 mg by mouth daily.  Marland Kitchen imipramine (TOFRANIL) 25 MG tablet Take 1 tablet (25 mg total) by mouth at bedtime.  Marland Kitchen lisinopril-hydrochlorothiazide (PRINZIDE,ZESTORETIC) 20-25 MG tablet Take 0.5 tablets by mouth daily.   Marland Kitchen topiramate (TOPAMAX) 25 MG tablet Take 25 mg by mouth 2 (two) times daily.     Allergies:   Penicillins, Contrast media [iodinated diagnostic agents], and Sulfur   Social History   Socioeconomic History  . Marital status: Widowed    Spouse name: Not on file  . Number of  children: 2  . Years of education: Not on file  . Highest education level: 12th grade  Occupational History  . Not on file  Tobacco Use  . Smoking status: Current Every Day Smoker    Packs/day: 0.50    Types: Cigarettes  . Smokeless tobacco: Never Used  Vaping Use  . Vaping Use: Former  Substance and Sexual Activity  . Alcohol use: Not Currently  . Drug use: Never  . Sexual activity: Not on file  Other Topics Concern  . Not on file  Social History Narrative  . Not on file   Social Determinants of Health   Financial Resource Strain:   . Difficulty of Paying Living Expenses:   Food Insecurity:   . Worried About Charity fundraiser in the Last Year:   . Arboriculturist in the Last Year:   Transportation Needs:   . Film/video editor (Medical):   Marland Kitchen Lack of Transportation (Non-Medical):   Physical Activity:   . Days of Exercise per Week:   . Minutes of Exercise per Session:   Stress:   . Feeling of Stress :   Social Connections:   . Frequency of Communication with Friends and Family:   . Frequency of Social Gatherings with Friends and Family:   . Attends Religious Services:   . Active Member of Clubs or Organizations:   . Attends Archivist Meetings:   Marland Kitchen Marital Status:      Family History: The patient's family history includes Breast cancer in her mother and sister; Diabetes in her brother and sister; Heart disease in her mother. There is no history of Colon cancer, Esophageal cancer, Rectal cancer, Stomach cancer, or Colon polyps.  ROS:   Please see the history of present illness.     All other systems reviewed and are negative.  EKGs/Labs/Other Studies Reviewed:    The following studies were reviewed today: Echocardiogram 01/24/2018 - Left ventricle: The cavity size was normal. Wall thickness was  normal. Systolic function was normal. The estimated ejection  fraction was in the range of 60% to 65%. Left ventricular  diastolic function  parameters were normal.   Notes, CT angiography 2019  EKG:  EKG is  ordered today.  The ekg ordered today demonstrates normal sinus rhythm, nonspecific T wave flattening and borderline QTC 455 ms.  Recent Labs: No results found for requested labs within last 8760 hours.  07/25/2019 Hemoglobin 13.6, creatinine 0.83, potassium 3.7, normal liver function tests, glucose 91, hemoglobin A1c 5.8% Recent Lipid Panel    Component Value Date/Time   CHOL 115 01/24/2018 0918   TRIG 32 01/24/2018 0918   HDL 51 01/24/2018 0918   CHOLHDL 2.3  01/24/2018 0918   VLDL 6 01/24/2018 0918   LDLCALC 58 01/24/2018 0918   07/25/2019 Total cholesterol 154, triglycerides 132, HDL 58, LDL 73  Physical Exam:    VS:  BP 115/74   Pulse 81   Ht '5\' 3"'$  (1.6 m)   Wt 200 lb (90.7 kg)   SpO2 99%   BMI 35.43 kg/m     Wt Readings from Last 3 Encounters:  11/25/19 200 lb (90.7 kg)  10/05/19 202 lb (91.6 kg)  09/30/19 202 lb 8 oz (91.9 kg)     GEN: Severely obese, well nourished, well developed in no acute distress HEENT: Normal NECK: No JVD; No carotid bruits LYMPHATICS: No lymphadenopathy CARDIAC: RRR, no murmurs, rubs, gallops.  Diminished pedal pulses on the left. RESPIRATORY:  Clear to auscultation without rales, wheezing or rhonchi  ABDOMEN: Soft, non-tender, non-distended MUSCULOSKELETAL:  No edema; No deformity  SKIN: Warm and dry NEUROLOGIC:  Alert and oriented x 3 PSYCHIATRIC:  Normal affect   ASSESSMENT:    1. Exertional chest pain   2. Intermittent claudication ([place])   3. Essential hypertension   4. Hypercholesterolemia   5. Severe obesity (BMI 35.0-39.9) with comorbidity ([place])   6. Smoking   7. OSA (obstructive sleep apnea)   8. Pre-op evaluation    PLAN:    In order of problems listed above:  1. Exertional chest pain: Sounds very suspicious for effort angina.  We will schedule coronary CT angiogram (she has a history of rash with iodine contrast in the past so we will  premedicate).  Aspirin 81 mg daily.  Continue statin.  If we prescribe beta-blockers long-term, prefer carvedilol since she may also have PAD. 2. Intermittent claudication: Has an abnormal ABI in the leg that gives her trouble, making it virtually certain that she has lower extremity arterial stenoses.  Check a full Doppler ultrasound study. 3. HLP: Target LDL cholesterol less than 70 (on recent labs LDL was 73). 4. HTN: Adequate control on current medications.  On RAAS inhibitor and thiazide diuretic, which she will hold on the day of her coronary CTA.  Consider replacing the amlodipine with carvedilol if we identify CAD and PAD. 5. Severe obesity: Prediabetes based on her labs.  Weight loss would be highly beneficial for many of her risk factors. 6. Smoking: Discussed the adverse effect of smoking on progression of vascular disease and or formation of collaterals.  Strongly encourage smoking cessation.  We will have to enlist the participation of her daughter since they live in the same household. 7. OSA: Evaluation in progress at [facility] neurology.   Medication Adjustments/Labs and Tests Ordered: Current medicines are reviewed at length with the patient today.  Concerns regarding medicines are outlined above.  Orders Placed This Encounter  Procedures  . CT CORONARY MORPH W/CTA COR W/SCORE W/CA W/CM &/OR WO/CM  . CT CORONARY FRACTIONAL FLOW RESERVE DATA PREP  . CT CORONARY FRACTIONAL FLOW RESERVE FLUID ANALYSIS  . Basic metabolic panel  . EKG 12-Lead  . VAS [place] LOWER EXTREMITY ARTERIAL DUPLEX   Meds ordered this encounter  Medications  . metoprolol tartrate (LOPRESSOR) 100 MG tablet    Sig: Take 1 hour prior to CT    Dispense:  1 tablet    Refill:  0    For pretesting only- no refills  . predniSONE (DELTASONE) 50 MG tablet    Sig: Take 1 Tablet every 6 hours starting the night before the CT. Take last dose before you leave for the  test.    Dispense:  3 tablet    Refill:  0  .  diphenhydrAMINE (BENADRYL) 50 MG tablet    Sig: Take with your last prednisone tablet  before leaving for test.    Dispense:  1 tablet    Refill:  0    Patient Instructions  Medication Instructions:  Take '100mg'$  Metoprolol (Lopressor) 1 Hour prior to Coronary CTA DO NOT take lisinopril/Hydroclothiazide the day of CTA    *If you need a refill on your cardiac medications before your next appointment, please call your pharmacy*   Lab Work: BMET with in 7 Days of CTA If you have labs (blood work) drawn today and your tests are completely normal, you will receive your results only by: Marland Kitchen MyChart Message (if you have MyChart) OR . A paper copy in the mail If you have any lab test that is abnormal or we need to change your treatment, we will call you to review the results.   Testing/Procedures: Your physician has requested that you have a lower extremity arterial duplex. During this test, ultrasound is used to evaluate arterial blood flow in the legs. Allow one hour for this exam. There are no restrictions or special instructions. This will take place at [place], [address].     Follow-Up: At [facility], you and your health needs are our priority.  As part of our continuing mission to provide you with exceptional heart care, we have created designated Provider Care Teams.  These Care Teams include your primary Cardiologist (physician) and Advanced Practice Providers (APPs -  Physician Assistants and Nurse Practitioners) who all work together to provide you with the care you need, when you need it.  We recommend signing up for the patient portal called "MyChart".  Sign up information is provided on this After Visit Summary.  MyChart is used to connect with patients for Virtual Visits (Telemedicine).  Patients are able to view lab/test results, encounter notes, upcoming appointments, etc.  Non-urgent messages can be sent to your provider as well.   To learn more about what you can  do with MyChart, go to [email or web address].    Your next appointment:   2 month(s)  The format for your next appointment:   Virtual Visit   Provider:   Sanda Klein, MD   Other Instructions Your cardiac CT will be scheduled at one of the below locations:   [facility] [address] [place], [place] [address] [phone]  [place] [address] [place],  [address] [phone]  If scheduled at [facility], please arrive at the [facility] main entrance of [facility] 30 minutes prior to test start time. Proceed to the [facility] Radiology Department (first floor) to check-in and test prep.  If scheduled at [facility], please arrive 15 mins early for check-in and test prep.  Please follow these instructions carefully (unless otherwise directed):   On the Night Before the Test: . Be sure to Drink plenty of water. . Do not consume any caffeinated/decaffeinated beverages or chocolate 12 hours prior to your test. . Do not take any antihistamines 12 hours prior to your test. . If the patient has contrast allergy: ? Patient will need a prescription for Prednisone and very clear instructions (as follows): 1. Prednisone 50 mg - take 13 hours prior to test 2. Take another Prednisone 50 mg 7 hours prior to test 3. Take  another Prednisone 50 mg 1 hour prior to test 4. Take Benadryl 50 mg 1 hour prior to test . Patient must complete all four doses of above prophylactic medications. . Patient will need a ride after test due to Benadryl.  On the Day of the Test: . Drink plenty of water. Do not drink any water within one hour of the test. . Do not eat any food 4 hours prior to the test. . You may take your regular medications prior to the test.  . Take metoprolol (Lopressor) two hours prior to test. . HOLD Furosemide/Hydrochlorothiazide morning of the  test. . FEMALES- please wear underwire-free bra if available       After the Test: . Drink plenty of water. . After receiving IV contrast, you may experience a mild flushed feeling. This is normal. . On occasion, you may experience a mild rash up to 24 hours after the test. This is not dangerous. If this occurs, you can take Benadryl 25 mg and increase your fluid intake. . If you experience trouble breathing, this can be serious. If it is severe call 911 IMMEDIATELY. If it is mild, please call our office. . If you take any of these medications: Glipizide/Metformin, Avandament, Glucavance, please do not take 48 hours after completing test unless otherwise instructed.   Once we have confirmed authorization from your insurance company, we will call you to set up a date and time for your test. Based on how quickly your insurance processes prior authorizations requests, please allow up to 4 weeks to be contacted for scheduling your Cardiac CT appointment. Be advised that routine Cardiac CT appointments could be scheduled as many as 8 weeks after your provider has ordered it.  For non-scheduling related questions, please contact the cardiac imaging nurse navigator should you have any questions/concerns: Marchia Bond, Cardiac Imaging Nurse Navigator Burley Saver, Interim Cardiac Imaging Nurse [place] [facility] Direct Office Dial: [phone]   For scheduling needs, including cancellations and rescheduling, please call Vivien Rota at [phone], option 3.       Signed, Sanda Klein, MD  11/27/2019 3:40 PM    [place]

## 2019-12-05 ENCOUNTER — Encounter (HOSPITAL_BASED_OUTPATIENT_CLINIC_OR_DEPARTMENT_OTHER): Payer: Self-pay

## 2019-12-05 ENCOUNTER — Emergency Department (HOSPITAL_BASED_OUTPATIENT_CLINIC_OR_DEPARTMENT_OTHER)
Admission: EM | Admit: 2019-12-05 | Discharge: 2019-12-05 | Disposition: A | Discharge status: Home / Self Care | Payer: Medicare HMO | Attending: Emergency Medicine | Admitting: Emergency Medicine

## 2019-12-05 ENCOUNTER — Other Ambulatory Visit: Payer: Self-pay

## 2019-12-05 DIAGNOSIS — Z7982 Long term (current) use of aspirin: Secondary | ICD-10-CM | POA: Insufficient documentation

## 2019-12-05 DIAGNOSIS — Z79899 Other long term (current) drug therapy: Secondary | ICD-10-CM | POA: Insufficient documentation

## 2019-12-05 DIAGNOSIS — Z8673 Personal history of transient ischemic attack (TIA), and cerebral infarction without residual deficits: Secondary | ICD-10-CM | POA: Insufficient documentation

## 2019-12-05 DIAGNOSIS — M542 Cervicalgia: Secondary | ICD-10-CM | POA: Insufficient documentation

## 2019-12-05 DIAGNOSIS — F17210 Nicotine dependence, cigarettes, uncomplicated: Secondary | ICD-10-CM | POA: Diagnosis not present

## 2019-12-05 DIAGNOSIS — I10 Essential (primary) hypertension: Secondary | ICD-10-CM | POA: Insufficient documentation

## 2019-12-05 MED ORDER — LIDOCAINE 5 % EX PTCH
2.0000 | MEDICATED_PATCH | CUTANEOUS | Status: DC
  Administered 2019-12-05: 2 via TRANSDERMAL
  Filled 2019-12-05: qty 2

## 2019-12-05 MED ORDER — HYDROCODONE-ACETAMINOPHEN 5-325 MG PO TABS
1.0000 | ORAL_TABLET | Freq: Once | ORAL | Status: AC
  Administered 2019-12-05: 1 via ORAL
  Filled 2019-12-05: qty 1

## 2019-12-05 NOTE — ED Provider Notes (Signed)
[facility] EMERGENCY DEPARTMENT Provider Note   CSN: [id number] Arrival date & time: 12/05/19  1638     History Chief Complaint  Patient presents with  . Neck Pain    Wendy Leblanc is a 59 y.o. female.  59 year old female with complaint of pain in the right and left sides of her neck, chronic in nature, no acute injuries. Patients she has been going to a specialist for her neck and back pain, has had an MRI and is scheduled for a nerve study tomorrow. Patient has been taking Tylenol for her pain which usually helps, has not been helping for the past 3 days. No new symptoms, no other complaints or concerns.         Past Medical History:  Diagnosis Date  . Anxiety   . Arthritis   . Carpal tunnel syndrome   . Cataract   . CVA (cerebral vascular accident) ([place])   . Depression   . Gait abnormality   . Hyperlipidemia   . Hypertension   . Migraine   . Osteoarthritis   . Smoker   . Stroke [facility]) 2019    Patient Active Problem List   Diagnosis Date Noted  . Hypercholesterolemia 11/27/2019  . Severe obesity (BMI 35.0-39.9) with comorbidity ([place]) 11/27/2019  . Smoking 11/27/2019  . Intermittent claudication ([place]) 11/27/2019  . TIA (transient ischemic attack) 01/24/2018  . Essential hypertension 01/24/2018  . Depression 01/24/2018    Past Surgical History:  Procedure Laterality Date  . ABDOMINAL HYSTERECTOMY    . CARPAL TUNNEL RELEASE    . hemorrhoid surgery    . TUBAL LIGATION       OB History    Gravida  3   Para      Term      Preterm      AB  1   Living  2     SAB  1   TAB      Ectopic      Multiple      Live Births  2           Family History  Problem Relation Age of Onset  . Breast cancer Mother   . Heart disease Mother   . Breast cancer Sister   . Diabetes Brother   . Diabetes Sister   . Colon cancer Neg Hx   . Esophageal cancer Neg Hx   . Rectal cancer Neg Hx   . Stomach cancer Neg Hx   . Colon polyps Neg Hx      Social History   Tobacco Use  . Smoking status: Current Every Day Smoker    Packs/day: 0.50    Types: Cigarettes  . Smokeless tobacco: Never Used  Vaping Use  . Vaping Use: Former  Substance Use Topics  . Alcohol use: Not Currently  . Drug use: Never    Home Medications Prior to Admission medications   Medication Sig Start Date End Date Taking? Authorizing Provider  acetaminophen (TYLENOL) 500 MG tablet Take 500 mg by mouth every 6 (six) hours as needed.    [provider]  amLODipine (NORVASC) 10 MG tablet Take 10 mg by mouth daily. 12/29/17   [provider]  aspirin EC 81 MG tablet Take 81 mg by mouth daily.    [provider]  atorvastatin (LIPITOR) 20 MG tablet Take 20 mg by mouth daily.    [provider]  Cholecalciferol (VITAMIN D3 PO) Take by mouth. 1000u daily  [provider]  cyclobenzaprine (FLEXERIL) 5 MG tablet Take 5 mg by mouth 3 (three) times daily as needed. 04/03/19   [provider]  diphenhydrAMINE (BENADRYL) 50 MG tablet Take with your last prednisone tablet  before leaving for test. 11/25/19   Croitoru, Dani Gobble, MD  escitalopram (LEXAPRO) 10 MG tablet Take 10 mg by mouth daily.    [provider]  imipramine (TOFRANIL) 25 MG tablet Take 1 tablet (25 mg total) by mouth at bedtime. 09/30/19   Sater, Nanine Means, MD  lisinopril-hydrochlorothiazide (PRINZIDE,ZESTORETIC) 20-25 MG tablet Take 0.5 tablets by mouth daily.  12/29/17   [provider]  metoprolol tartrate (LOPRESSOR) 100 MG tablet Take 1 hour prior to CT 11/25/19   Croitoru, Mihai, MD  predniSONE (DELTASONE) 50 MG tablet Take 1 Tablet every 6 hours starting the night before the CT. Take last dose before you leave for the test. 11/25/19   Croitoru, Dani Gobble, MD  topiramate (TOPAMAX) 25 MG tablet Take 25 mg by mouth 2 (two) times daily. 12/29/17   [provider]    Allergies    Penicillins, Contrast media [iodinated diagnostic  agents], and Sulfur  Review of Systems   Review of Systems  Constitutional: Negative for fever.  Respiratory: Negative for shortness of breath.   Cardiovascular: Negative for chest pain.  Musculoskeletal: Positive for neck pain.  Skin: Negative for rash and wound.  Allergic/Immunologic: Negative for immunocompromised state.  Neurological: Negative for weakness and numbness.    Physical Exam Updated Vital Signs BP 114/77 (BP Location: Right Arm)   Pulse 78   Temp 97.9 F (36.6 C)   Resp 18   SpO2 100%   Physical Exam Vitals and nursing note reviewed.  Constitutional:      General: She is not in acute distress.    Appearance: She is well-developed. She is not diaphoretic.  HENT:     Head: Normocephalic and atraumatic.  Pulmonary:     Effort: Pulmonary effort is normal.  Musculoskeletal:        General: Tenderness present. No swelling or deformity.       Back:  Skin:    General: Skin is warm and dry.     Findings: No erythema or rash.  Neurological:     Mental Status: She is alert and oriented to person, place, and time.     Sensory: No sensory deficit.     Motor: No weakness.  Psychiatric:        Behavior: Behavior normal.     ED Results / Procedures / Treatments   Labs (all labs ordered are listed, but only abnormal results are displayed) Labs Reviewed - No data to display  EKG None  Radiology No results found.  Procedures Procedures (including critical care time)  Medications Ordered in ED Medications  lidocaine (LIDODERM) 5 % 2 patch (has no administration in time range)  HYDROcodone-acetaminophen (NORCO/VICODIN) 5-325 MG per tablet 1 tablet (has no administration in time range)    ED Course  I have reviewed the triage vital signs and the nursing notes.  Pertinent labs & imaging results that were available during my care of the patient were reviewed by me and considered in my medical decision making (see chart for details).  Clinical Course as  of Dec 04 2020  Mon Aug 02, 548  7543 59 year old female with acute on chronic neck pain.  On exam found to have tenderness left right trapezius areas as well as left lower back.  Denies recent falls  or injuries or changes in her chronic symptoms.  Patient is scheduled to have a nerve conduction study tomorrow.  Plan is to apply Lidoderm patches to left right trapezius areas, given Norco for her pain and recommend she contact her prescriber tomorrow to discuss further need for further pain management.   [LM]    Clinical Course User Index [LM] Roque Lias   MDM Rules/Calculators/A&P                          Final Clinical Impression(s) / ED Diagnoses Final diagnoses:  Neck pain    Rx / DC Orders ED Discharge Orders    None       Roque Lias 12/05/19 2022    Fredia Sorrow, MD 12/12/19 [phone]

## 2019-12-05 NOTE — Discharge Instructions (Signed)
Follow-up as planned tomorrow for your nerve conduction study. Contact your provider to discuss pain management options.

## 2019-12-05 NOTE — ED Triage Notes (Addendum)
Pt states she is having pain to "left side (neck) on down-hurting all over"-denies fever-states she had a cough last week only when she "got choked on something"-NAD-to triage in w/c-pt later added that she is having "a nerve study tomorrow" for the left side neck pain and requested pain med-advised unable to give pain meds until being seen by EDP

## 2019-12-06 ENCOUNTER — Telehealth: Payer: Self-pay | Admitting: Neurology

## 2019-12-06 ENCOUNTER — Ambulatory Visit (INDEPENDENT_AMBULATORY_CARE_PROVIDER_SITE_OTHER): Payer: Medicare HMO | Admitting: Neurology

## 2019-12-06 DIAGNOSIS — M4802 Spinal stenosis, cervical region: Secondary | ICD-10-CM

## 2019-12-06 DIAGNOSIS — R29898 Other symptoms and signs involving the musculoskeletal system: Secondary | ICD-10-CM

## 2019-12-06 DIAGNOSIS — R208 Other disturbances of skin sensation: Secondary | ICD-10-CM

## 2019-12-06 DIAGNOSIS — M542 Cervicalgia: Secondary | ICD-10-CM | POA: Diagnosis not present

## 2019-12-06 DIAGNOSIS — G4733 Obstructive sleep apnea (adult) (pediatric): Secondary | ICD-10-CM | POA: Diagnosis not present

## 2019-12-06 DIAGNOSIS — G5603 Carpal tunnel syndrome, bilateral upper limbs: Secondary | ICD-10-CM | POA: Diagnosis not present

## 2019-12-06 DIAGNOSIS — R269 Unspecified abnormalities of gait and mobility: Secondary | ICD-10-CM

## 2019-12-06 MED ORDER — GABAPENTIN 300 MG PO CAPS
ORAL_CAPSULE | ORAL | 5 refills | Status: DC
Start: 2019-12-06 — End: 2020-08-23

## 2019-12-06 NOTE — Telephone Encounter (Signed)
Called pt back to further discuss. She does not know the name of pain med. Unsure who prescribed it, does not have med bottle to referance. I reviewed her chart and saw that [facility] prescribed hydrocodone 07/19/19 #12. I recommended she check med bottle to see who prescribed pain med and f/u with the doctor who prescribed it.

## 2019-12-06 NOTE — Progress Notes (Signed)
[facility]  PATIENT: Wendy Leblanc DOB: 12/09/1960  REFERRING DOCTOR OR PCP:  Vassie Moment, MD SOURCE: patient, notes from PCP, lab reports, MRI of the brain.  _________________________________   HISTORICAL  CHIEF COMPLAINT:  Chief Complaint  Patient presents with  . Pain  . Sleep Apnea    HISTORY OF PRESENT ILLNESS:  Wendy Leblanc is a 59 year old woman with neck pain, arm pain and foot numbness.  Additionally she has excessive sleepiness and was recently found to have obstructive sleep apnea called  Due to neck pain, she went to the emergency.  Room yesterday.  In the emergency room she was given hydrocodone and Lidoderm patch  Since the last visit, she has had several tests detailed below.  Symptoms in the arms are most likely due to the degenerative changes in the neck associated with radiculopathy.  She did not find a benefit from imipramine that was started recently.  Therefore, I'll switch her to gabapentin 300 mg p.o. every morning, 300 mg p.o. every afternoon and 600mg  p.o. nightly.  She has started AutoPap for her moderate obstructive sleep apnea.  She is tolerating CPAP well and feels less sleepy during the daytime.  MRI cervical spine 10/18/2019: IMPRESSION: This MRI of the cervical spine without contrast shows the following: 1.   At C3-C4, there is mild spinal stenosis due to reduced disc height, disc bulge and uncovertebral spurring.  This causes moderate right greater than left foraminal narrowing.  There is some encroachment upon the right C4 nerve root but no definite nerve root compression. 2.   At C4-C5, there is borderline spinal stenosis and mild foraminal narrowing but no nerve root compression. 3.   At C5-C6, there is a left paramedian disc protrusion and other degenerative changes.  The thecal sac is mildly distorted on the left without spinal cord compression.  There is no nerve root compression. 4.   At C6-C7, there are  degenerative changes causing moderate right greater than left foraminal narrowing though there does not appear to be nerve root compression.  There are some encroachment upon the right C7 nerve root without definite nerve root compression. 5.   The spinal cord has normal signal. 6.    There is edema within the endplates from C5-E5 which appears to be degenerative.  The C6 vertebral body is mildly reduced in height and I mild endplate compression fracture cannot be ruled out  Polysomnogram 10/23/2019 IMPRESSION: 1.  Moderate Obstructive Sleep Apnea (OSA) with an AHI = 25.3.   Severe REM associated OSA with a REM-AHI = 56.1.  2. Reduced sleep efficiency of 68% due to reduced sleep maintenance.   NCV/EMG study 12/06/2019 shows: This NCV/EMG study shows the following: 1.   Mild to moderate chronic right C7 radiculopathy and mild chronic left C6 radiculopathy.  There was no acute denervation noted. 2.   There is no evidence of a large fiber polyneuropathy.  However, the sympathetic skin response was absent in the foot but not the hand which could be seen with a mild small fiber polyneuropathy.  Beta-blockers and tricyclic antidepressants could also blunt the response, especially in the foot. 3.   Mild bilateral median neuropathies at the wrist (carpal tunnel syndromes)   REVIEW OF SYSTEMS: Constitutional: No fevers, chills, sweats, or change in appetite Eyes: No visual changes, double vision, eye pain Ear, nose and throat: No hearing loss, ear pain, nasal congestion, sore throat Cardiovascular: No chest pain, palpitations Respiratory: No shortness of breath at rest  or with exertion.   No wheezes GastrointestinaI: No nausea, vomiting, diarrhea, abdominal pain, fecal incontinence Genitourinary: No dysuria, urinary retention or frequency.  No nocturia. Musculoskeletal: No neck pain, back pain Integumentary: No rash, pruritus, skin lesions Neurological: as above Psychiatric: No depression at this  time.  No anxiety Endocrine: No palpitations, diaphoresis, change in appetite, change in weigh or increased thirst Hematologic/Lymphatic: No anemia, purpura, petechiae. Allergic/Immunologic: No itchy/runny eyes, nasal congestion, recent allergic reactions, rashes  ALLERGIES: Allergies  Allergen Reactions  . Penicillins     ANAPHYLAXIS   . Contrast Media [Iodinated Diagnostic Agents]     hives  . Sulfur     HIVES     HOME MEDICATIONS:  Current Outpatient Medications:  .  acetaminophen (TYLENOL) 500 MG tablet, Take 500 mg by mouth every 6 (six) hours as needed., Disp: , Rfl:  .  amLODipine (NORVASC) 10 MG tablet, Take 10 mg by mouth daily., Disp: , Rfl: 0 .  aspirin EC 81 MG tablet, Take 81 mg by mouth daily., Disp: , Rfl:  .  atorvastatin (LIPITOR) 20 MG tablet, Take 20 mg by mouth daily., Disp: , Rfl:  .  Cholecalciferol (VITAMIN D3 PO), Take by mouth. 1000u daily, Disp: , Rfl:  .  cyclobenzaprine (FLEXERIL) 5 MG tablet, Take 5 mg by mouth 3 (three) times daily as needed., Disp: , Rfl:  .  diphenhydrAMINE (BENADRYL) 50 MG tablet, Take with your last prednisone tablet  before leaving for test., Disp: 1 tablet, Rfl: 0 .  escitalopram (LEXAPRO) 10 MG tablet, Take 10 mg by mouth daily., Disp: , Rfl:  .  gabapentin (NEURONTIN) 300 MG capsule, One po q AM, one po q PM and 2 po qHS, Disp: 120 capsule, Rfl: 5 .  imipramine (TOFRANIL) 25 MG tablet, Take 1 tablet (25 mg total) by mouth at bedtime., Disp: 30 tablet, Rfl: 5 .  lisinopril-hydrochlorothiazide (PRINZIDE,ZESTORETIC) 20-25 MG tablet, Take 0.5 tablets by mouth daily. , Disp: , Rfl: 0 .  metoprolol tartrate (LOPRESSOR) 100 MG tablet, Take 1 hour prior to CT, Disp: 1 tablet, Rfl: 0 .  predniSONE (DELTASONE) 50 MG tablet, Take 1 Tablet every 6 hours starting the night before the CT. Take last dose before you leave for the test., Disp: 3 tablet, Rfl: 0 .  topiramate (TOPAMAX) 25 MG tablet, Take 25 mg by mouth 2 (two) times daily., Disp:  , Rfl: 0  PAST MEDICAL HISTORY: Past Medical History:  Diagnosis Date  . Anxiety   . Arthritis   . Carpal tunnel syndrome   . Cataract   . CVA (cerebral vascular accident) ([place])   . Depression   . Gait abnormality   . Hyperlipidemia   . Hypertension   . Migraine   . Osteoarthritis   . Smoker   . Stroke [facility]) 2019    PAST SURGICAL HISTORY: Past Surgical History:  Procedure Laterality Date  . ABDOMINAL HYSTERECTOMY    . CARPAL TUNNEL RELEASE    . hemorrhoid surgery    . TUBAL LIGATION      FAMILY HISTORY: Family History  Problem Relation Age of Onset  . Breast cancer Mother   . Heart disease Mother   . Breast cancer Sister   . Diabetes Brother   . Diabetes Sister   . Colon cancer Neg Hx   . Esophageal cancer Neg Hx   . Rectal cancer Neg Hx   . Stomach cancer Neg Hx   . Colon polyps Neg Hx  PHYSICAL EXAM  There were no vitals filed for this visit.  There is no height or weight on file to calculate BMI.   General: The patient is well-developed and well-nourished and in no acute distress  HEENT:  Head is [place]/AT.  Sclera are anicteric.  Neck:There is tenderness in the paraspinal muscles in the left rhomboid region.  There is reduced range of motion.  Skin: Extremities are without rash or  edema.   Neurologic Exam  Mental status: The patient is alert and oriented x 3 at the time of the examination. The patient has apparent normal recent and remote memory, with an apparently normal attention span and concentration ability.   Speech is normal.  Cranial nerves: Extraocular movements are full.   Motor:  Muscle bulk is normal.   Tone is normal. Strength is  5 / 5 in most muscles but 4+/5 in biceps, 4+ triceps and 4+ in deltoids and 4/5 left APB and 4+/5 right APB.     Sensory: Sensory testing is intact to pinprick, soft touch and vibration sensation in all 4 extremities proximally mild reduced sensation in thumb vs 5th finger bilaterally.    Mild reduced  vibration and pp at toes  Coordination: Cerebellar testing reveals good finger-nose-finger and heel-to-shin bilaterally.  Gait and station: Station is normal.   Gait is wide with reduced stride. Tandem gait is very poor. Romberg is negative.   Reflexes: Deep tendon reflexes are symmetric and 2 at brachioradilis, trace at biceps and 1 at triceps.   They are 1 at knees and 2 at ankles.  No clonus.     DIAGNOSTIC DATA (LABS, IMAGING, TESTING) - I reviewed patient records, labs, notes, testing and imaging myself where available.  Lab Results  Component Value Date   WBC 5.2 01/25/2018   HGB 11.8 (L) 01/25/2018   HCT 37.9 01/25/2018   MCV 90.9 01/25/2018   PLT 185 01/25/2018      Component Value Date/Time   NA 142 01/25/2018 0553   K 4.5 01/25/2018 0553   CL 110 01/25/2018 0553   CO2 28 01/25/2018 0553   GLUCOSE 91 01/25/2018 0553   BUN 12 01/25/2018 0553   CREATININE 0.96 01/25/2018 0553   CALCIUM 8.3 (L) 01/25/2018 0553   PROT 7.2 01/23/2018 1648   ALBUMIN 4.2 01/23/2018 1648   AST 39 01/23/2018 1648   ALT 14 01/23/2018 1648   ALKPHOS 85 01/23/2018 1648   BILITOT 0.8 01/23/2018 1648   GFRNONAA >60 01/25/2018 0553   GFRAA >60 01/25/2018 0553   Lab Results  Component Value Date   CHOL 115 01/24/2018   HDL 51 01/24/2018   LDLCALC 58 01/24/2018   TRIG 32 01/24/2018   CHOLHDL 2.3 01/24/2018   Lab Results  Component Value Date   HGBA1C 5.7 (H) 01/24/2018        ASSESSMENT AND PLAN  Cervical stenosis of spinal canal  Dysesthesia  OSA (obstructive sleep apnea)  Neck pain  1.   Much of her pain is coming from the degenerative changes in the cervical spine with radiculopathy, probably right C7 and left C6.  I will have her start gabapentin and titrate up to 1200 mg a day. 2.   Trigger point inject C4-C5 and C6-C7 paraspinal muscles bilaterally and left rhomboid with a total of 80 mg Depo-Medrol in 5 cc Marcaine using sterile technique.. 3.    Continue AutoPap  for OSA.   4.   She has a follow-up visit scheduled and will call  sooner if she has new or worsening neurologic symptoms.  Gadge Hermiz A. Felecia Shelling, MD, [facility] 09/11/1026, 9:02 PM Certified in Neurology, Clinical Neurophysiology, Sleep Medicine and Neuroimaging  [facility] [address] [place], [place] [address] [phone]

## 2019-12-06 NOTE — Telephone Encounter (Signed)
Pt has called stating she has tried to get a refill on her pain medication(pt does not recall the name of it).  She has been told she can not get it until 08-11, pt is asking to be called back explaining why.

## 2019-12-06 NOTE — Progress Notes (Signed)
Full Name: Wendy Leblanc Gender: Female MRN #: [id number] Date of Birth: [birth year]    Visit Date: 12/06/2019 07:27 Age: 59 Years Examining Physician: Arlice Colt, MD  Referring Physician: Arlice Colt, MD Height: 5 feet 3 inch    History: Wendy Leblanc is a 59 year old woman with neck pain and bilateral arm pain and weakness.  Current neck pain is worse on the left but last month pain was worse on the right.  She also notes tingling and pain in the feet.   Nerve conduction studies: The right median, ulnar, peroneal and tibial motor responses had normal distal latencies, amplitudes and conduction velocities.  The tibial and ulnar F-wave latencies were normal.  The median sensory responses were mildly prolonged across the wrist with normal amplitudes.  The right sural, superficial peroneal and ulnar sensory responses had normal peak latencies and amplitudes.  The galvanic sympathetic skin response was absent in the foot but normal in the hand.  Electromyography: Needle EMG of selected muscles of the arms showed moderate chronic denervation in the right extensor digitorum communis muscle and mild chronic denervation in the right triceps, flexor carpi ulnaris and left deltoid muscle.  Other muscles tested were normal or near normal.  There was no abnormal spontaneous activity.  Cervical paraspinal muscles were also evaluated.  There was no acute denervation noted.  Impression: This NCV/EMG study shows the following: 1.   Mild to moderate chronic right C7 radiculopathy and mild chronic left C6 radiculopathy.  There was no acute denervation noted. 2.   There is no evidence of a large fiber polyneuropathy.  However, the sympathetic skin response was absent in the foot but not the hand which could be seen with a mild small fiber polyneuropathy.  Beta-blockers and tricyclic antidepressants could also blunt the response, especially in the foot. 3.   Mild bilateral median neuropathies at the  wrist (carpal tunnel syndromes)   Wendy Leblanc A. Felecia Shelling, MD, PhD, FAAN Certified in Neurology, Clinical Neurophysiology, Sleep Medicine, Pain Medicine and Neuroimaging Director, [facility] [address], [place], [place] [address] [phone]    Verbal informed consent was obtained from the patient, patient was informed of potential risk of procedure, including bruising, bleeding, hematoma formation, infection, muscle weakness, muscle pain, numbness, among others.         [place]    Nerve / Sites Muscle Latency Ref. Amplitude Ref. Rel Amp Segments Distance Velocity Ref. Area    ms ms mV mV %  cm m/s m/s mVms  R Median - APB     Wrist APB 3.7 ?4.4 7.0 ?4.0 100 Wrist - APB 7   16.5     Upper arm APB 8.0  6.4  91.6 Upper arm - Wrist 23 54 ?49 15.3  R Ulnar - ADM     Wrist ADM 2.3 ?3.3 10.4 ?6.0 100 Wrist - ADM 7   26.9     B.Elbow ADM 5.6  8.3  79.7 B.Elbow - Wrist 21 64 ?49 25.6     A.Elbow ADM 7.1  7.7  92.9 A.Elbow - B.Elbow 10 65 ?49 25.0         A.Elbow - Wrist      R Peroneal - EDB     Ankle EDB 3.6 ?6.5 11.5 ?2.0 100 Ankle - EDB 9   26.7     Fib head EDB 9.1  10.3  89.5 Fib head - Ankle  27 49 ?44 24.8     Pop fossa EDB 11.3  10.1  97.6 Pop fossa - Fib head 10 47 ?44 24.0         Pop fossa - Ankle      R Tibial - AH     Ankle AH 3.7 ?5.8 6.9 ?4.0 100 Ankle - AH 9   12.1     Pop fossa AH 12.0  5.0  73.2 Pop fossa - Ankle 37 45 ?41 10.6             SSR    Nerve / Sites Latency   s  R Sympathetic - Palm     Palm 1.44  R Sympathetic - Foot     Foot NR            SNC    Nerve / Sites Rec. Site Peak Lat Ref.  Amp Ref. Segments Distance Peak Diff Ref.    ms ms V V  cm ms ms  R Sural - Ankle (Calf)     Calf Ankle 3.3 ?4.4 17 ?6 Calf - Ankle 14    R Superficial peroneal - Ankle     Lat leg Ankle 3.5 ?4.4 11 ?6 Lat leg - Ankle 14    R Median, Ulnar - Transcarpal comparison     Median  Palm Wrist 2.4 ?2.2 69 ?35 Median Palm - Wrist 8       Ulnar Palm Wrist 1.9 ?2.2 27 ?12 Ulnar Palm - Wrist 8          Median Palm - Ulnar Palm  0.5 ?0.4  R Median - Orthodromic (Dig II, Mid palm)     Dig II Wrist 3.5 ?3.4 10 ?10 Dig II - Wrist 13    L Median - Orthodromic (Dig II, Mid palm)     Dig II Wrist 3.8 ?3.4 21 ?10 Dig II - Wrist 13    R Ulnar - Orthodromic, (Dig V, Mid palm)     Dig V Wrist 2.6 ?3.1 8 ?5 Dig V - Wrist 14                   F  Wave    Nerve F Lat Ref.   ms ms  R Tibial - AH 48.6 ?56.0  R Ulnar - ADM 27.6 ?32.0         EMG Summary Table    Spontaneous MUAP Recruitment  Muscle IA Fib PSW Fasc Other Amp Dur. Poly Pattern  R. Deltoid Normal None None None _______ Normal Normal 1+ Normal  R. Triceps brachii Normal None None None _______ Increased Increased 2+ Reduced  R. Biceps brachii Normal None None None _______ Normal Normal Normal Normal  R. Extensor digitorum communis Normal None None None _______ Increased Increased 2+ Discrete  R. First dorsal interosseous Normal None None None _______ Normal Normal Normal Normal  R. Abductor pollicis brevis Normal None None None _______ Normal Normal Normal Normal  R. Flexor carpi ulnaris Normal None None None _______ Normal Normal 1+ Reduced  L. Deltoid Normal None None None _______ Normal Normal Normal Normal  L. Triceps brachii Normal None None None _______ Normal Normal 1+ Normal  L. Biceps brachii Normal None None None _______ Normal Normal Normal Normal  L. Extensor digitorum communis Normal None None None _______ Normal Normal 1+ Normal  L. First dorsal interosseous Normal None None None _______ Normal Normal 1+ Normal  R. Cerv paraspinal

## 2019-12-06 NOTE — Telephone Encounter (Signed)
Referral placed.

## 2019-12-06 NOTE — Telephone Encounter (Signed)
Pt would like physical therapy at [facility] because across the [address] from where she lives and would not have to get transportation. [facility] address [address] [place], [place] [address] Phone number: [phone], Fax number: [phone]

## 2019-12-09 ENCOUNTER — Telehealth: Payer: Self-pay | Admitting: Cardiovascular Disease

## 2019-12-09 NOTE — Telephone Encounter (Signed)
Patient called in to clarify on Prednisone tablet order and Lopressor Tablet Order. This is ordered for the Coronary CTA. Patient was Advised to start taking 1st tablet of Prednisone 13 Hours prior to test. 2nd tablet of prednisone 7 Hours prior to test. And 3rd tablet of prednisone 1 hour prior to test. Notified patient to take metoprolol tablet 2 hours prior to test. Patient also notified that she needs to take 50 mg benadryl 1 hour prior to test as well. The test has not been scheduled yet, but patient  Notified that when it is she can call back in to office with the date and time and a nurse can help set up the appropriate times to take the medications.   Patient verbalized understanding to all instructions given. Patient reassured she can call back when the test is schedule for further help with times to take medications if needed.

## 2019-12-09 NOTE — Telephone Encounter (Signed)
Patient called back returning Jenna's Call

## 2019-12-09 NOTE — Telephone Encounter (Signed)
New Message   Pt c/o medication issue:  1. Name of Medication: predniSONE (DELTASONE) 50 MG tablet metoprolol tartrate (LOPRESSOR) 100 MG tablet  2. How are you currently taking this medication (dosage and times per day)? Not yet taking   3. Are you having a reaction (difficulty breathing--STAT)? No   4. What is your medication issue? Pt is calling and is wondering when she needs to take the medication   Please call back

## 2019-12-16 ENCOUNTER — Encounter (HOSPITAL_COMMUNITY): Payer: Medicare HMO

## 2019-12-23 ENCOUNTER — Other Ambulatory Visit: Payer: Self-pay | Admitting: Cardiovascular Disease

## 2019-12-23 DIAGNOSIS — R079 Chest pain, unspecified: Secondary | ICD-10-CM

## 2019-12-23 DIAGNOSIS — Z01818 Encounter for other preprocedural examination: Secondary | ICD-10-CM

## 2019-12-23 DIAGNOSIS — I739 Peripheral vascular disease, unspecified: Secondary | ICD-10-CM

## 2020-01-02 ENCOUNTER — Ambulatory Visit (HOSPITAL_COMMUNITY)
Admission: RE | Admit: 2020-01-02 | Payer: Medicare HMO | Source: Ambulatory Visit | Attending: Cardiovascular Disease | Admitting: Cardiovascular Disease

## 2020-01-03 ENCOUNTER — Ambulatory Visit: Payer: Medicare HMO | Admitting: Family Medicine

## 2020-01-05 ENCOUNTER — Ambulatory Visit: Payer: Medicare HMO | Admitting: Family Medicine

## 2020-01-11 ENCOUNTER — Telehealth: Payer: Self-pay | Admitting: *Deleted

## 2020-01-11 NOTE — Telephone Encounter (Signed)
Left a message to call back if she had any questions about the pre test medications:  1. Prednisone 50 mg - take 13 hours prior to test 2. Take another Prednisone 50 mg 7 hours prior to test 3. Take another Prednisone 50 mg 1 hour prior to test 4. Take Benadryl 50 mg 1 hour prior to test  Patient must complete all four doses of above prophylactic medications.  Patient will need a ride after test due to Benadryl.

## 2020-01-16 NOTE — Telephone Encounter (Addendum)
The patient called in for instructions for the cardiac ct. Instructions given as listed below for the prednisone and benadryl.   She has not completed her lab work yet for the test so she stated she will come in tomorrow. She has been advised to call back with any questions.

## 2020-01-17 LAB — BASIC METABOLIC PANEL
BUN/Creatinine Ratio: 17 (ref 12–28)
BUN: 16 mg/dL (ref 8–27)
CO2: 28 mmol/L (ref 20–29)
Calcium: 9.1 mg/dL (ref 8.7–10.3)
Chloride: 101 mmol/L (ref 96–106)
Creatinine, Ser: 0.96 mg/dL (ref 0.57–1.00)
GFR calc Af Amer: 74 mL/min/{1.73_m2} (ref >59)
GFR calc non Af Amer: 64 mL/min/{1.73_m2} (ref >59)
Glucose: 86 mg/dL (ref 65–99)
Potassium: 3.7 mmol/L (ref 3.5–5.2)
Sodium: 143 mmol/L (ref 134–144)

## 2020-01-18 ENCOUNTER — Telehealth (HOSPITAL_COMMUNITY): Payer: Self-pay | Admitting: Emergency Medicine

## 2020-01-18 NOTE — Telephone Encounter (Signed)
Attempted to call patient regarding upcoming cardiac CT appointment. °Left message on voicemail with name and callback number °Lynard Postlewait RN Navigator Cardiac Imaging ° Heart and Vascular Services °[phone] Office °[phone] Cell ° °

## 2020-01-20 ENCOUNTER — Other Ambulatory Visit: Payer: Self-pay

## 2020-01-20 ENCOUNTER — Ambulatory Visit (HOSPITAL_COMMUNITY)
Admission: RE | Admit: 2020-01-20 | Discharge: 2020-01-20 | Disposition: A | Discharge status: Home / Self Care | Payer: Medicare HMO | Source: Ambulatory Visit | Attending: Cardiovascular Disease | Admitting: Cardiovascular Disease

## 2020-01-20 DIAGNOSIS — R079 Chest pain, unspecified: Secondary | ICD-10-CM

## 2020-01-20 DIAGNOSIS — E7800 Pure hypercholesterolemia, unspecified: Secondary | ICD-10-CM | POA: Diagnosis not present

## 2020-01-20 DIAGNOSIS — R918 Other nonspecific abnormal finding of lung field: Secondary | ICD-10-CM | POA: Insufficient documentation

## 2020-01-20 DIAGNOSIS — Z8249 Family history of ischemic heart disease and other diseases of the circulatory system: Secondary | ICD-10-CM | POA: Insufficient documentation

## 2020-01-20 DIAGNOSIS — I10 Essential (primary) hypertension: Secondary | ICD-10-CM | POA: Diagnosis not present

## 2020-01-20 DIAGNOSIS — F17200 Nicotine dependence, unspecified, uncomplicated: Secondary | ICD-10-CM | POA: Insufficient documentation

## 2020-01-20 MED ORDER — NITROGLYCERIN 0.4 MG SL SUBL
0.8000 mg | SUBLINGUAL_TABLET | Freq: Once | SUBLINGUAL | Status: AC
  Administered 2020-01-20: 0.8 mg via SUBLINGUAL

## 2020-01-20 MED ORDER — IOHEXOL 350 MG/ML SOLN
80.0000 mL | Freq: Once | INTRAVENOUS | Status: AC | PRN
  Administered 2020-01-20: 80 mL via INTRAVENOUS

## 2020-01-20 MED ORDER — NITROGLYCERIN 0.4 MG SL SUBL
SUBLINGUAL_TABLET | SUBLINGUAL | Status: AC
  Filled 2020-01-20: qty 2

## 2020-01-24 ENCOUNTER — Other Ambulatory Visit: Payer: Self-pay

## 2020-01-24 ENCOUNTER — Ambulatory Visit (HOSPITAL_COMMUNITY)
Admission: RE | Admit: 2020-01-24 | Discharge: 2020-01-24 | Disposition: A | Discharge status: Home / Self Care | Payer: Medicare HMO | Source: Ambulatory Visit | Attending: Cardiovascular Disease | Admitting: Cardiovascular Disease

## 2020-01-24 DIAGNOSIS — Z01818 Encounter for other preprocedural examination: Secondary | ICD-10-CM | POA: Insufficient documentation

## 2020-01-24 DIAGNOSIS — I739 Peripheral vascular disease, unspecified: Secondary | ICD-10-CM | POA: Insufficient documentation

## 2020-01-24 DIAGNOSIS — R079 Chest pain, unspecified: Secondary | ICD-10-CM | POA: Diagnosis present

## 2020-01-26 ENCOUNTER — Telehealth: Payer: Self-pay | Admitting: Cardiovascular Disease

## 2020-01-26 NOTE — Telephone Encounter (Signed)
Patient returning call in regards to doppler results.

## 2020-01-26 NOTE — Telephone Encounter (Signed)
Normal arterial flow in both legs  No signs of any coronary problems at all. Calcium score of zero predicts very good future cardaic outcomes. Would still strongly recommend stopping smoking. There is an extensive abnormality in her left lung that could represent an active pneumonia or a recent pneumonia. Has she had fever, chills, cough, COVID exposure? Even if the answer is "no", I would recommend COVID testing.  If no active symptoms and COVID test negative, needs referral to Pulmonary clinic to evaluate her chronic exertional dyspnea and the CT abnormality. If COVID positive, will need to reschedule her arterial Doppler studies from 09/21.    Pt aware of test results and will f/u with PCP re getting covid test done ./cy

## 2020-02-07 ENCOUNTER — Other Ambulatory Visit: Payer: Self-pay

## 2020-02-07 ENCOUNTER — Ambulatory Visit (INDEPENDENT_AMBULATORY_CARE_PROVIDER_SITE_OTHER): Payer: Medicare HMO | Admitting: Family Medicine

## 2020-02-07 ENCOUNTER — Encounter: Payer: Self-pay | Admitting: Family Medicine

## 2020-02-07 VITALS — BP 108/59 | HR 77 | Ht 63.0 in | Wt 206.0 lb

## 2020-02-07 DIAGNOSIS — M4802 Spinal stenosis, cervical region: Secondary | ICD-10-CM

## 2020-02-07 DIAGNOSIS — R269 Unspecified abnormalities of gait and mobility: Secondary | ICD-10-CM

## 2020-02-07 DIAGNOSIS — R208 Other disturbances of skin sensation: Secondary | ICD-10-CM

## 2020-02-07 DIAGNOSIS — G4733 Obstructive sleep apnea (adult) (pediatric): Secondary | ICD-10-CM | POA: Diagnosis not present

## 2020-02-07 DIAGNOSIS — G4719 Other hypersomnia: Secondary | ICD-10-CM

## 2020-02-07 DIAGNOSIS — Z9989 Dependence on other enabling machines and devices: Secondary | ICD-10-CM

## 2020-02-07 NOTE — Progress Notes (Signed)
I have read the note, and I agree with the clinical assessment and plan.  Anjel Perfetti A. Arkin Imran, MD, PhD, FAAN Certified in Neurology, Clinical Neurophysiology, Sleep Medicine, Pain Medicine and Neuroimaging  [facility] [address], [address] [place], [place] [address] [phone]  

## 2020-02-07 NOTE — Patient Instructions (Signed)
Please continue using your CPAP regularly. While your insurance requires that you use CPAP at least 4 hours each night on 70% of the nights, I recommend, that you not skip any nights and use it throughout the night if you can. Getting used to CPAP and staying with the treatment long term does take time and patience and discipline. Untreated obstructive sleep apnea when it is moderate to severe can have an adverse impact on cardiovascular health and raise her risk for heart disease, arrhythmias, hypertension, congestive heart failure, stroke and diabetes. Untreated obstructive sleep apnea causes sleep disruption, nonrestorative sleep, and sleep deprivation. This can have an impact on your day to day functioning and cause daytime sleepiness and impairment of cognitive function, memory loss, mood disturbance, and problems focussing. Using CPAP regularly can improve these symptoms.  Continue gabapentin 300mg  in am, 300mg  at lunch and 600mg  at dinner.   Follow up in 3-4 months    Sleep Apnea Sleep apnea affects breathing during sleep. It causes breathing to stop for a short time or to become shallow. It can also increase the risk of:  Heart attack.  Stroke.  Being very overweight (obese).  Diabetes.  Heart failure.  Irregular heartbeat. The goal of treatment is to help you breathe normally again. What are the causes? There are three kinds of sleep apnea:  Obstructive sleep apnea. This is caused by a blocked or collapsed airway.  Central sleep apnea. This happens when the brain does not send the right signals to the muscles that control breathing.  Mixed sleep apnea. This is a combination of obstructive and central sleep apnea. The most common cause of this condition is a collapsed or blocked airway. This can happen if:  Your throat muscles are too relaxed.  Your tongue and tonsils are too large.  You are overweight.  Your airway is too small. What increases the risk?  Being  overweight.  Smoking.  Having a small airway.  Being older.  Being female.  Drinking alcohol.  Taking medicines to calm yourself (sedatives or tranquilizers).  Having family members with the condition. What are the signs or symptoms?  Trouble staying asleep.  Being sleepy or tired during the day.  Getting angry a lot.  Loud snoring.  Headaches in the morning.  Not being able to focus your mind (concentrate).  Forgetting things.  Less interest in sex.  Mood swings.  Personality changes.  Feelings of sadness (depression).  Waking up a lot during the night to pee (urinate).  Dry mouth.  Sore throat. How is this diagnosed?  Your medical history.  A physical exam.  A test that is done when you are sleeping (sleep study). The test is most often done in a sleep lab but may also be done at home. How is this treated?   Sleeping on your side.  Using a medicine to get rid of mucus in your nose (decongestant).  Avoiding the use of alcohol, medicines to help you relax, or certain pain medicines (narcotics).  Losing weight, if needed.  Changing your diet.  Not smoking.  Using a machine to open your airway while you sleep, such as: ? An oral appliance. This is a mouthpiece that shifts your lower jaw forward. ? A CPAP device. This device blows air through a mask when you breathe out (exhale). ? An EPAP device. This has valves that you put in each nostril. ? A BPAP device. This device blows air through a mask when you breathe in (inhale)  and breathe out.  Having surgery if other treatments do not work. It is important to get treatment for sleep apnea. Without treatment, it can lead to:  High blood pressure.  Coronary artery disease.  In men, not being able to have an erection (impotence).  Reduced thinking ability. Follow these instructions at home: Lifestyle  Make changes that your doctor recommends.  Eat a healthy diet.  Lose weight if  needed.  Avoid alcohol, medicines to help you relax, and some pain medicines.  Do not use any products that contain nicotine or tobacco, such as cigarettes, e-cigarettes, and chewing tobacco. If you need help quitting, ask your doctor. General instructions  Take over-the-counter and prescription medicines only as told by your doctor.  If you were given a machine to use while you sleep, use it only as told by your doctor.  If you are having surgery, make sure to tell your doctor you have sleep apnea. You may need to bring your device with you.  Keep all follow-up visits as told by your doctor. This is important. Contact a doctor if:  The machine that you were given to use during sleep bothers you or does not seem to be working.  You do not get better.  You get worse. Get help right away if:  Your chest hurts.  You have trouble breathing in enough air.  You have an uncomfortable feeling in your back, arms, or stomach.  You have trouble talking.  One side of your body feels weak.  A part of your face is hanging down. These symptoms may be an emergency. Do not wait to see if the symptoms will go away. Get medical help right away. Call your local emergency services (911 in the [place] Do not drive yourself to the hospital. Summary  This condition affects breathing during sleep.  The most common cause is a collapsed or blocked airway.  The goal of treatment is to help you breathe normally while you sleep. This information is not intended to replace advice given to you by your health care provider. Make sure you discuss any questions you have with your health care provider. Document Revised: 02/05/2018 Document Reviewed: 12/15/2017 Elsevier Patient Education  [place].

## 2020-02-07 NOTE — Progress Notes (Signed)
PATIENT: Wendy Leblanc DOB: Dec 29, 1960  REASON FOR VISIT: follow up HISTORY FROM: patient  Chief Complaint  Patient presents with  . Follow-up    [facility]  . Sleep Apnea    pt is having no new sx or concerns     HISTORY OF PRESENT ILLNESS: Today 02/07/20 Wendy Leblanc is a 59 y.o. female here today for follow up for cervical stenosis and recently diagnosed OSA started on CPAP therapy.   She reports gabapentin has worked well for neck pain and dysesthesias. She is taking 300/300/600. No obvious adverse effects. No falls. She continues to use cane. She has participated in PT and feels this has been helpful. She has 12 more sessions.   She was diagnosed with moderate OSA. She reports doing well with CPAP therapy initially. She had cataract surgery, first on left eye then right, over the past month that has interrupted CPAP usage. She does note improvement in sleep quality and daytime energy when using CPAP consistently. No concerns with CPAP or supplies.   Compliance report dated 12/08/2019 through 02/05/2020 reveals that she used CPAP 25 of the past 60 days for compliance of 42%.  She is CPAP greater than 4 hours 9 of the past 60 days for compliance of 15%.  Average usage on days used was 3 hours and 31 minutes.  Residual AHI was 2.6 on 5 to 15 cm of water and an EPR of 3.  There was no significant leak noted.  HISTORY: (copied from Dr Garth Bigness note on 09/30/2019)  I had the pleasure of seeing your patient, Wendy Leblanc, at [facility] for neurologic consultation regarding her carpal tunnel syndrome and gait disturbance.  She is a 59 year old woman who has had multiple falls due to her legs giving out after she walks a while. She has been using a cane since a possible TIA in September 2019.  She feels she is slowly worsening.   She has neck pain, worse with bending.   She notes pain radiates into shoulders and down her arms into all fingers but more on the  thumb sie than 5th finger side.    She feels weak in both her arms and hands.   The right is weaker than the left now but in the past, the left was worse.    She has urinary frequency.   She has insomnia and wakes up every few hours (sometimes for pain and sometimes for bladder).    She had CTS and had a carpal tunnel release on the right.    Since she had no benefit she did not proceed with left CTR.  Vascular risks: Hypertension, borderline DM, hyperlipidemia, smoking (1/3 ppd now but up to 1 ppd in the past, starting age 70).   Possible OSA  In September 2019 she had chest pain and syncope.   When she regained consciousness, she had left side weakness which quickly improved to baseline.   No stroke was found on CT or MRI and no significant arterial stenosis.     She also has snoring, EDS and witnessed OSA.   EPWORTH SLEEPINESS SCALE  On a scale of 0 - 3 what is the chance of dozing:  Sitting and Reading:                          3 Watching TV:  3 Sitting inactive in a public place:       2 Passenger in car for one hour:          3 Lying down to rest in the afternoon:  3 Sitting and talking to someone:         2 Sitting quietly after lunch:                  3 In a car, stopped in traffic:                 0 (does not snore)  Total (out of 24): 19/24  Severe sleepiness   I personally reviewed the MRI of the brain dated 01/24/2018.  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   She also had a CT angiogram of the neck 01/23/2018.  I personally reviewed the images.  There were no significant arterial issues.  She did have significant degenerative changes from C3-C7 with reduced disc height as well as uncovertebral spurring, mild facet hypertrophy and possibility of spinal stenosis at these levels.  There were no acute findings.  I also reviewed recent lab work from 07/25/2019.  CBC CMP, lipid panel were  unremarkable.  TSH was normal but hemoglobin A1c was minimally elevated at 5.8    REVIEW OF SYSTEMS: Out of a complete 14 system review of symptoms, the patient complains only of the following symptoms, neck pain, imbalance, numbness tinging, fatigue and all other reviewed systems are negative.  ESS:13 FSS:47  ALLERGIES: Allergies  Allergen Reactions  . Penicillins     ANAPHYLAXIS   . Contrast Media [Iodinated Diagnostic Agents]     hives  . Sulfur     HIVES     HOME MEDICATIONS: Outpatient Medications Prior to Visit  Medication Sig Dispense Refill  . acetaminophen (TYLENOL) 500 MG tablet Take 500 mg by mouth every 6 (six) hours as needed.    Marland Kitchen amLODipine (NORVASC) 10 MG tablet Take 10 mg by mouth daily.  0  . aspirin EC 81 MG tablet Take 81 mg by mouth daily.    Marland Kitchen atorvastatin (LIPITOR) 20 MG tablet Take 20 mg by mouth daily.    . Cholecalciferol (VITAMIN D3 PO) Take by mouth. 1000u daily    . cyclobenzaprine (FLEXERIL) 5 MG tablet Take 5 mg by mouth 3 (three) times daily as needed.    . diphenhydrAMINE (BENADRYL) 50 MG tablet Take with your last prednisone tablet  before leaving for test. 1 tablet 0  . escitalopram (LEXAPRO) 10 MG tablet Take 10 mg by mouth daily.    Marland Kitchen gabapentin (NEURONTIN) 300 MG capsule One po q AM, one po q PM and 2 po qHS 120 capsule 5  . imipramine (TOFRANIL) 25 MG tablet Take 1 tablet (25 mg total) by mouth at bedtime. 30 tablet 5  . lisinopril-hydrochlorothiazide (PRINZIDE,ZESTORETIC) 20-25 MG tablet Take 0.5 tablets by mouth daily.   0  . metoprolol tartrate (LOPRESSOR) 100 MG tablet Take 1 hour prior to CT 1 tablet 0  . omeprazole (PRILOSEC) 20 MG capsule Take 20 mg by mouth daily.    . predniSONE (DELTASONE) 50 MG tablet Take 1 Tablet every 6 hours starting the night before the CT. Take last dose before you leave for the test. 3 tablet 0  . topiramate (TOPAMAX) 25 MG tablet Take 25 mg by mouth 2 (two) times daily.  0   No facility-administered  medications prior to visit.  PAST MEDICAL HISTORY: Past Medical History:  Diagnosis Date  . Anxiety   . Arthritis   . Carpal tunnel syndrome   . Cataract   . CVA (cerebral vascular accident) ([place])   . Depression   . Gait abnormality   . Hyperlipidemia   . Hypertension   . Migraine   . Osteoarthritis   . Smoker   . Stroke [facility]) 2019    PAST SURGICAL HISTORY: Past Surgical History:  Procedure Laterality Date  . ABDOMINAL HYSTERECTOMY    . CARPAL TUNNEL RELEASE    . hemorrhoid surgery    . TUBAL LIGATION      FAMILY HISTORY: Family History  Problem Relation Age of Onset  . Breast cancer Mother   . Heart disease Mother   . Breast cancer Sister   . Diabetes Brother   . Diabetes Sister   . Colon cancer Neg Hx   . Esophageal cancer Neg Hx   . Rectal cancer Neg Hx   . Stomach cancer Neg Hx   . Colon polyps Neg Hx     SOCIAL HISTORY: Social History   Socioeconomic History  . Marital status: Widowed    Spouse name: Not on file  . Number of children: 2  . Years of education: Not on file  . Highest education level: 12th grade  Occupational History  . Not on file  Tobacco Use  . Smoking status: Current Every Day Smoker    Packs/day: 0.50    Types: Cigarettes  . Smokeless tobacco: Never Used  Vaping Use  . Vaping Use: Former  Substance and Sexual Activity  . Alcohol use: Not Currently  . Drug use: Never  . Sexual activity: Not on file  Other Topics Concern  . Not on file  Social History Narrative  . Not on file   Social Determinants of Health   Financial Resource Strain:   . Difficulty of Paying Living Expenses: Not on file  Food Insecurity:   . Worried About Charity fundraiser in the Last Year: Not on file  . Ran Out of Food in the Last Year: Not on file  Transportation Needs:   . Lack of Transportation (Medical): Not on file  . Lack of Transportation (Non-Medical): Not on file  Physical Activity:   . Days of Exercise per Week: Not on file  .  Minutes of Exercise per Session: Not on file  Stress:   . Feeling of Stress : Not on file  Social Connections:   . Frequency of Communication with Friends and Family: Not on file  . Frequency of Social Gatherings with Friends and Family: Not on file  . Attends Religious Services: Not on file  . Active Member of Clubs or Organizations: Not on file  . Attends Archivist Meetings: Not on file  . Marital Status: Not on file  Intimate Partner Violence:   . Fear of Current or Ex-Partner: Not on file  . Emotionally Abused: Not on file  . Physically Abused: Not on file  . Sexually Abused: Not on file      PHYSICAL EXAM  Vitals:   02/07/20 1312  BP: (!) 108/59  Pulse: 77  Weight: 206 lb (93.4 kg)  Height: 5\' 3"  (1.6 m)   Body mass index is 36.49 kg/m.  Generalized: Well developed, in no acute distress  Cardiology: normal rate and rhythm, no murmur noted Respiratory: clear to auscultation bilaterally  Neurological examination  Mentation: Alert oriented to time, place, history taking.  Follows all commands speech and language fluent Cranial nerve II-XII: Pupils were equal round reactive to light. Extraocular movements were full, visual field were full on confrontational test. Facial sensation and strength were normal. Head turning and shoulder shrug  were normal and symmetric. Motor: The motor testing reveals 5 over 5 strength of all 4 extremities. Good symmetric motor tone is noted throughout.  Sensory: Sensory testing is intact to soft touch on all 4 extremities. No evidence of extinction is noted.  Coordination: Cerebellar testing reveals good finger-nose-finger and heel-to-shin bilaterally.  Gait and station: Gait is stable with cane  Reflexes: Deep tendon reflexes are symmetric and normal bilaterally.    DIAGNOSTIC DATA (LABS, IMAGING, TESTING) - I reviewed patient records, labs, notes, testing and imaging myself where available.  No flowsheet data found.   Lab  Results  Component Value Date   WBC 5.2 01/25/2018   HGB 11.8 (L) 01/25/2018   HCT 37.9 01/25/2018   MCV 90.9 01/25/2018   PLT 185 01/25/2018      Component Value Date/Time   NA 143 01/17/2020 0812   K 3.7 01/17/2020 0812   CL 101 01/17/2020 0812   CO2 28 01/17/2020 0812   GLUCOSE 86 01/17/2020 0812   GLUCOSE 91 01/25/2018 0553   BUN 16 01/17/2020 0812   CREATININE 0.96 01/17/2020 0812   CALCIUM 9.1 01/17/2020 0812   PROT 7.2 01/23/2018 1648   ALBUMIN 4.2 01/23/2018 1648   AST 39 01/23/2018 1648   ALT 14 01/23/2018 1648   ALKPHOS 85 01/23/2018 1648   BILITOT 0.8 01/23/2018 1648   GFRNONAA 64 01/17/2020 0812   GFRAA 74 01/17/2020 0812   Lab Results  Component Value Date   CHOL 115 01/24/2018   HDL 51 01/24/2018   LDLCALC 58 01/24/2018   TRIG 32 01/24/2018   CHOLHDL 2.3 01/24/2018   Lab Results  Component Value Date   HGBA1C 5.7 (H) 01/24/2018   No results found for: VITAMINB12 No results found for: TSH     ASSESSMENT AND PLAN 59 y.o. year old female  has a past medical history of Anxiety, Arthritis, Carpal tunnel syndrome, Cataract, CVA (cerebral vascular accident) ([place]), Depression, Gait abnormality, Hyperlipidemia, Hypertension, Migraine, Osteoarthritis, Smoker, and Stroke ([place]) (2019). here with     ICD-10-CM   1. Cervical stenosis of spinal canal  M48.02   2. OSA on CPAP  G47.33    Z99.89   3. Gait disturbance  R26.9   4. Dysesthesia  R20.8   5. Excessive daytime sleepiness  G47.19     Georgie is doing better today.  She reports that gabapentin has been very effective in helping to manage neck pain and dysesthesias.  We will continue 300 mg in the a.m., 300 mg at lunch and 600 mg at dinner.  She will continue to work with physical therapy for gait disturbance.  She was encouraged to use her cane at all times.  Compliance report reveals suboptimal compliance.  Fortunately, she does note improvement in sleep quality and daytime energy levels when using CPAP.   I have encouraged her to work on improving compliance.  We have reviewed risk of untreated sleep apnea.  I have also reviewed insurance requirements to meet a minimum of 70% compliance.  She will continue to work towards this goal.  Healthy lifestyle habits encouraged.  She will follow-up with [place] in 3 months, sooner if needed.  She verbalizes understanding and agreement with this plan.   No orders of the defined types  were placed in this encounter.    No orders of the defined types were placed in this encounter.     I spent 35 minutes with the patient. 50% of this time was spent counseling and educating patient on plan of care and medications.     Debbora Presto, FNP-C 02/07/2020, 2:25 PM [facility] [address], [place], [place] [address] [phone]

## 2020-02-28 ENCOUNTER — Telehealth (INDEPENDENT_AMBULATORY_CARE_PROVIDER_SITE_OTHER): Payer: Medicare HMO | Admitting: Cardiovascular Disease

## 2020-02-28 ENCOUNTER — Telehealth: Payer: Self-pay | Admitting: Physician Assistant

## 2020-02-28 ENCOUNTER — Telehealth: Payer: Self-pay | Admitting: *Deleted

## 2020-02-28 ENCOUNTER — Encounter: Payer: Self-pay | Admitting: Cardiovascular Disease

## 2020-02-28 NOTE — Telephone Encounter (Signed)
     Pt missed Dr. Victorino December call today so she r/s her appt with Almyra Deforest on Thursday. She would like to know what number Almyra Deforest is calling from so she can remove it for her call blocker

## 2020-02-28 NOTE — Progress Notes (Signed)
No show

## 2020-02-28 NOTE — Telephone Encounter (Signed)
Attempted to reach the patient times 3 with no success for her virtual appointment today with Dr. Sallyanne Kuster. Message was left to call back.

## 2020-03-01 ENCOUNTER — Encounter: Payer: Self-pay | Admitting: Physician Assistant

## 2020-03-01 ENCOUNTER — Telehealth: Payer: Self-pay

## 2020-03-01 ENCOUNTER — Telehealth (INDEPENDENT_AMBULATORY_CARE_PROVIDER_SITE_OTHER): Payer: Medicare HMO | Admitting: Physician Assistant

## 2020-03-01 VITALS — Ht 63.0 in | Wt 203.0 lb

## 2020-03-01 DIAGNOSIS — R079 Chest pain, unspecified: Secondary | ICD-10-CM | POA: Diagnosis not present

## 2020-03-01 DIAGNOSIS — I10 Essential (primary) hypertension: Secondary | ICD-10-CM | POA: Diagnosis not present

## 2020-03-01 DIAGNOSIS — R9389 Abnormal findings on diagnostic imaging of other specified body structures: Secondary | ICD-10-CM

## 2020-03-01 DIAGNOSIS — E785 Hyperlipidemia, unspecified: Secondary | ICD-10-CM | POA: Diagnosis not present

## 2020-03-01 NOTE — Telephone Encounter (Signed)
Called patient to discuss AVS instructions gave Wendy Leblanc's recommendations and patient voiced understanding. AVS summary mailed to patient.    

## 2020-03-01 NOTE — Telephone Encounter (Signed)
  Patient Consent for Virtual Visit         Wendy Leblanc has provided verbal consent on 03/01/2020 for a virtual visit (video or telephone).   CONSENT FOR VIRTUAL VISIT FOR:  Wendy Leblanc  By participating in this virtual visit I agree to the following:  I hereby voluntarily request, consent and authorize [facility] and its employed or contracted physicians, physician assistants, nurse practitioners or other licensed health care professionals (the Practitioner), to provide me with telemedicine health care services (the "Services") as deemed necessary by the treating Practitioner. I acknowledge and consent to receive the Services by the Practitioner via telemedicine. I understand that the telemedicine visit will involve communicating with the Practitioner through live audiovisual communication technology and the disclosure of certain medical information by electronic transmission. I acknowledge that I have been given the opportunity to request an in-person assessment or other available alternative prior to the telemedicine visit and am voluntarily participating in the telemedicine visit.  I understand that I have the right to withhold or withdraw my consent to the use of telemedicine in the course of my care at any time, without affecting my right to future care or treatment, and that the Practitioner or I may terminate the telemedicine visit at any time. I understand that I have the right to inspect all information obtained and/or recorded in the course of the telemedicine visit and may receive copies of available information for a reasonable fee.  I understand that some of the potential risks of receiving the Services via telemedicine include:  Marland Kitchen Delay or interruption in medical evaluation due to technological equipment failure or disruption; . Information transmitted may not be sufficient (e.g. poor resolution of images) to allow for appropriate medical decision making by the  Practitioner; and/or  . In rare instances, security protocols could fail, causing a breach of personal health information.  Furthermore, I acknowledge that it is my responsibility to provide information about my medical history, conditions and care that is complete and accurate to the best of my ability. I acknowledge that Practitioner's advice, recommendations, and/or decision may be based on factors not within their control, such as incomplete or inaccurate data provided by me or distortions of diagnostic images or specimens that may result from electronic transmissions. I understand that the practice of medicine is not an exact science and that Practitioner makes no warranties or guarantees regarding treatment outcomes. I acknowledge that a copy of this consent can be made available to me via my patient portal ([facility]), or I can request a printed copy by calling the office of [facility].    I understand that my insurance will be billed for this visit.   I have read or had this consent read to me. . I understand the contents of this consent, which adequately explains the benefits and risks of the Services being provided via telemedicine.  . I have been provided ample opportunity to ask questions regarding this consent and the Services and have had my questions answered to my satisfaction. . I give my informed consent for the services to be provided through the use of telemedicine in my medical care

## 2020-03-01 NOTE — Patient Instructions (Signed)
Medication Instructions:  Your physician recommends that you continue on your current medications as directed. Please refer to the Current Medication list given to you today.  *If you need a refill on your cardiac medications before your next appointment, please call your pharmacy*  Lab Work: NONE ordered at this time of appointment   If you have labs (blood work) drawn today and your tests are completely normal, you will receive your results only by:  [facility] (if you have MyChart) OR  A paper copy in the mail If you have any lab test that is abnormal or we need to change your treatment, we will call you to review the results.  Testing/Procedures: NONE ordered at this time of appointment   Follow-Up: At [facility], you and your health needs are our priority.  As part of our continuing mission to provide you with exceptional heart care, we have created designated Provider Care Teams.  These Care Teams include your primary Cardiologist (physician) and Advanced Practice Providers (APPs -  Physician Assistants and Nurse Practitioners) who all work together to provide you with the care you need, when you need it.  We recommend signing up for the patient portal called "MyChart".  Sign up information is provided on this After Visit Summary.  MyChart is used to connect with patients for Virtual Visits (Telemedicine).  Patients are able to view lab/test results, encounter notes, upcoming appointments, etc.  Non-urgent messages can be sent to your provider as well.   To learn more about what you can do with MyChart, go to [email or web address].    Your next appointment:   As Needed    The format for your next appointment:   In Person  Provider:   You may see Sanda Klein, MD or one of the following Advanced Practice Providers on your designated Care Team:    Almyra Deforest, PA-C  Fabian Sharp, [place] or   Roby Lofts, [place]   Other Instructions

## 2020-03-01 NOTE — Progress Notes (Signed)
Virtual Visit via Telephone Note   This visit type was conducted due to national recommendations for restrictions regarding the COVID-19 Pandemic (e.g. social distancing) in an effort to limit this patient's exposure and mitigate transmission in our community.  Due to her co-morbid illnesses, this patient is at least at moderate risk for complications without adequate follow up.  This format is felt to be most appropriate for this patient at this time.  The patient did not have access to video technology/had technical difficulties with video requiring transitioning to audio format only (telephone).  All issues noted in this document were discussed and addressed.  No physical exam could be performed with this format.  Please refer to the patient's chart for her  consent to telehealth for [facility].    Date:  03/03/2020   ID:  Ibtisam Benge, DOB May 26, 1960, MRN [id number] The patient was identified using 2 identifiers.  Patient Location: Home Provider Location: Office/Clinic  PCP:  Vassie Moment, MD  Cardiologist:  Sanda Klein, MD  Electrophysiologist:  None   Evaluation Performed:  Follow-Up Visit  Chief Complaint:  Follow up  History of Present Illness:    Daviana Haymaker is a 59 y.o. female with past medical history of hypertension, hyperlipidemia and a family history of heart disease.  In 2019, she had a loss of consciousness followed by transient left-sided weakness.  MRI was negative for CVA.  Carotid Doppler did not show significant obstruction.  Echocardiogram at the time showed EF 60 to 65%, no significant valve issue.  ABI obtained in 2021 showed reduced ABI on the right with 0.7, normal ABI on the left side.  Patient was evaluated by Dr. Sallyanne Kuster on 11/25/2019 with chest discomfort.  Coronary CT was obtained on 01/20/2020 which showed 0 calcium, no evidence of CAD.  She does have extensive groundglass attenuation in the basal segment of the left lower lobe, this  could represent atypical infection.  More definitive arterial Doppler obtained on 01/24/2020 did not show any significant arterial occlusive disease.  She was previously set up to follow-up with Dr. Sallyanne Kuster virtually on 10/26, this was later canceled and rescheduled for today.  Patient presents today for virtual visit.  She says she did have a Covid test which was negative.  She is seeing a pulmonologist early next month.  She still have the chest pain, however the chest pain is worse with deep inspiration, coughing and palpation, this is clearly musculoskeletal in nature.  She denies any significant lower extremity edema.  At this time, I do not recommend any further work-up.  She can follow-up with cardiology service on as-needed basis.  The patient does not have symptoms concerning for COVID-19 infection (fever, chills, cough, or new shortness of breath).    Past Medical History:  Diagnosis Date  . Anxiety   . Arthritis   . Carpal tunnel syndrome   . Cataract   . CVA (cerebral vascular accident) ([place])   . Depression   . Gait abnormality   . Hyperlipidemia   . Hypertension   . Migraine   . Osteoarthritis   . Smoker   . Stroke [facility]) 2019   Past Surgical History:  Procedure Laterality Date  . ABDOMINAL HYSTERECTOMY    . CARPAL TUNNEL RELEASE    . hemorrhoid surgery    . TUBAL LIGATION       Current Meds  Medication Sig  . acetaminophen (TYLENOL) 500 MG tablet Take 500 mg by mouth every 6 (six) hours  as needed.  Marland Kitchen amLODipine (NORVASC) 10 MG tablet Take 10 mg by mouth daily.  Marland Kitchen aspirin EC 81 MG tablet Take 81 mg by mouth daily.  Marland Kitchen atorvastatin (LIPITOR) 20 MG tablet Take 20 mg by mouth daily.  . Cholecalciferol (VITAMIN D3 PO) Take by mouth. 1000u daily  . cyclobenzaprine (FLEXERIL) 5 MG tablet Take 5 mg by mouth 3 (three) times daily as needed.  Marland Kitchen escitalopram (LEXAPRO) 10 MG tablet Take 10 mg by mouth daily.  Marland Kitchen gabapentin (NEURONTIN) 300 MG capsule One po q AM, one po q PM and  2 po qHS  . imipramine (TOFRANIL) 25 MG tablet Take 1 tablet (25 mg total) by mouth at bedtime.  Marland Kitchen lisinopril-hydrochlorothiazide (PRINZIDE,ZESTORETIC) 20-25 MG tablet Take 0.5 tablets by mouth daily.   Marland Kitchen omeprazole (PRILOSEC) 20 MG capsule Take 20 mg by mouth daily.  Marland Kitchen topiramate (TOPAMAX) 25 MG tablet Take 25 mg by mouth 2 (two) times daily.     Allergies:   Penicillins, Contrast media [iodinated diagnostic agents], and Sulfur   Social History   Tobacco Use  . Smoking status: Current Every Day Smoker    Packs/day: 0.50    Types: Cigarettes  . Smokeless tobacco: Never Used  Vaping Use  . Vaping Use: Former  Substance Use Topics  . Alcohol use: Not Currently  . Drug use: Never     Family Hx: The patient's family history includes Breast cancer in her mother and sister; Diabetes in her brother and sister; Heart disease in her mother. There is no history of Colon cancer, Esophageal cancer, Rectal cancer, Stomach cancer, or Colon polyps.  ROS:   Please see the history of present illness.     All other systems reviewed and are negative.   Prior CV studies:   The following studies were reviewed today:  Echo 01/24/2018 LV EF: 60% -  65%  Study Conclusions   - Left ventricle: The cavity size was normal. Wall thickness was  normal. Systolic function was normal. The estimated ejection  fraction was in the range of 60% to 65%. Left ventricular  diastolic function parameters were normal.    Labs/Other Tests and Data Reviewed:    EKG:  An ECG dated 11/25/2019 was personally reviewed today and demonstrated:  Sinus rhythm without significant ST-T wave changes.  Recent Labs: 01/17/2020: BUN 16; Creatinine, Ser 0.96; Potassium 3.7; Sodium 143   Recent Lipid Panel Lab Results  Component Value Date/Time   CHOL 115 01/24/2018 09:18 AM   TRIG 32 01/24/2018 09:18 AM   HDL 51 01/24/2018 09:18 AM   CHOLHDL 2.3 01/24/2018 09:18 AM   LDLCALC 58 01/24/2018 09:18 AM    Wt  Readings from Last 3 Encounters:  03/01/20 203 lb (92.1 kg)  02/28/20 203 lb (92.1 kg)  02/07/20 206 lb (93.4 kg)     Risk Assessment/Calculations:      Objective:    Vital Signs:  Ht 5\' 3"  (1.6 m)   Wt 203 lb (92.1 kg)   BMI 35.96 kg/m    VITAL SIGNS:  reviewed  ASSESSMENT & PLAN:    1. Abnormal CT of the chest: Upcoming pulmonology evaluation  2. Chest discomfort: Coronary CT was negative for coronary artery disease.  She can follow-up with cardiology service on a as needed basis.  3. Hypertension: Continue on current therapy  4. Hyperlipidemia: On Lipitor  COVID-19 Education: The signs and symptoms of COVID-19 were discussed with the patient and how to seek care for testing (follow up  with PCP or arrange E-visit).  The importance of social distancing was discussed today.  Time:   Today, I have spent 9 minutes with the patient with telehealth technology discussing the above problems.     Medication Adjustments/Labs and Tests Ordered: Current medicines are reviewed at length with the patient today.  Concerns regarding medicines are outlined above.   Tests Ordered: No orders of the defined types were placed in this encounter.   Medication Changes: No orders of the defined types were placed in this encounter.   Follow Up:   prn  Signed, Almyra Deforest, PA  03/03/2020 6:29 PM    [facility]

## 2020-03-13 ENCOUNTER — Other Ambulatory Visit: Payer: Self-pay

## 2020-03-13 ENCOUNTER — Encounter: Payer: Self-pay | Admitting: Pulmonary Disease

## 2020-03-13 ENCOUNTER — Ambulatory Visit (INDEPENDENT_AMBULATORY_CARE_PROVIDER_SITE_OTHER): Payer: Medicare HMO | Admitting: Pulmonary Disease

## 2020-03-13 VITALS — BP 118/76 | HR 82 | Temp 98.1°F | Ht 63.0 in | Wt 206.6 lb

## 2020-03-13 DIAGNOSIS — Z720 Tobacco use: Secondary | ICD-10-CM | POA: Diagnosis not present

## 2020-03-13 DIAGNOSIS — R9389 Abnormal findings on diagnostic imaging of other specified body structures: Secondary | ICD-10-CM

## 2020-03-13 DIAGNOSIS — J45909 Unspecified asthma, uncomplicated: Secondary | ICD-10-CM

## 2020-03-13 MED ORDER — FLUTICASONE-SALMETEROL 250-50 MCG/DOSE IN AEPB: 1.0000 | INHALATION_SPRAY | Freq: Two times a day (BID) | RESPIRATORY_TRACT | 6 refills | Status: DC

## 2020-03-13 MED ORDER — NICOTINE 7 MG/24HR TD PT24
7.0000 mg | MEDICATED_PATCH | Freq: Every day | TRANSDERMAL | 0 refills | Status: DC
Start: 2020-03-13 — End: 2021-09-11

## 2020-03-13 NOTE — Progress Notes (Signed)
Synopsis: Referred by Vassie Moment, MD for abnormal chest imaging  Subjective:   PATIENT ID: Wendy Leblanc GENDER: female DOB: 1961-01-07, MRN: [id number]   HPI  Chief Complaint  Patient presents with  . Consult    SOB with activity. She also has some chest pains. Follow up for spot on left lung    Lacosta Hargan is a 59 year old woman, daily smoker with history of hypertension, hyperlipidemia and CVA who is referred to pulmonary clinic for abnormal chest imaging.   She has been having increased chest tightness/discomfort and shortness of breath over the past 2 months. She was evaluated by cardiology and had a CT coronary scan done which was notable for left lower lobe ground glass opacities. She reports having increasing cough, shortness of breath and chills at that time. She was treated with prednisone and an antibiotic around the time of that scan which she reported improvement in her symptoms since.   She has an albuterol inhaler which she was recently prescribed which she uses 2-3 times per day and does find relief of her chest tightness and shortness of breath. The shortness of breath is worse at night which wakes her from sleep. She denies cough or wheezing at those times. Cold air and strong perfumes/colognes can trigger shortness of breath, cough and wheezing. She denies sinus congestion or drainage. She is taking omeprazole for GERD and does not report any issues with reflux symptoms.   She currently smokes 0.25 pack per day and had smoked 0.5 packs per day since the age of 89. She has slowed down on smoking due to the shortness of breath. She is interested in quitting smoking.  She was recently diagnosed with obstructive sleep apnea and is currently on auto-titrating CPAP 5-15cmH2O.  Per the neurology visit on 02/07/20: Compliance report dated 12/08/2019 through 02/05/2020 reveals that she used CPAP 25 of the past 60 days for compliance of 42%.  She is CPAP greater than 4 hours 9  of the past 60 days for compliance of 15%.  Average usage on days used was 3 hours and 31 minutes.  Residual AHI was 2.6 on 5 to 15 cm of water and an EPR of 3.  There was no significant leak noted.  There is significant history for asthma on both sides of her family.   Past Medical History:  Diagnosis Date  . Anxiety   . Arthritis   . Carpal tunnel syndrome   . Cataract   . CVA (cerebral vascular accident) ([place])   . Depression   . Gait abnormality   . Hyperlipidemia   . Hypertension   . Migraine   . Osteoarthritis   . Smoker   . Stroke [facility]) 2019     Family History  Problem Relation Age of Onset  . Breast cancer Mother   . Heart disease Mother   . Breast cancer Sister   . Diabetes Brother   . Diabetes Sister   . Colon cancer Neg Hx   . Esophageal cancer Neg Hx   . Rectal cancer Neg Hx   . Stomach cancer Neg Hx   . Colon polyps Neg Hx      Social History   Socioeconomic History  . Marital status: Widowed    Spouse name: Not on file  . Number of children: 2  . Years of education: Not on file  . Highest education level: 12th grade  Occupational History  . Not on file  Tobacco Use  .  Smoking status: Current Every Day Smoker    Packs/day: 0.50    Types: Cigarettes  . Smokeless tobacco: Never Used  Vaping Use  . Vaping Use: Former  Substance and Sexual Activity  . Alcohol use: Not Currently  . Drug use: Never  . Sexual activity: Not on file  Other Topics Concern  . Not on file  Social History Narrative  . Not on file   Social Determinants of Health   Financial Resource Strain:   . Difficulty of Paying Living Expenses: Not on file  Food Insecurity:   . Worried About Charity fundraiser in the Last Year: Not on file  . Ran Out of Food in the Last Year: Not on file  Transportation Needs:   . Lack of Transportation (Medical): Not on file  . Lack of Transportation (Non-Medical): Not on file  Physical Activity:   . Days of Exercise per Week: Not on file   . Minutes of Exercise per Session: Not on file  Stress:   . Feeling of Stress : Not on file  Social Connections:   . Frequency of Communication with Friends and Family: Not on file  . Frequency of Social Gatherings with Friends and Family: Not on file  . Attends Religious Services: Not on file  . Active Member of Clubs or Organizations: Not on file  . Attends Archivist Meetings: Not on file  . Marital Status: Not on file  Intimate Partner Violence:   . Fear of Current or Ex-Partner: Not on file  . Emotionally Abused: Not on file  . Physically Abused: Not on file  . Sexually Abused: Not on file     Allergies  Allergen Reactions  . Penicillins     ANAPHYLAXIS   . Contrast Media [Iodinated Diagnostic Agents]     hives  . Sulfur     HIVES      Outpatient Medications Prior to Visit  Medication Sig Dispense Refill  . acetaminophen (TYLENOL) 500 MG tablet Take 500 mg by mouth every 6 (six) hours as needed.    Marland Kitchen albuterol (VENTOLIN HFA) 108 (90 Base) MCG/ACT inhaler     . amLODipine (NORVASC) 10 MG tablet Take 10 mg by mouth daily.  0  . aspirin EC 81 MG tablet Take 81 mg by mouth daily.    Marland Kitchen atorvastatin (LIPITOR) 20 MG tablet Take 20 mg by mouth daily.    . Cholecalciferol (VITAMIN D3 PO) Take by mouth. 1000u daily    . cyclobenzaprine (FLEXERIL) 5 MG tablet Take 5 mg by mouth 3 (three) times daily as needed.    Marland Kitchen escitalopram (LEXAPRO) 10 MG tablet Take 10 mg by mouth daily.    Marland Kitchen gabapentin (NEURONTIN) 300 MG capsule One po q AM, one po q PM and 2 po qHS 120 capsule 5  . imipramine (TOFRANIL) 25 MG tablet Take 1 tablet (25 mg total) by mouth at bedtime. 30 tablet 5  . lidocaine (LIDODERM) 5 %     . lisinopril-hydrochlorothiazide (PRINZIDE,ZESTORETIC) 20-25 MG tablet Take 0.5 tablets by mouth daily.   0  . omeprazole (PRILOSEC) 20 MG capsule Take 20 mg by mouth daily.    Marland Kitchen topiramate (TOPAMAX) 25 MG tablet Take 25 mg by mouth 2 (two) times daily.  0   No  facility-administered medications prior to visit.    Review of Systems  Constitutional: Negative for chills, diaphoresis, fever, malaise/fatigue and weight loss.  HENT: Negative for congestion, sinus pain and sore throat.  Eyes: Negative.   Respiratory: Positive for cough, shortness of breath and wheezing. Negative for hemoptysis and sputum production.   Cardiovascular: Positive for chest pain. Negative for orthopnea, claudication and leg swelling.  Gastrointestinal: Negative for abdominal pain, diarrhea, heartburn, nausea and vomiting.  Genitourinary: Negative.   Musculoskeletal: Negative for joint pain and myalgias.  Neurological: Negative for dizziness, weakness and headaches.  Endo/Heme/Allergies: Does not bruise/bleed easily.    Objective:   Vitals:   03/13/20 1039  BP: 118/76  Pulse: 82  Temp: 98.1 F (36.7 C)  TempSrc: Oral  SpO2: 100%  Weight: 206 lb 9.6 oz (93.7 kg)  Height: 5\' 3"  (1.6 m)     Physical Exam Constitutional:      General: She is not in acute distress.    Appearance: She is obese.  HENT:     Head: Normocephalic and atraumatic.     Nose: Nose normal.     Mouth/Throat:     Mouth: Mucous membranes are moist.     Pharynx: Oropharynx is clear.  Eyes:     General: No scleral icterus.    Extraocular Movements: Extraocular movements intact.     Conjunctiva/sclera: Conjunctivae normal.     Pupils: Pupils are equal, round, and reactive to light.  Cardiovascular:     Rate and Rhythm: Normal rate and regular rhythm.     Pulses: Normal pulses.     Heart sounds: Normal heart sounds. No murmur heard.   Pulmonary:     Breath sounds: No wheezing, rhonchi or rales.  Abdominal:     General: Bowel sounds are normal.     Palpations: Abdomen is soft.  Musculoskeletal:     Cervical back: Neck supple. Tenderness present.     Right lower leg: No edema.     Left lower leg: No edema.  Lymphadenopathy:     Cervical: No cervical adenopathy.  Skin:    General:  Skin is warm and dry.  Neurological:     General: No focal deficit present.     Mental Status: She is alert and oriented to person, place, and time. Mental status is at baseline.     CBC    Component Value Date/Time   WBC 5.2 01/25/2018 0553   RBC 4.17 01/25/2018 0553   HGB 11.8 (L) 01/25/2018 0553   HCT 37.9 01/25/2018 0553   PLT 185 01/25/2018 0553   MCV 90.9 01/25/2018 0553   MCH 28.3 01/25/2018 0553   MCHC 31.1 01/25/2018 0553   RDW 12.8 01/25/2018 0553   LYMPHSABS 2.2 01/23/2018 1648   LYMPHSABS 2.2 01/23/2018 1648   MONOABS 0.4 01/23/2018 1648   MONOABS 0.5 01/23/2018 1648   EOSABS 0.1 01/23/2018 1648   EOSABS 0.1 01/23/2018 1648   BASOSABS 0.0 01/23/2018 1648   BASOSABS 0.0 01/23/2018 1648   Chest imaging: 01/20/20 CT Coronary Ground-glass attenuation in the basal segments of the left lower lobe. Some septal thickening in the periphery of the right lower lobe. Within the visualized portions of the thorax there are no suspicious appearing pulmonary nodules or masses, there are no pleural effusions, no pneumothorax and no lymphadenopathy. Visualized portions of the upper abdomen are unremarkable. There are no aggressive appearing lytic or blastic lesions noted in the visualized portions of the skeleton.  CXR 09/09/2019 The heart size and mediastinal contours are within normal limits. Both lungs are clear. No pneumothorax or pleural effusion is noted. The visualized skeletal structures are unremarkable.  PFT: No flowsheet data found.  Labs:  Echo:  01/24/2018 - Left ventricle: The cavity size was normal. Wall thickness was  normal. Systolic function was normal. The estimated ejection  fraction was in the range of 60% to 65%. Left ventricular  diastolic function parameters were normal.     Assessment & Plan:   Uncomplicated asthma, unspecified asthma severity, unspecified whether persistent - Plan: Pulmonary Function Test, Fluticasone-Salmeterol (WIXELA  INHUB) 250-50 MCG/DOSE AEPB  Abnormal chest CT - Plan: CT Chest Wo Contrast  Tobacco use  Discussion: Wendy Leblanc is a 59 year old woman, daily smoker with history of hypertension, hyperlipidemia and CVA who is referred to pulmonary clinic for abnormal chest imaging.   CT Coronary scan on 01/20/20 showed ground glass opacities within the left lower lobe concerning for acute infectious process which correlates to her clinical symptoms of cough, shortness of breath and chills at that time. We will repeat a dedicated chest CT without contrast to monitor these findings.   Her shortness of breath, chest tightness, cough and wheezing are concerning for asthma. We will start her on wixella inhub 250-78mcg 2 puffs twice daily along with as needed albuterol inhaler.   We discussed smoking cessation modalities. Given her current medications, we will hold off on chantix and wellbutrin at this time and try nicotine replacement therapy. I have prescribed 7mg  nicotine patches daily and she can use nicotine gum or lozenges for breakthrough cravings.   She is to follow up in 3 months with pulmonary function tests.  Freda Jackson, MD [facility] Office: [phone]   See Amion for Pager Details    Current Outpatient Medications:  .  acetaminophen (TYLENOL) 500 MG tablet, Take 500 mg by mouth every 6 (six) hours as needed., Disp: , Rfl:  .  albuterol (VENTOLIN HFA) 108 (90 Base) MCG/ACT inhaler, , Disp: , Rfl:  .  amLODipine (NORVASC) 10 MG tablet, Take 10 mg by mouth daily., Disp: , Rfl: 0 .  aspirin EC 81 MG tablet, Take 81 mg by mouth daily., Disp: , Rfl:  .  atorvastatin (LIPITOR) 20 MG tablet, Take 20 mg by mouth daily., Disp: , Rfl:  .  Cholecalciferol (VITAMIN D3 PO), Take by mouth. 1000u daily, Disp: , Rfl:  .  cyclobenzaprine (FLEXERIL) 5 MG tablet, Take 5 mg by mouth 3 (three) times daily as needed., Disp: , Rfl:  .  escitalopram (LEXAPRO) 10 MG tablet, Take 10 mg by  mouth daily., Disp: , Rfl:  .  gabapentin (NEURONTIN) 300 MG capsule, One po q AM, one po q PM and 2 po qHS, Disp: 120 capsule, Rfl: 5 .  imipramine (TOFRANIL) 25 MG tablet, Take 1 tablet (25 mg total) by mouth at bedtime., Disp: 30 tablet, Rfl: 5 .  lidocaine (LIDODERM) 5 %, , Disp: , Rfl:  .  lisinopril-hydrochlorothiazide (PRINZIDE,ZESTORETIC) 20-25 MG tablet, Take 0.5 tablets by mouth daily. , Disp: , Rfl: 0 .  omeprazole (PRILOSEC) 20 MG capsule, Take 20 mg by mouth daily., Disp: , Rfl:  .  topiramate (TOPAMAX) 25 MG tablet, Take 25 mg by mouth 2 (two) times daily., Disp: , Rfl: 0 .  Fluticasone-Salmeterol (WIXELA INHUB) 250-50 MCG/DOSE AEPB, Inhale 1 puff into the lungs in the morning and at bedtime., Disp: 60 each, Rfl: 6 .  nicotine (NICODERM CQ) 7 mg/24hr patch, Place 1 patch (7 mg total) onto the skin daily., Disp: 28 patch, Rfl: 0

## 2020-03-13 NOTE — Patient Instructions (Signed)
Start wixella inhaler 1 puff twice daily (rinse mouth out after use) If the wixella inhaler is too expensive, please give [place] a call and will find an inhaler that is covered by your insurance  We will schedule you for pulmonary function tests and follow up visit in 3 months

## 2020-03-30 ENCOUNTER — Telehealth: Payer: Self-pay | Admitting: Pulmonary Disease

## 2020-04-02 NOTE — Telephone Encounter (Addendum)
CT has been rescheduled to 12/15 at 3:30.  Gave appt info to pt.  Nothing further needed

## 2020-04-03 ENCOUNTER — Ambulatory Visit (HOSPITAL_COMMUNITY): Payer: Medicare HMO

## 2020-04-11 ENCOUNTER — Other Ambulatory Visit: Payer: Self-pay

## 2020-04-11 ENCOUNTER — Emergency Department (HOSPITAL_BASED_OUTPATIENT_CLINIC_OR_DEPARTMENT_OTHER)
Admission: EM | Admit: 2020-04-11 | Discharge: 2020-04-11 | Disposition: A | Discharge status: Home / Self Care | Payer: Medicare HMO | Attending: Emergency Medicine | Admitting: Emergency Medicine

## 2020-04-11 DIAGNOSIS — I10 Essential (primary) hypertension: Secondary | ICD-10-CM | POA: Insufficient documentation

## 2020-04-11 DIAGNOSIS — Z79899 Other long term (current) drug therapy: Secondary | ICD-10-CM | POA: Diagnosis not present

## 2020-04-11 DIAGNOSIS — F17210 Nicotine dependence, cigarettes, uncomplicated: Secondary | ICD-10-CM | POA: Insufficient documentation

## 2020-04-11 DIAGNOSIS — L509 Urticaria, unspecified: Secondary | ICD-10-CM | POA: Diagnosis not present

## 2020-04-11 DIAGNOSIS — Z7982 Long term (current) use of aspirin: Secondary | ICD-10-CM | POA: Insufficient documentation

## 2020-04-11 DIAGNOSIS — Z8673 Personal history of transient ischemic attack (TIA), and cerebral infarction without residual deficits: Secondary | ICD-10-CM | POA: Insufficient documentation

## 2020-04-11 DIAGNOSIS — R21 Rash and other nonspecific skin eruption: Secondary | ICD-10-CM | POA: Diagnosis present

## 2020-04-11 MED ORDER — LORATADINE 10 MG PO TABS
10.0000 mg | ORAL_TABLET | Freq: Every day | ORAL | 0 refills | Status: DC | PRN
Start: 2020-04-11 — End: 2021-09-16

## 2020-04-11 MED ORDER — EPINEPHRINE 0.3 MG/0.3ML IJ SOAJ: 0.3000 mg | INTRAMUSCULAR | 0 refills | Status: AC | PRN

## 2020-04-11 MED ORDER — LORATADINE 10 MG PO TABS
10.0000 mg | ORAL_TABLET | Freq: Once | ORAL | Status: AC
  Administered 2020-04-11: 10 mg via ORAL
  Filled 2020-04-11: qty 1

## 2020-04-11 NOTE — ED Triage Notes (Signed)
Pt arrives complaining of "welts" all over body starting last night. Denies new food/medication/detergent. States rash itches. Denies trouble breathing/swallowing. Took benadryl, states didn't help.

## 2020-04-11 NOTE — ED Provider Notes (Signed)
[facility] EMERGENCY DEPARTMENT Provider Note   CSN: [id number] Arrival date & time: 04/11/20  1031     History Chief Complaint  Patient presents with  . Urticaria    Wendy Leblanc is a 59 y.o. female.  Presents with chief complaint of itchy rash.  She states it started yesterday night.  She does not recall what may have caused it.  Denies any new foods new soaps new lotions new clothes or new detergents.  She had similar episode several years ago but feels like it was not as severe as this time.  Denies any difficulty breathing or tightening around the neck or airway.  Presents to the ER she took Benadryl and she feels like it has not really worked well.  Denies other illnesses such as fever vomiting cough diarrhea chest pain headache or abdominal pain.        Past Medical History:  Diagnosis Date  . Anxiety   . Arthritis   . Carpal tunnel syndrome   . Cataract   . CVA (cerebral vascular accident) ([place])   . Depression   . Gait abnormality   . Hyperlipidemia   . Hypertension   . Migraine   . Osteoarthritis   . Smoker   . Stroke [facility]) 2019    Patient Active Problem List   Diagnosis Date Noted  . Cervical stenosis of spinal canal 12/06/2019  . Arm weakness 12/06/2019  . Carpal tunnel syndrome, bilateral 12/06/2019  . Dysesthesia 12/06/2019  . Neck pain 12/06/2019  . OSA (obstructive sleep apnea) 12/06/2019  . Hypercholesterolemia 11/27/2019  . Severe obesity (BMI 35.0-39.9) with comorbidity ([place]) 11/27/2019  . Smoking 11/27/2019  . Intermittent claudication ([place]) 11/27/2019  . TIA (transient ischemic attack) 01/24/2018  . Essential hypertension 01/24/2018  . Depression 01/24/2018    Past Surgical History:  Procedure Laterality Date  . ABDOMINAL HYSTERECTOMY    . CARPAL TUNNEL RELEASE    . hemorrhoid surgery    . TUBAL LIGATION       OB History    Gravida  3   Para      Term      Preterm      AB  1   Living  2     SAB  1    TAB      Ectopic      Multiple      Live Births  2           Family History  Problem Relation Age of Onset  . Breast cancer Mother   . Heart disease Mother   . Breast cancer Sister   . Diabetes Brother   . Diabetes Sister   . Colon cancer Neg Hx   . Esophageal cancer Neg Hx   . Rectal cancer Neg Hx   . Stomach cancer Neg Hx   . Colon polyps Neg Hx     Social History   Tobacco Use  . Smoking status: Current Every Day Smoker    Packs/day: 0.50    Types: Cigarettes  . Smokeless tobacco: Never Used  Vaping Use  . Vaping Use: Former  Substance Use Topics  . Alcohol use: Not Currently  . Drug use: Never    Home Medications Prior to Admission medications   Medication Sig Start Date End Date Taking? Authorizing Provider  acetaminophen (TYLENOL) 500 MG tablet Take 500 mg by mouth every 6 (six) hours as needed.    [provider]  albuterol (VENTOLIN HFA) 108 (  90 Base) MCG/ACT inhaler  02/05/20   [provider]  amLODipine (NORVASC) 10 MG tablet Take 10 mg by mouth daily. 12/29/17   [provider]  aspirin EC 81 MG tablet Take 81 mg by mouth daily.    [provider]  atorvastatin (LIPITOR) 20 MG tablet Take 20 mg by mouth daily.    [provider]  Cholecalciferol (VITAMIN D3 PO) Take by mouth. 1000u daily    [provider]  cyclobenzaprine (FLEXERIL) 5 MG tablet Take 5 mg by mouth 3 (three) times daily as needed. 04/03/19   [provider]  escitalopram (LEXAPRO) 10 MG tablet Take 10 mg by mouth daily.    [provider]  Fluticasone-Salmeterol (WIXELA INHUB) 250-50 MCG/DOSE AEPB Inhale 1 puff into the lungs in the morning and at bedtime. 03/13/20   Freddi Starr, MD  gabapentin (NEURONTIN) 300 MG capsule One po q AM, one po q PM and 2 po qHS 12/06/19   Sater, Nanine Means, MD  imipramine (TOFRANIL) 25 MG tablet Take 1 tablet (25 mg total) by mouth at bedtime. 09/30/19   Sater, Nanine Means, MD   lidocaine (LIDODERM) 5 %  02/13/20   [provider]  lisinopril-hydrochlorothiazide (PRINZIDE,ZESTORETIC) 20-25 MG tablet Take 0.5 tablets by mouth daily.  12/29/17   [provider]  loratadine (CLARITIN) 10 MG tablet Take 1 tablet (10 mg total) by mouth daily as needed for allergies or itching (rash). 04/11/20   Luna Fuse, MD  nicotine (NICODERM CQ) 7 mg/24hr patch Place 1 patch (7 mg total) onto the skin daily. 03/13/20   Freddi Starr, MD  omeprazole (PRILOSEC) 20 MG capsule Take 20 mg by mouth daily.    [provider]  topiramate (TOPAMAX) 25 MG tablet Take 25 mg by mouth 2 (two) times daily. 12/29/17   [provider]    Allergies    Penicillins, Contrast media [iodinated diagnostic agents], and Sulfur  Review of Systems   Review of Systems  Constitutional: Negative for fever.  HENT: Negative for ear pain.   Eyes: Negative for pain.  Respiratory: Negative for cough.   Cardiovascular: Negative for chest pain.  Gastrointestinal: Negative for abdominal pain.  Genitourinary: Negative for flank pain.  Musculoskeletal: Negative for back pain.  Skin: Positive for rash.  Neurological: Negative for headaches.    Physical Exam Updated Vital Signs BP 122/80 (BP Location: Left Arm)   Pulse 100   Temp 98.6 F (37 C) (Oral)   Resp 18   Ht 5\' 3"  (1.6 m)   Wt 93.4 kg   SpO2 99%   BMI 36.49 kg/m   Physical Exam Constitutional:      General: She is not in acute distress.    Appearance: Normal appearance.  HENT:     Head: Normocephalic.     Nose: Nose normal.  Eyes:     Extraocular Movements: Extraocular movements intact.  Cardiovascular:     Rate and Rhythm: Normal rate.  Pulmonary:     Effort: Pulmonary effort is normal.  Abdominal:     Palpations: Abdomen is soft.     Tenderness: There is no abdominal tenderness.  Musculoskeletal:        General: Normal range of motion.     Cervical back: Normal range of motion.  Skin:     General: Skin is warm.     Comments: Scant urticarial rash seen along upper chest region and lower abdomen.  No abnormal warmth or cellulitis noted.  Neurological:     General: No focal deficit present.     Mental Status: She is alert.     ED Results / Procedures / Treatments   Labs (all labs ordered are listed, but only abnormal results are displayed) Labs Reviewed - No data to display  EKG None  Radiology No results found.  Procedures Procedures (including critical care time)  Medications Ordered in ED Medications  loratadine (CLARITIN) tablet 10 mg (has no administration in time range)    ED Course  I have reviewed the triage vital signs and the nursing notes.  Pertinent labs & imaging results that were available during my care of the patient were reviewed by me and considered in my medical decision making (see chart for details).    MDM Rules/Calculators/A&P                          Presents with urticarial rash.  Cause unclear.  Airway appears intact.  No evidence of cellulitis or secondary infection noted.  Vital signs otherwise stable.  Upon evaluation with the patient, she states the rash does appear improved and is mostly gone.  Given Claritin for symptomatic treatment.  Advised follow-up with allergy specialist within 1 to 2 weeks.  Advised immediate return for difficulty breathing worsening symptoms or any additional concerns.  Risk and benefit of steroid course described with patient, elected not to pursue this at this time given mild symptoms. Final Clinical Impression(s) / ED Diagnoses Final diagnoses:  Urticarial rash    Rx / DC Orders ED Discharge Orders         Ordered    loratadine (CLARITIN) 10 MG tablet  Daily PRN        04/11/20 1156           Luna Fuse, MD 04/11/20 1157

## 2020-04-11 NOTE — Discharge Instructions (Addendum)
Call your primary care doctor or specialist as discussed in the next 2-3 days.   Return immediately back to the ER if:  Your symptoms worsen within the next 12-24 hours. You develop new symptoms such as new fevers, persistent vomiting, new pain, shortness of breath, or new weakness or numbness, or if you have any other concerns.  

## 2020-04-13 ENCOUNTER — Other Ambulatory Visit: Payer: Self-pay

## 2020-04-13 ENCOUNTER — Emergency Department (HOSPITAL_BASED_OUTPATIENT_CLINIC_OR_DEPARTMENT_OTHER)
Admission: EM | Admit: 2020-04-13 | Discharge: 2020-04-13 | Disposition: A | Discharge status: Home / Self Care | Payer: Medicare HMO | Attending: Emergency Medicine | Admitting: Emergency Medicine

## 2020-04-13 ENCOUNTER — Encounter (HOSPITAL_BASED_OUTPATIENT_CLINIC_OR_DEPARTMENT_OTHER): Payer: Self-pay | Admitting: Emergency Medicine

## 2020-04-13 DIAGNOSIS — Z7982 Long term (current) use of aspirin: Secondary | ICD-10-CM | POA: Insufficient documentation

## 2020-04-13 DIAGNOSIS — R21 Rash and other nonspecific skin eruption: Secondary | ICD-10-CM | POA: Diagnosis present

## 2020-04-13 DIAGNOSIS — Z79899 Other long term (current) drug therapy: Secondary | ICD-10-CM | POA: Diagnosis not present

## 2020-04-13 DIAGNOSIS — I10 Essential (primary) hypertension: Secondary | ICD-10-CM | POA: Insufficient documentation

## 2020-04-13 DIAGNOSIS — F17210 Nicotine dependence, cigarettes, uncomplicated: Secondary | ICD-10-CM | POA: Diagnosis not present

## 2020-04-13 DIAGNOSIS — L509 Urticaria, unspecified: Secondary | ICD-10-CM | POA: Diagnosis not present

## 2020-04-13 DIAGNOSIS — T7840XA Allergy, unspecified, initial encounter: Secondary | ICD-10-CM | POA: Insufficient documentation

## 2020-04-13 MED ORDER — FAMOTIDINE 20 MG PO TABS
20.0000 mg | ORAL_TABLET | Freq: Once | ORAL | Status: AC
  Administered 2020-04-13: 20 mg via ORAL
  Filled 2020-04-13: qty 1

## 2020-04-13 MED ORDER — PREDNISONE 50 MG PO TABS
60.0000 mg | ORAL_TABLET | Freq: Once | ORAL | Status: AC
  Administered 2020-04-13: 60 mg via ORAL
  Filled 2020-04-13: qty 1

## 2020-04-13 MED ORDER — PREDNISONE 50 MG PO TABS: 50.0000 mg | ORAL_TABLET | Freq: Every day | ORAL | 0 refills | Status: AC

## 2020-04-13 NOTE — ED Notes (Signed)
Pt given cab voucher.  

## 2020-04-13 NOTE — Discharge Instructions (Signed)
Please follow up with your PCP as scheduled on 12/16 Pick up the prednisone and take as prescribed for the next 4 days. Continue taking Benadryl daily for your rash.   You were prescribed an epi pen during your last visit - please pick this up so you can have incase of any worsening symptoms. If you begin to have any throat swelling, difficulty breathing, wheezing use the epi pen IMMEDIATELY.   Return to the ED for any worsening symptoms

## 2020-04-13 NOTE — ED Provider Notes (Signed)
[facility] EMERGENCY DEPARTMENT Provider Note   CSN: [id number] Arrival date & time: 04/13/20  1837     History Chief Complaint  Patient presents with  . Allergic Reaction    Wendy Leblanc is a 59 y.o. female with PMHx HTN, HLD, CVA who presents to the ED today via EMS for allergic reaction. She was seen in the ED 2 days ago for urticarial rash. She was treated with A[place] while she was here and advised to follow-up with allergy specialist within 1 to 2 weeks. She is also discharged home with an EpiPen. Patient states that the rash seemed to get worse yesterday without relief today. She states that she stopped taking the Claritin and went to the pharmacy and was told to take liquid Benadryl as it would get into her "bloodstream." Patient has not picked up her EpiPen. She is scheduled to see her PCP on the 16th with plans to be referred to a specialist. Patient denies any new meds, foods, soaps, detergents, other hygiene products. Her daughter lives with her and is not having any symptoms. She denies any difficulty breathing/throat swelling/abdominal pain, nausea, vomiting, any other additional symptoms.   The history is provided by the patient and medical records.       Past Medical History:  Diagnosis Date  . Anxiety   . Arthritis   . Carpal tunnel syndrome   . Cataract   . CVA (cerebral vascular accident) ([place])   . Depression   . Gait abnormality   . Hyperlipidemia   . Hypertension   . Migraine   . Osteoarthritis   . Smoker   . Stroke [facility]) 2019    Patient Active Problem List   Diagnosis Date Noted  . Cervical stenosis of spinal canal 12/06/2019  . Arm weakness 12/06/2019  . Carpal tunnel syndrome, bilateral 12/06/2019  . Dysesthesia 12/06/2019  . Neck pain 12/06/2019  . OSA (obstructive sleep apnea) 12/06/2019  . Hypercholesterolemia 11/27/2019  . Severe obesity (BMI 35.0-39.9) with comorbidity ([place]) 11/27/2019  . Smoking 11/27/2019  .  Intermittent claudication ([place]) 11/27/2019  . TIA (transient ischemic attack) 01/24/2018  . Essential hypertension 01/24/2018  . Depression 01/24/2018    Past Surgical History:  Procedure Laterality Date  . ABDOMINAL HYSTERECTOMY    . CARPAL TUNNEL RELEASE    . hemorrhoid surgery    . TUBAL LIGATION       OB History    Gravida  3   Para      Term      Preterm      AB  1   Living  2     SAB  1   IAB      Ectopic      Multiple      Live Births  2           Family History  Problem Relation Age of Onset  . Breast cancer Mother   . Heart disease Mother   . Breast cancer Sister   . Diabetes Brother   . Diabetes Sister   . Colon cancer Neg Hx   . Esophageal cancer Neg Hx   . Rectal cancer Neg Hx   . Stomach cancer Neg Hx   . Colon polyps Neg Hx     Social History   Tobacco Use  . Smoking status: Current Every Day Smoker    Packs/day: 0.50    Types: Cigarettes  . Smokeless tobacco: Never Used  Vaping Use  . Vaping  Use: Former  Substance Use Topics  . Alcohol use: Not Currently  . Drug use: Never    Home Medications Prior to Admission medications   Medication Sig Start Date End Date Taking? Authorizing Provider  acetaminophen (TYLENOL) 500 MG tablet Take 500 mg by mouth every 6 (six) hours as needed.    [provider]  albuterol (VENTOLIN HFA) 108 (90 Base) MCG/ACT inhaler  02/05/20   [provider]  amLODipine (NORVASC) 10 MG tablet Take 10 mg by mouth daily. 12/29/17   [provider]  aspirin EC 81 MG tablet Take 81 mg by mouth daily.    [provider]  atorvastatin (LIPITOR) 20 MG tablet Take 20 mg by mouth daily.    [provider]  Cholecalciferol (VITAMIN D3 PO) Take by mouth. 1000u daily    [provider]  cyclobenzaprine (FLEXERIL) 5 MG tablet Take 5 mg by mouth 3 (three) times daily as needed. 04/03/19   [provider]  EPINEPHrine 0.3 mg/0.3 mL IJ SOAJ injection Inject  0.3 mg into the muscle as needed for anaphylaxis. 04/11/20   Luna Fuse, MD  escitalopram (LEXAPRO) 10 MG tablet Take 10 mg by mouth daily.    [provider]  Fluticasone-Salmeterol (WIXELA INHUB) 250-50 MCG/DOSE AEPB Inhale 1 puff into the lungs in the morning and at bedtime. 03/13/20   Freddi Starr, MD  gabapentin (NEURONTIN) 300 MG capsule One po q AM, one po q PM and 2 po qHS 12/06/19   Sater, Nanine Means, MD  imipramine (TOFRANIL) 25 MG tablet Take 1 tablet (25 mg total) by mouth at bedtime. 09/30/19   Sater, Nanine Means, MD  lidocaine (LIDODERM) 5 %  02/13/20   [provider]  lisinopril-hydrochlorothiazide (PRINZIDE,ZESTORETIC) 20-25 MG tablet Take 0.5 tablets by mouth daily.  12/29/17   [provider]  loratadine (CLARITIN) 10 MG tablet Take 1 tablet (10 mg total) by mouth daily as needed for allergies or itching (rash). 04/11/20   Luna Fuse, MD  nicotine (NICODERM CQ) 7 mg/24hr patch Place 1 patch (7 mg total) onto the skin daily. 03/13/20   Freddi Starr, MD  omeprazole (PRILOSEC) 20 MG capsule Take 20 mg by mouth daily.    [provider]  predniSONE (DELTASONE) 50 MG tablet Take 1 tablet (50 mg total) by mouth daily for 4 days. 04/13/20 04/17/20  Eustaquio Maize, PA-C  topiramate (TOPAMAX) 25 MG tablet Take 25 mg by mouth 2 (two) times daily. 12/29/17   [provider]    Allergies    Penicillins, Contrast media [iodinated diagnostic agents], and Sulfur  Review of Systems   Review of Systems  Constitutional: Negative for chills and fever.  HENT: Negative for trouble swallowing.   Respiratory: Negative for shortness of breath.   Skin: Positive for rash.  All other systems reviewed and are negative.   Physical Exam Updated Vital Signs BP 109/65 (BP Location: Right Arm)   Pulse (!) 119   Temp 98.5 F (36.9 C) (Oral)   Resp 20   Ht 5\' 3"  (1.6 m)   Wt 93.4 kg   SpO2 99%   BMI 36.48 kg/m   Physical Exam Vitals and  nursing note reviewed.  Constitutional:      Appearance: She is not ill-appearing or diaphoretic.  HENT:     Head: Normocephalic and atraumatic.     Mouth/Throat:     Mouth: Mucous membranes are moist.     Pharynx: No oropharyngeal exudate  or posterior oropharyngeal erythema.     Comments: Uvula is midline. Airway is patent. No oral lesions.  Eyes:     Conjunctiva/sclera: Conjunctivae normal.  Cardiovascular:     Rate and Rhythm: Normal rate and regular rhythm.  Pulmonary:     Effort: Pulmonary effort is normal.     Breath sounds: Normal breath sounds. No wheezing, rhonchi or rales.  Abdominal:     Palpations: Abdomen is soft.     Tenderness: There is no abdominal tenderness.  Musculoskeletal:     Cervical back: Neck supple.  Skin:    General: Skin is warm and dry.     Findings: Rash present.  Neurological:     Mental Status: She is alert.     ED Results / Procedures / Treatments   Labs (all labs ordered are listed, but only abnormal results are displayed) Labs Reviewed - No data to display  EKG EKG Interpretation  Date/Time:  Friday April 13 2020 19:19:06 EST Ventricular Rate:  112 PR Interval:    QRS Duration: 80 QT Interval:  368 QTC Calculation: 503 R Axis:   56 Text Interpretation: Sinus tachycardia Abnormal R-wave progression, early transition Borderline repolarization abnormality Borderline prolonged QT interval Baseline wander in lead(s) V6 Confirmed by Madalyn Rob [id number]) on 04/13/2020 7:51:53 PM   Radiology No results found.  Procedures Procedures (including critical care time)  Medications Ordered in ED Medications  famotidine (PEPCID) tablet 20 mg (20 mg Oral Given 04/13/20 1922)  predniSONE (DELTASONE) tablet 60 mg (60 mg Oral Given 04/13/20 1922)    ED Course  I have reviewed the triage vital signs and the nursing notes.  Pertinent labs & imaging results that were available during my care of the patient were reviewed by me and  considered in my medical decision making (see chart for details).    MDM Rules/Calculators/A&P                          59 year old female who presents to the ED today with worsening urticarial rash that began 2 days ago. Seen in the ED that same day and treated with Claritin and discharged home with Claritin and EpiPen. Has been taking liquid Benadryl at home without relief prompting her to call EMS today. Denies any throat swelling, shortness of breath, wheezing, abdominal pain, nausea, vomiting. Patient took 50 mg Benadryl 2 hours prior to calling EMS however she was given additional 50 mg Benadryl and EpiPen with EMS. It does appear that during last ED visit it was discussed regarding risks and benefits of starting prednisone, it was decided to withhold his own at this time given her rash was very minimal. Given she is here in the ED today will provide oral Benadryl and oral Pepcid and have patient be monitored for 3-4 hours due to the fact that she was given epinephrine. Will order EKG. Exam patient is overall well-appearing. She has a very mild urticarial rash, no airway involvement. No wheezes.   Pt has been evaluated in the ED for over 3.5 hours. She reports improvement in her symptoms. HR was initially tachycardic likely from the epi however this has decreased to the 90s. Will discharge home at this time with prednisone for the next few days and close PCP follow up. Pt has an appointment on 12/16. Advised to keep. She is instructed on strict return precautions. She is in agreement with plan and stable for discharge home.   This note was prepared  using Systems analyst and may include unintentional dictation errors due to the inherent limitations of voice recognition software.  Final Clinical Impression(s) / ED Diagnoses Final diagnoses:  Hives    Rx / DC Orders ED Discharge Orders         Ordered    predniSONE (DELTASONE) 50 MG tablet  Daily        04/13/20 2210            Discharge Instructions     Please follow up with your PCP as scheduled on 12/16 Pick up the prednisone and take as prescribed for the next 4 days. Continue taking Benadryl daily for your rash.   You were prescribed an epi pen during your last visit - please pick this up so you can have incase of any worsening symptoms. If you begin to have any throat swelling, difficulty breathing, wheezing use the epi pen IMMEDIATELY.   Return to the ED for any worsening symptoms       Eustaquio Maize, Hershal Coria 04/13/20 2212    Lucrezia Starch, MD 04/13/20 2245

## 2020-04-13 NOTE — ED Triage Notes (Signed)
Brought by ems from home.  Reports seen on Wednesday for hives all over.  Continues to have hives.  Reports it eased off but never got better.  Took benadryl 50mg  2 hours pta with no relief.  Had another 50mg  po via ems and epi 0.3mg /1000 IM.

## 2020-04-18 ENCOUNTER — Ambulatory Visit (HOSPITAL_COMMUNITY)
Admission: RE | Admit: 2020-04-18 | Discharge: 2020-04-18 | Disposition: A | Discharge status: Home / Self Care | Payer: Medicare HMO | Source: Ambulatory Visit | Attending: Pulmonary Disease | Admitting: Pulmonary Disease

## 2020-04-18 ENCOUNTER — Other Ambulatory Visit: Payer: Self-pay

## 2020-04-18 DIAGNOSIS — R9389 Abnormal findings on diagnostic imaging of other specified body structures: Secondary | ICD-10-CM

## 2020-04-19 ENCOUNTER — Encounter: Payer: Self-pay | Admitting: *Deleted

## 2020-04-19 NOTE — Progress Notes (Signed)
Letter mailed to the pt. 

## 2020-05-16 ENCOUNTER — Other Ambulatory Visit: Payer: Self-pay

## 2020-05-16 ENCOUNTER — Ambulatory Visit (INDEPENDENT_AMBULATORY_CARE_PROVIDER_SITE_OTHER): Payer: Medicare HMO | Admitting: Family Medicine

## 2020-05-16 ENCOUNTER — Encounter: Payer: Self-pay | Admitting: Family Medicine

## 2020-05-16 VITALS — BP 157/101 | HR 65 | Ht 63.0 in | Wt 205.0 lb

## 2020-05-16 DIAGNOSIS — G4733 Obstructive sleep apnea (adult) (pediatric): Secondary | ICD-10-CM | POA: Diagnosis not present

## 2020-05-16 DIAGNOSIS — Z9989 Dependence on other enabling machines and devices: Secondary | ICD-10-CM | POA: Diagnosis not present

## 2020-05-16 DIAGNOSIS — M4802 Spinal stenosis, cervical region: Secondary | ICD-10-CM

## 2020-05-16 NOTE — Progress Notes (Signed)
I have read the note, and I agree with the clinical assessment and plan.  Zaylah Blecha A. Bethenny Losee, MD, PhD, FAAN Certified in Neurology, Clinical Neurophysiology, Sleep Medicine, Pain Medicine and Neuroimaging  [facility] [address], [address] [place], [place] [address] [phone]  

## 2020-05-16 NOTE — Patient Instructions (Signed)
Please continue using your CPAP regularly. While your insurance requires that you use CPAP at least 4 hours each night on 70% of the nights, I recommend, that you not skip any nights and use it throughout the night if you can. Getting used to CPAP and staying with the treatment long term does take time and patience and discipline. Untreated obstructive sleep apnea when it is moderate to severe can have an adverse impact on cardiovascular health and raise her risk for heart disease, arrhythmias, hypertension, congestive heart failure, stroke and diabetes. Untreated obstructive sleep apnea causes sleep disruption, nonrestorative sleep, and sleep deprivation. This can have an impact on your day to day functioning and cause daytime sleepiness and impairment of cognitive function, memory loss, mood disturbance, and problems focussing. Using CPAP regularly can improve these symptoms.   Follow up closely with PCP and pulmonology. Resume CPAP as soon as you are able to work on using machine every night for at least 4 hours.   Follow up in 4 months   Sleep Apnea Sleep apnea affects breathing during sleep. It causes breathing to stop for a short time or to become shallow. It can also increase the risk of:  Heart attack.  Stroke.  Being very overweight (obese).  Diabetes.  Heart failure.  Irregular heartbeat. The goal of treatment is to help you breathe normally again. What are the causes? There are three kinds of sleep apnea:  Obstructive sleep apnea. This is caused by a blocked or collapsed airway.  Central sleep apnea. This happens when the brain does not send the right signals to the muscles that control breathing.  Mixed sleep apnea. This is a combination of obstructive and central sleep apnea. The most common cause of this condition is a collapsed or blocked airway. This can happen if:  Your throat muscles are too relaxed.  Your tongue and tonsils are too large.  You are  overweight.  Your airway is too small.   What increases the risk?  Being overweight.  Smoking.  Having a small airway.  Being older.  Being female.  Drinking alcohol.  Taking medicines to calm yourself (sedatives or tranquilizers).  Having family members with the condition. What are the signs or symptoms?  Trouble staying asleep.  Being sleepy or tired during the day.  Getting angry a lot.  Loud snoring.  Headaches in the morning.  Not being able to focus your mind (concentrate).  Forgetting things.  Less interest in sex.  Mood swings.  Personality changes.  Feelings of sadness (depression).  Waking up a lot during the night to pee (urinate).  Dry mouth.  Sore throat. How is this diagnosed?  Your medical history.  A physical exam.  A test that is done when you are sleeping (sleep study). The test is most often done in a sleep lab but may also be done at home. How is this treated?  Sleeping on your side.  Using a medicine to get rid of mucus in your nose (decongestant).  Avoiding the use of alcohol, medicines to help you relax, or certain pain medicines (narcotics).  Losing weight, if needed.  Changing your diet.  Not smoking.  Using a machine to open your airway while you sleep, such as: ? An oral appliance. This is a mouthpiece that shifts your lower jaw forward. ? A CPAP device. This device blows air through a mask when you breathe out (exhale). ? An EPAP device. This has valves that you put in each nostril. ?  A BPAP device. This device blows air through a mask when you breathe in (inhale) and breathe out.  Having surgery if other treatments do not work. It is important to get treatment for sleep apnea. Without treatment, it can lead to:  High blood pressure.  Coronary artery disease.  In men, not being able to have an erection (impotence).  Reduced thinking ability.   Follow these instructions at home: Lifestyle  Make changes  that your doctor recommends.  Eat a healthy diet.  Lose weight if needed.  Avoid alcohol, medicines to help you relax, and some pain medicines.  Do not use any products that contain nicotine or tobacco, such as cigarettes, e-cigarettes, and chewing tobacco. If you need help quitting, ask your doctor. General instructions  Take over-the-counter and prescription medicines only as told by your doctor.  If you were given a machine to use while you sleep, use it only as told by your doctor.  If you are having surgery, make sure to tell your doctor you have sleep apnea. You may need to bring your device with you.  Keep all follow-up visits as told by your doctor. This is important. Contact a doctor if:  The machine that you were given to use during sleep bothers you or does not seem to be working.  You do not get better.  You get worse. Get help right away if:  Your chest hurts.  You have trouble breathing in enough air.  You have an uncomfortable feeling in your back, arms, or stomach.  You have trouble talking.  One side of your body feels weak.  A part of your face is hanging down. These symptoms may be an emergency. Do not wait to see if the symptoms will go away. Get medical help right away. Call your local emergency services (911 in the [place] Do not drive yourself to the hospital. Summary  This condition affects breathing during sleep.  The most common cause is a collapsed or blocked airway.  The goal of treatment is to help you breathe normally while you sleep. This information is not intended to replace advice given to you by your health care provider. Make sure you discuss any questions you have with your health care provider. Document Revised: 02/05/2018 Document Reviewed: 12/15/2017 Elsevier Patient Education  [place].

## 2020-05-16 NOTE — Progress Notes (Signed)
PATIENT: Wendy Leblanc DOB: 03-28-1961  REASON FOR VISIT: follow up HISTORY FROM: patient  Chief Complaint  Patient presents with  . Follow-up    RM 2 alone Pt is well, other than asthma things are good     HISTORY OF PRESENT ILLNESS: Today 05/16/20  She returns today for CPAP follow up.  Unfortunately, she has not used CPAP over the past 30 days.  She reports that she has had more difficulty with asthma exacerbations.  She is using albuterol and Advair as prescribed.  PCP recently ordered nebulizers.  She has scheduled follow-up with PCP tomorrow.  Blood pressure has been elevated due to discontinuation of blood pressure medicines.  She reports having a reaction with last prescription but is never had any concerns with blood pressure medication in the past.  Fortunately, she is asymptomatic today.  She will discuss with primary care tomorrow.  Neck pain is stable.  She has completed physical therapy.  She continues gabapentin and is doing well.  History (copied from previous notes) 02/07/2020 Wendy Leblanc is a 60 y.o. female here today for follow up for cervical stenosis and recently diagnosed OSA started on CPAP therapy.   She reports gabapentin has worked well for neck pain and dysesthesias. She is taking 300/300/600. No obvious adverse effects. No falls. She continues to use cane. She has participated in PT and feels this has been helpful. She has 12 more sessions.   She was diagnosed with moderate OSA. She reports doing well with CPAP therapy initially. She had cataract surgery, first on left eye then right, over the past month that has interrupted CPAP usage. She does note improvement in sleep quality and daytime energy when using CPAP consistently. No concerns with CPAP or supplies.   Compliance report dated 12/08/2019 through 02/05/2020 reveals that she used CPAP 25 of the past 60 days for compliance of 42%.  She is CPAP greater than 4 hours 9 of the past 60 days for  compliance of 15%.  Average usage on days used was 3 hours and 31 minutes.  Residual AHI was 2.6 on 5 to 15 cm of water and an EPR of 3.  There was no significant leak noted.  HISTORY: (copied from Dr Garth Bigness note on 09/30/2019)  I had the pleasure of seeing your patient, Wendy Leblanc, at [facility] for neurologic consultation regarding her carpal tunnel syndrome and gait disturbance.  She is a 60 year old woman who has had multiple falls due to her legs giving out after she walks a while. She has been using a cane since a possible TIA in September 2019.  She feels she is slowly worsening.   She has neck pain, worse with bending.   She notes pain radiates into shoulders and down her arms into all fingers but more on the thumb sie than 5th finger side.    She feels weak in both her arms and hands.   The right is weaker than the left now but in the past, the left was worse.    She has urinary frequency.   She has insomnia and wakes up every few hours (sometimes for pain and sometimes for bladder).    She had CTS and had a carpal tunnel release on the right.    Since she had no benefit she did not proceed with left CTR.  Vascular risks: Hypertension, borderline DM, hyperlipidemia, smoking (1/3 ppd now but up to 1 ppd in the past, starting  age 20).   Possible OSA  In September 2019 she had chest pain and syncope.   When she regained consciousness, she had left side weakness which quickly improved to baseline.   No stroke was found on CT or MRI and no significant arterial stenosis.     She also has snoring, EDS and witnessed OSA.   EPWORTH SLEEPINESS SCALE  On a scale of 0 - 3 what is the chance of dozing:  Sitting and Reading:                          3 Watching TV:                                      3 Sitting inactive in a public place:       2 Passenger in car for one hour:          3 Lying down to rest in the afternoon:  3 Sitting and talking to someone:          2 Sitting quietly after lunch:                  3 In a car, stopped in traffic:                 0 (does not snore)  Total (out of 24): 19/24  Severe sleepiness   I personally reviewed the MRI of the brain dated 01/24/2018.  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   She also had a CT angiogram of the neck 01/23/2018.  I personally reviewed the images.  There were no significant arterial issues.  She did have significant degenerative changes from C3-C7 with reduced disc height as well as uncovertebral spurring, mild facet hypertrophy and possibility of spinal stenosis at these levels.  There were no acute findings.  I also reviewed recent lab work from 07/25/2019.  CBC CMP, lipid panel were unremarkable.  TSH was normal but hemoglobin A1c was minimally elevated at 5.8    REVIEW OF SYSTEMS: Out of a complete 14 system review of symptoms, the patient complains only of the following symptoms, neck pain, imbalance, numbness tinging, fatigue and all other reviewed systems are negative.  ESS:14   ALLERGIES: Allergies  Allergen Reactions  . Penicillins     ANAPHYLAXIS   . Albuterol Sulfate     HIVES  . Contrast Media [Iodinated Diagnostic Agents]     hives  . Sulfur     HIVES     HOME MEDICATIONS: Outpatient Medications Prior to Visit  Medication Sig Dispense Refill  . acetaminophen (TYLENOL) 500 MG tablet Take 500 mg by mouth every 6 (six) hours as needed.    Marland Kitchen albuterol (VENTOLIN HFA) 108 (90 Base) MCG/ACT inhaler     . aspirin EC 81 MG tablet Take 81 mg by mouth daily.    Marland Kitchen atorvastatin (LIPITOR) 20 MG tablet Take 20 mg by mouth daily.    . benzonatate (TESSALON) 100 MG capsule Take by mouth 3 (three) times daily as needed for cough.    . Cholecalciferol (VITAMIN D3 PO) Take by mouth. 1000u daily    . cyclobenzaprine (FLEXERIL) 5 MG tablet Take 5 mg by mouth 3 (three) times daily as needed.    Marland Kitchen EPINEPHrine 0.3 mg/0.3 mL IJ SOAJ  injection Inject  0.3 mg into the muscle as needed for anaphylaxis. 1 each 0  . escitalopram (LEXAPRO) 10 MG tablet Take 10 mg by mouth daily.    . Fluticasone-Salmeterol (WIXELA INHUB) 250-50 MCG/DOSE AEPB Inhale 1 puff into the lungs in the morning and at bedtime. 60 each 6  . gabapentin (NEURONTIN) 300 MG capsule One po q AM, one po q PM and 2 po qHS 120 capsule 5  . imipramine (TOFRANIL) 25 MG tablet Take 1 tablet (25 mg total) by mouth at bedtime. 30 tablet 5  . lidocaine (LIDODERM) 5 %     . loratadine (CLARITIN) 10 MG tablet Take 1 tablet (10 mg total) by mouth daily as needed for allergies or itching (rash). 6 tablet 0  . nicotine (NICODERM CQ) 7 mg/24hr patch Place 1 patch (7 mg total) onto the skin daily. 28 patch 0  . omeprazole (PRILOSEC) 20 MG capsule Take 20 mg by mouth daily.    Marland Kitchen topiramate (TOPAMAX) 25 MG tablet Take 25 mg by mouth 2 (two) times daily.  0  . amLODipine (NORVASC) 10 MG tablet Take 10 mg by mouth daily. (Patient not taking: Reported on 05/16/2020)  0  . lisinopril-hydrochlorothiazide (PRINZIDE,ZESTORETIC) 20-25 MG tablet Take 0.5 tablets by mouth daily.  (Patient not taking: Reported on 05/16/2020)  0   No facility-administered medications prior to visit.    PAST MEDICAL HISTORY: Past Medical History:  Diagnosis Date  . Anxiety   . Arthritis   . Carpal tunnel syndrome   . Cataract   . CVA (cerebral vascular accident) ([place])   . Depression   . Gait abnormality   . Hyperlipidemia   . Hypertension   . Migraine   . Osteoarthritis   . Smoker   . Stroke [facility]) 2019    PAST SURGICAL HISTORY: Past Surgical History:  Procedure Laterality Date  . ABDOMINAL HYSTERECTOMY    . CARPAL TUNNEL RELEASE    . hemorrhoid surgery    . TUBAL LIGATION      FAMILY HISTORY: Family History  Problem Relation Age of Onset  . Breast cancer Mother   . Heart disease Mother   . Breast cancer Sister   . Diabetes Brother   . Diabetes Sister   . Colon cancer Neg Hx   .  Esophageal cancer Neg Hx   . Rectal cancer Neg Hx   . Stomach cancer Neg Hx   . Colon polyps Neg Hx     SOCIAL HISTORY: Social History   Socioeconomic History  . Marital status: Widowed    Spouse name: Not on file  . Number of children: 2  . Years of education: Not on file  . Highest education level: 12th grade  Occupational History  . Not on file  Tobacco Use  . Smoking status: Current Every Day Smoker    Packs/day: 0.50    Types: Cigarettes  . Smokeless tobacco: Never Used  Vaping Use  . Vaping Use: Former  Substance and Sexual Activity  . Alcohol use: Not Currently  . Drug use: Never  . Sexual activity: Not on file  Other Topics Concern  . Not on file  Social History Narrative  . Not on file   Social Determinants of Health   Financial Resource Strain: Not on file  Food Insecurity: Not on file  Transportation Needs: Not on file  Physical Activity: Not on file  Stress: Not on file  Social Connections: Not on file  Intimate Partner Violence: Not on file  PHYSICAL EXAM  Vitals:   05/16/20 0943  BP: (!) 157/101  Pulse: 65  Weight: 205 lb (93 kg)  Height: 5\' 3"  (1.6 m)   Body mass index is 36.31 kg/m.  Generalized: Well developed, in no acute distress  Cardiology: normal rate and rhythm, no murmur noted Respiratory: clear to auscultation bilaterally, no wheezing or rhonchi Neurological examination  Mentation: Alert oriented to time, place, history taking. Follows all commands speech and language fluent Cranial nerve II-XII: Pupils were equal round reactive to light. Extraocular movements were full, visual field were full  Motor: The motor testing reveals 5 over 5 strength of all 4 extremities. Good symmetric motor tone is noted throughout.  Gait and station: Gait is stable with cane     DIAGNOSTIC DATA (LABS, IMAGING, TESTING) - I reviewed patient records, labs, notes, testing and imaging myself where available.  No flowsheet data found.    Lab Results  Component Value Date   WBC 5.2 01/25/2018   HGB 11.8 (L) 01/25/2018   HCT 37.9 01/25/2018   MCV 90.9 01/25/2018   PLT 185 01/25/2018      Component Value Date/Time   NA 143 01/17/2020 0812   K 3.7 01/17/2020 0812   CL 101 01/17/2020 0812   CO2 28 01/17/2020 0812   GLUCOSE 86 01/17/2020 0812   GLUCOSE 91 01/25/2018 0553   BUN 16 01/17/2020 0812   CREATININE 0.96 01/17/2020 0812   CALCIUM 9.1 01/17/2020 0812   PROT 7.2 01/23/2018 1648   ALBUMIN 4.2 01/23/2018 1648   AST 39 01/23/2018 1648   ALT 14 01/23/2018 1648   ALKPHOS 85 01/23/2018 1648   BILITOT 0.8 01/23/2018 1648   GFRNONAA 64 01/17/2020 0812   GFRAA 74 01/17/2020 0812   Lab Results  Component Value Date   CHOL 115 01/24/2018   HDL 51 01/24/2018   LDLCALC 58 01/24/2018   TRIG 32 01/24/2018   CHOLHDL 2.3 01/24/2018   Lab Results  Component Value Date   HGBA1C 5.7 (H) 01/24/2018   No results found for: VITAMINB12 No results found for: TSH     ASSESSMENT AND PLAN 60 y.o. year old female  has a past medical history of Anxiety, Arthritis, Carpal tunnel syndrome, Cataract, CVA (cerebral vascular accident) ([place]), Depression, Gait abnormality, Hyperlipidemia, Hypertension, Migraine, Osteoarthritis, Smoker, and Stroke ([place]) (2019). here with     ICD-10-CM   1. OSA on CPAP  G47.33    Z99.89   2. Cervical stenosis of spinal canal  M48.02      Brie has had difficulty with asthma exacerbation over the past 30 days and has been unable to use CPAP therapy.  Review of 90-day report shows 32% daily compliance and 16% 4-hour compliance.  I have reviewed risk of untreated sleep apnea.  We have discussed options and management of sleep apnea.  She does report feeling better rested when using CPAP consistently.  She wishes to continue CPAP therapy.  She has a scheduled follow-up with primary care tomorrow to address blood pressure and asthma concerns.  She was encouraged to resume CPAP as soon as she is  able.  I will repeat download in 6 weeks to assess compliance.  She will continue gabapentin for neck pain.  I will bring her back in for follow-up in 4 months.  She verbalizes understanding and agreement with this plan.   No orders of the defined types were placed in this encounter.    No orders of the defined types were placed in  this encounter.     I spent 35 minutes with the patient. 50% of this time was spent counseling and educating patient on plan of care and medications.     Debbora Presto, FNP-C 05/16/2020, 11:11 AM [facility] [address], [place], [place] [address] [phone]

## 2020-06-13 ENCOUNTER — Ambulatory Visit (INDEPENDENT_AMBULATORY_CARE_PROVIDER_SITE_OTHER): Payer: Medicare HMO | Admitting: Pulmonary Disease

## 2020-06-13 ENCOUNTER — Ambulatory Visit: Payer: Medicare HMO | Admitting: Primary Care

## 2020-06-13 ENCOUNTER — Other Ambulatory Visit: Payer: Self-pay

## 2020-06-13 DIAGNOSIS — J45909 Unspecified asthma, uncomplicated: Secondary | ICD-10-CM

## 2020-06-13 LAB — PULMONARY FUNCTION TEST
DL/VA % pred: 115 %
DL/VA: 4.89 ml/min/mmHg/L
DLCO cor % pred: 104 %
DLCO cor: 20.39 ml/min/mmHg
DLCO unc % pred: 104 %
DLCO unc: 20.39 ml/min/mmHg
FEF 25-75 Post: 1.19 L/sec
FEF 25-75 Pre: 1.21 L/sec
FEF2575-%Change-Post: -1 %
FEF2575-%Pred-Post: 59 %
FEF2575-%Pred-Pre: 60 %
FEV1-%Change-Post: 0 %
FEV1-%Pred-Post: 79 %
FEV1-%Pred-Pre: 79 %
FEV1-Post: 1.58 L
FEV1-Pre: 1.6 L
FEV1FVC-%Change-Post: -1 %
FEV1FVC-%Pred-Pre: 93 %
FEV6-%Change-Post: 0 %
FEV6-%Pred-Post: 87 %
FEV6-%Pred-Pre: 87 %
FEV6-Post: 2.16 L
FEV6-Pre: 2.14 L
FEV6FVC-%Pred-Post: 103 %
FEV6FVC-%Pred-Pre: 103 %
FVC-%Change-Post: 0 %
FVC-%Pred-Post: 84 %
FVC-%Pred-Pre: 84 %
FVC-Post: 2.16 L
FVC-Pre: 2.14 L
Post FEV1/FVC ratio: 73 %
Post FEV6/FVC ratio: 100 %
Pre FEV1/FVC ratio: 75 %
Pre FEV6/FVC Ratio: 100 %

## 2020-06-13 NOTE — Progress Notes (Signed)
PFT done today. 

## 2020-06-15 ENCOUNTER — Encounter: Payer: Self-pay | Admitting: Pulmonary Disease

## 2020-06-15 ENCOUNTER — Ambulatory Visit (INDEPENDENT_AMBULATORY_CARE_PROVIDER_SITE_OTHER): Payer: Medicare HMO | Admitting: Pulmonary Disease

## 2020-06-15 ENCOUNTER — Other Ambulatory Visit: Payer: Self-pay

## 2020-06-15 VITALS — BP 138/80 | HR 80 | Temp 97.6°F | Ht 63.0 in | Wt 211.4 lb

## 2020-06-15 DIAGNOSIS — J4540 Moderate persistent asthma, uncomplicated: Secondary | ICD-10-CM | POA: Diagnosis not present

## 2020-06-15 DIAGNOSIS — Z720 Tobacco use: Secondary | ICD-10-CM | POA: Diagnosis not present

## 2020-06-15 MED ORDER — MONTELUKAST SODIUM 10 MG PO TABS: 10.0000 mg | ORAL_TABLET | Freq: Every day | ORAL | 11 refills | Status: AC

## 2020-06-15 MED ORDER — FLUTICASONE-SALMETEROL 500-50 MCG/DOSE IN AEPB: 1.0000 | INHALATION_SPRAY | Freq: Two times a day (BID) | RESPIRATORY_TRACT | 11 refills | Status: DC

## 2020-06-15 NOTE — Progress Notes (Signed)
Synopsis: Referred by Vassie Moment, MD for abnormal chest imaging  Subjective:   PATIENT ID: Wendy Leblanc GENDER: female DOB: [birth year], MRN: [id number]  HPI  Chief Complaint  Patient presents with  . Asthma    Review 2/9 PFT.  Pt c/o fatigue, DOE, prod cough with clear mucus worse with exertion.    Wendy Leblanc is a 60 year old woman, daily smoker with history of hypertension, hyperlipidemia and CVA who returns to pulmonary clinic for follow up of asthma.  She was started on Wixella 250-71mcg 1 puff twice daily at last visit with some relief in her shortness of breath and wheezing. She continues to need albuterol 3 times during the day for continued wheezing. She also reports night time awakenings with shortness of breath.   She continues to smoke 3-5 cigarettes a day. Her daughter smokes in the home as well. She did not try nicotine patches after last visit as they were not covered by insurance.   She remains on CPAP at night. Continues to complain of fatigue.   She had CT chest scan in 04/2020 that showed resolution of the ground glass opacities in the left lower lobe noted on her CT coronary scan previously. No other concerning nodules noted.    PFTs on 06/13/20 are within normal limits. No obstructive or restrictive defects noted. No significant bronchodilator response (currently on wixella) and no diffusion defect.  OV 03/13/20: She has been having increased chest tightness/discomfort and shortness of breath over the past 2 months. She was evaluated by cardiology and had a CT coronary scan done which was notable for left lower lobe ground glass opacities. She reports having increasing cough, shortness of breath and chills at that time. She was treated with prednisone and an antibiotic around the time of that scan which she reported improvement in her symptoms since.   She has an albuterol inhaler which she was recently prescribed which she uses 2-3 times per day and does find  relief of her chest tightness and shortness of breath. The shortness of breath is worse at night which wakes her from sleep. She denies cough or wheezing at those times. Cold air and strong perfumes/colognes can trigger shortness of breath, cough and wheezing. She denies sinus congestion or drainage. She is taking omeprazole for GERD and does not report any issues with reflux symptoms.   She currently smokes 0.25 pack per day and had smoked 0.5 packs per day since the age of 28. She has slowed down on smoking due to the shortness of breath. She is interested in quitting smoking.  She was recently diagnosed with obstructive sleep apnea and is currently on auto-titrating CPAP 5-15cmH2O.  Per the neurology visit on 02/07/20: Compliance report dated 12/08/2019 through 02/05/2020 reveals that she used CPAP 25 of the past 60 days for compliance of 42%.  She is CPAP greater than 4 hours 9 of the past 60 days for compliance of 15%.  Average usage on days used was 3 hours and 31 minutes.  Residual AHI was 2.6 on 5 to 15 cm of water and an EPR of 3.  There was no significant leak noted.  There is significant history for asthma on both sides of her family.   Past Medical History:  Diagnosis Date  . Anxiety   . Arthritis   . Carpal tunnel syndrome   . Cataract   . CVA (cerebral vascular accident) ([place])   . Depression   . Gait abnormality   .  Hyperlipidemia   . Hypertension   . Migraine   . Osteoarthritis   . Smoker   . Stroke [facility]) 2019     Family History  Problem Relation Age of Onset  . Breast cancer Mother   . Heart disease Mother   . Breast cancer Sister   . Diabetes Brother   . Diabetes Sister   . Colon cancer Neg Hx   . Esophageal cancer Neg Hx   . Rectal cancer Neg Hx   . Stomach cancer Neg Hx   . Colon polyps Neg Hx      Social History   Socioeconomic History  . Marital status: Widowed    Spouse name: Not on file  . Number of children: 2  . Years of education: Not on file  .  Highest education level: 12th grade  Occupational History  . Not on file  Tobacco Use  . Smoking status: Current Every Day Smoker    Packs/day: 0.50    Years: 45.00    Pack years: 22.50    Types: Cigarettes  . Smokeless tobacco: Never Used  . Tobacco comment: down to 1/4 ppd 06/15/20  Vaping Use  . Vaping Use: Former  Substance and Sexual Activity  . Alcohol use: Not Currently  . Drug use: Never  . Sexual activity: Not on file  Other Topics Concern  . Not on file  Social History Narrative  . Not on file   Social Determinants of Health   Financial Resource Strain: Not on file  Food Insecurity: Not on file  Transportation Needs: Not on file  Physical Activity: Not on file  Stress: Not on file  Social Connections: Not on file  Intimate Partner Violence: Not on file     Allergies  Allergen Reactions  . Penicillins     ANAPHYLAXIS   . Albuterol Sulfate     HIVES  . Contrast Media [Iodinated Diagnostic Agents]     hives  . Elemental Sulfur     HIVES      Outpatient Medications Prior to Visit  Medication Sig Dispense Refill  . acetaminophen (TYLENOL) 500 MG tablet Take 500 mg by mouth every 6 (six) hours as needed.    Marland Kitchen albuterol (VENTOLIN HFA) 108 (90 Base) MCG/ACT inhaler     . amLODipine (NORVASC) 10 MG tablet Take 10 mg by mouth daily.  0  . aspirin EC 81 MG tablet Take 81 mg by mouth daily.    Marland Kitchen atorvastatin (LIPITOR) 20 MG tablet Take 20 mg by mouth daily.    . benzonatate (TESSALON) 100 MG capsule Take by mouth 3 (three) times daily as needed for cough.    . Cholecalciferol (VITAMIN D3 PO) Take by mouth. 1000u daily    . cyclobenzaprine (FLEXERIL) 5 MG tablet Take 5 mg by mouth 3 (three) times daily as needed.    Marland Kitchen EPINEPHrine 0.3 mg/0.3 mL IJ SOAJ injection Inject 0.3 mg into the muscle as needed for anaphylaxis. 1 each 0  . escitalopram (LEXAPRO) 10 MG tablet Take 10 mg by mouth daily.    Marland Kitchen gabapentin (NEURONTIN) 300 MG capsule One po q AM, one po q PM and  2 po qHS 120 capsule 5  . hydrALAZINE (APRESOLINE) 10 MG tablet Take 10 mg by mouth 4 (four) times daily.    Marland Kitchen imipramine (TOFRANIL) 25 MG tablet Take 1 tablet (25 mg total) by mouth at bedtime. 30 tablet 5  . lidocaine (LIDODERM) 5 %     . lisinopril-hydrochlorothiazide (  PRINZIDE,ZESTORETIC) 20-25 MG tablet Take 0.5 tablets by mouth daily.  0  . loratadine (CLARITIN) 10 MG tablet Take 1 tablet (10 mg total) by mouth daily as needed for allergies or itching (rash). 6 tablet 0  . nicotine (NICODERM CQ) 7 mg/24hr patch Place 1 patch (7 mg total) onto the skin daily. 28 patch 0  . omeprazole (PRILOSEC) 20 MG capsule Take 20 mg by mouth daily.    Marland Kitchen topiramate (TOPAMAX) 25 MG tablet Take 25 mg by mouth 2 (two) times daily.  0  . Fluticasone-Salmeterol (WIXELA INHUB) 250-50 MCG/DOSE AEPB Inhale 1 puff into the lungs in the morning and at bedtime. 60 each 6   No facility-administered medications prior to visit.    Review of Systems  Constitutional: Positive for malaise/fatigue. Negative for chills, diaphoresis, fever and weight loss.  HENT: Negative for congestion, sinus pain and sore throat.   Eyes: Negative.   Respiratory: Positive for shortness of breath and wheezing. Negative for cough, hemoptysis and sputum production.   Cardiovascular: Negative for chest pain, palpitations, orthopnea, claudication and leg swelling.  Gastrointestinal: Negative for abdominal pain, diarrhea, heartburn, nausea and vomiting.  Genitourinary: Negative.   Musculoskeletal: Negative for joint pain and myalgias.  Neurological: Negative for dizziness, weakness and headaches.  Endo/Heme/Allergies: Does not bruise/bleed easily.    Objective:   Vitals:   06/15/20 0855  BP: 138/80  Pulse: 80  Temp: 97.6 F (36.4 C)  TempSrc: Temporal  SpO2: 100%  Weight: 211 lb 6.4 oz (95.9 kg)  Height: 5\' 3"  (1.6 m)     Physical Exam Constitutional:      General: She is not in acute distress.    Appearance: She is obese.  She is not ill-appearing.  HENT:     Head: Normocephalic and atraumatic.     Nose: Nose normal.     Mouth/Throat:     Mouth: Mucous membranes are moist.     Pharynx: Oropharynx is clear.  Eyes:     General: No scleral icterus.    Extraocular Movements: Extraocular movements intact.     Conjunctiva/sclera: Conjunctivae normal.     Pupils: Pupils are equal, round, and reactive to light.  Cardiovascular:     Rate and Rhythm: Normal rate and regular rhythm.     Pulses: Normal pulses.     Heart sounds: Normal heart sounds. No murmur heard.   Pulmonary:     Effort: Pulmonary effort is normal.     Breath sounds: Normal breath sounds. No wheezing, rhonchi or rales.  Abdominal:     General: Bowel sounds are normal.     Palpations: Abdomen is soft.  Musculoskeletal:     Cervical back: Neck supple.     Right lower leg: No edema.     Left lower leg: No edema.  Lymphadenopathy:     Cervical: No cervical adenopathy.  Skin:    General: Skin is warm and dry.  Neurological:     General: No focal deficit present.     Mental Status: She is alert and oriented to person, place, and time. Mental status is at baseline.  Psychiatric:        Mood and Affect: Mood normal.        Behavior: Behavior normal.        Thought Content: Thought content normal.        Judgment: Judgment normal.     CBC    Component Value Date/Time   WBC 5.2 01/25/2018 0553   RBC 4.17  01/25/2018 0553   HGB 11.8 (L) 01/25/2018 0553   HCT 37.9 01/25/2018 0553   PLT 185 01/25/2018 0553   MCV 90.9 01/25/2018 0553   MCH 28.3 01/25/2018 0553   MCHC 31.1 01/25/2018 0553   RDW 12.8 01/25/2018 0553   LYMPHSABS 2.2 01/23/2018 1648   LYMPHSABS 2.2 01/23/2018 1648   MONOABS 0.4 01/23/2018 1648   MONOABS 0.5 01/23/2018 1648   EOSABS 0.1 01/23/2018 1648   EOSABS 0.1 01/23/2018 1648   BASOSABS 0.0 01/23/2018 1648   BASOSABS 0.0 01/23/2018 1648   Chest imaging: 04/18/20 CT Chest wo contrast Mediastinum/Nodes: No  enlarged mediastinal or axillary lymph nodes. Thyroid gland, trachea, and esophagus demonstrate no significant findings.  Lungs/Pleura: Previously noted left basilar pulmonary infiltrate has resolved. The lungs are clear. No focal pulmonary nodule or infiltrate. No pneumothorax or pleural effusion. Central airways are widely patent.  01/20/20 CT Coronary Ground-glass attenuation in the basal segments of the left lower lobe. Some septal thickening in the periphery of the right lower lobe. Within the visualized portions of the thorax there are no suspicious appearing pulmonary nodules or masses, there are no pleural effusions, no pneumothorax and no lymphadenopathy. Visualized portions of the upper abdomen are unremarkable. There are no aggressive appearing lytic or blastic lesions noted in the visualized portions of the skeleton.  CXR 09/09/2019 The heart size and mediastinal contours are within normal limits. Both lungs are clear. No pneumothorax or pleural effusion is noted. The visualized skeletal structures are unremarkable.  PFT: PFT Results Latest Ref Rng & Units 06/13/2020  FVC-Pre L 2.14  FVC-Predicted Pre % 84  FVC-Post L 2.16  FVC-Predicted Post % 84  Pre FEV1/FVC % % 75  Post FEV1/FCV % % 73  FEV1-Pre L 1.60  FEV1-Predicted Pre % 79  FEV1-Post L 1.58  DLCO uncorrected ml/min/mmHg 20.39  DLCO UNC% % 104  DLCO corrected ml/min/mmHg 20.39  DLCO COR %Predicted % 104  DLVA Predicted % 115    Echo: 01/24/2018 - Left ventricle: The cavity size was normal. Wall thickness was  normal. Systolic function was normal. The estimated ejection  fraction was in the range of 60% to 65%. Left ventricular  diastolic function parameters were normal.     Assessment & Plan:   Moderate persistent asthma without complication - Plan: montelukast (SINGULAIR) 10 MG tablet  Tobacco use  Discussion: Shaneequa Bahner is a 60 year old woman, daily smoker with history of hypertension,  hyperlipidemia and CVA who returns to pulmonary clinic for follow up of asthma.  She has noted some improvement on the wixella 250-63mcg 1 puff twice daily but continues to have a significant need for albuterol throughout the day and night. We will increase her wixella inhaler to 500-46mcg 1 puff twice daily. She can continue as needed albuterol. We will also start her on montelukast 10mg  daily for her asthma/allergies.   CT Coronary scan on 01/20/20 showed ground glass opacities within the left lower lobe that have resolved on dedicated CT chest on 04/18/20. She does not quite meet the criteria for lung cancer screening quite yet based on her pack years. No other concerning findings were noted on her CT chest.   Encouraged her to pickup nicotine patches 7mg  daily and to use nicotine lozenges as needed to quit smoking. Instructed her to have her and her daughter smoke outside the home and to encourage her daughter to quit smoking as well.   She is to follow up in 4 months.  Freda Jackson, MD  [facility] Office: [phone]   See Amion for Pager Details    Current Outpatient Medications:  .  acetaminophen (TYLENOL) 500 MG tablet, Take 500 mg by mouth every 6 (six) hours as needed., Disp: , Rfl:  .  albuterol (VENTOLIN HFA) 108 (90 Base) MCG/ACT inhaler, , Disp: , Rfl:  .  amLODipine (NORVASC) 10 MG tablet, Take 10 mg by mouth daily., Disp: , Rfl: 0 .  aspirin EC 81 MG tablet, Take 81 mg by mouth daily., Disp: , Rfl:  .  atorvastatin (LIPITOR) 20 MG tablet, Take 20 mg by mouth daily., Disp: , Rfl:  .  benzonatate (TESSALON) 100 MG capsule, Take by mouth 3 (three) times daily as needed for cough., Disp: , Rfl:  .  Cholecalciferol (VITAMIN D3 PO), Take by mouth. 1000u daily, Disp: , Rfl:  .  cyclobenzaprine (FLEXERIL) 5 MG tablet, Take 5 mg by mouth 3 (three) times daily as needed., Disp: , Rfl:  .  EPINEPHrine 0.3 mg/0.3 mL IJ SOAJ injection, Inject 0.3 mg into the  muscle as needed for anaphylaxis., Disp: 1 each, Rfl: 0 .  escitalopram (LEXAPRO) 10 MG tablet, Take 10 mg by mouth daily., Disp: , Rfl:  .  Fluticasone-Salmeterol (WIXELA INHUB) 500-50 MCG/DOSE AEPB, Inhale 1 puff into the lungs 2 (two) times daily., Disp: 60 each, Rfl: 11 .  gabapentin (NEURONTIN) 300 MG capsule, One po q AM, one po q PM and 2 po qHS, Disp: 120 capsule, Rfl: 5 .  hydrALAZINE (APRESOLINE) 10 MG tablet, Take 10 mg by mouth 4 (four) times daily., Disp: , Rfl:  .  imipramine (TOFRANIL) 25 MG tablet, Take 1 tablet (25 mg total) by mouth at bedtime., Disp: 30 tablet, Rfl: 5 .  lidocaine (LIDODERM) 5 %, , Disp: , Rfl:  .  lisinopril-hydrochlorothiazide (PRINZIDE,ZESTORETIC) 20-25 MG tablet, Take 0.5 tablets by mouth daily., Disp: , Rfl: 0 .  loratadine (CLARITIN) 10 MG tablet, Take 1 tablet (10 mg total) by mouth daily as needed for allergies or itching (rash)., Disp: 6 tablet, Rfl: 0 .  montelukast (SINGULAIR) 10 MG tablet, Take 1 tablet (10 mg total) by mouth at bedtime., Disp: 30 tablet, Rfl: 11 .  nicotine (NICODERM CQ) 7 mg/24hr patch, Place 1 patch (7 mg total) onto the skin daily., Disp: 28 patch, Rfl: 0 .  omeprazole (PRILOSEC) 20 MG capsule, Take 20 mg by mouth daily., Disp: , Rfl:  .  topiramate (TOPAMAX) 25 MG tablet, Take 25 mg by mouth 2 (two) times daily., Disp: , Rfl: 0

## 2020-06-15 NOTE — Patient Instructions (Addendum)
Start montelukast 10mg  daily for your allergies/asthma  We will increase your wixella dosing to 500-18mcg 1 puff twice daily  Continue to use albuterol as needed.   Use nicotine patch 7mg  daily and nicotine lozenges as needed to quit smoking.   You and your daughter need to smoke outside the home and it would be beneficial for your daughter to quit smoking as well.

## 2020-06-18 ENCOUNTER — Telehealth: Payer: Self-pay

## 2020-06-18 ENCOUNTER — Telehealth: Payer: Self-pay | Admitting: Family Medicine

## 2020-06-18 NOTE — Telephone Encounter (Signed)
Please let her know that I reviewed her compliance report. I am happy that she has resumed therapy. She was 67% compliant with daily usage and 13% compliant with 4 hour use. Remind her to continue working toward goal of daily and 4 hour compliance of at least 70%. Have her reach out for any concerns. TY!

## 2020-06-18 NOTE — Telephone Encounter (Signed)
I received advice note from Amy, I gave the patient a call in regards to her CPAP compliance.  I informed her that Amy was Happy that she resumed her therapy.  She was 67% compliant with daily usage and 13% compliant with 4-hour usage.  I reminded her to continue to work towards her goal and at least 4 hours compliant of at least 70%.  I advised her to contact our office with any concerns.  She understood

## 2020-08-23 ENCOUNTER — Emergency Department (HOSPITAL_COMMUNITY)
Admission: EM | Admit: 2020-08-23 | Discharge: 2020-08-23 | Disposition: A | Discharge status: Home / Self Care | Payer: Medicare HMO | Attending: Emergency Medicine | Admitting: Emergency Medicine

## 2020-08-23 ENCOUNTER — Emergency Department (HOSPITAL_COMMUNITY): Payer: Medicare HMO

## 2020-08-23 ENCOUNTER — Other Ambulatory Visit: Payer: Self-pay | Admitting: Neurology

## 2020-08-23 ENCOUNTER — Other Ambulatory Visit: Payer: Self-pay

## 2020-08-23 DIAGNOSIS — W19XXXA Unspecified fall, initial encounter: Secondary | ICD-10-CM | POA: Diagnosis not present

## 2020-08-23 DIAGNOSIS — M25551 Pain in right hip: Secondary | ICD-10-CM | POA: Insufficient documentation

## 2020-08-23 DIAGNOSIS — Z7982 Long term (current) use of aspirin: Secondary | ICD-10-CM | POA: Diagnosis not present

## 2020-08-23 DIAGNOSIS — Z79899 Other long term (current) drug therapy: Secondary | ICD-10-CM | POA: Diagnosis not present

## 2020-08-23 DIAGNOSIS — I10 Essential (primary) hypertension: Secondary | ICD-10-CM | POA: Insufficient documentation

## 2020-08-23 DIAGNOSIS — F17210 Nicotine dependence, cigarettes, uncomplicated: Secondary | ICD-10-CM | POA: Diagnosis not present

## 2020-08-23 MED ORDER — HYDROCODONE-ACETAMINOPHEN 5-325 MG PO TABS: 1.0000 | ORAL_TABLET | Freq: Four times a day (QID) | ORAL | 0 refills | Status: DC | PRN

## 2020-08-23 MED ORDER — HYDROCODONE-ACETAMINOPHEN 5-325 MG PO TABS
1.0000 | ORAL_TABLET | Freq: Once | ORAL | Status: AC
  Administered 2020-08-23: 1 via ORAL
  Filled 2020-08-23: qty 1

## 2020-08-23 NOTE — ED Provider Notes (Signed)
[facility] EMERGENCY DEPARTMENT Provider Note   CSN: [id number] Arrival date & time: 08/23/20  1726     History Chief Complaint  Patient presents with  . Fall    Wendy Leblanc is a 60 y.o. female.  60 year old female with prior medical history detailed below presents for evaluation.  Patient reports fall approximately 2 weeks prior.  Patient reports landing hard on her right hip.  She complains of significant pain to the right hip ever since this fall.  She was evaluated today by her PCP.  PCP directed [facility] to [facility] via EMS transport for further evaluation of reported right hip pain in the setting of recent fall.  Patient reports that her transport crew gave her pain medication and now she is pain-free.  She denies other complaint.  She ambulates at baseline with a cane.  She reports that despite her fall she has been able to " slowly get around" for the last 2 weeks.  The history is provided by the patient and medical records.  Fall This is a new problem. The current episode started more than 1 week ago. The problem occurs rarely. The problem has not changed since onset.Pertinent negatives include no chest pain, no abdominal pain, no headaches and no shortness of breath. Nothing aggravates the symptoms. Nothing relieves the symptoms.       Past Medical History:  Diagnosis Date  . Anxiety   . Arthritis   . Carpal tunnel syndrome   . Cataract   . CVA (cerebral vascular accident) ([place])   . Depression   . Gait abnormality   . Hyperlipidemia   . Hypertension   . Migraine   . Osteoarthritis   . Smoker   . Stroke [facility] 2019    Patient Active Problem List   Diagnosis Date Noted  . Cervical stenosis of spinal canal 12/06/2019  . Arm weakness 12/06/2019  . Carpal tunnel syndrome, bilateral 12/06/2019  . Dysesthesia 12/06/2019  . Neck pain 12/06/2019  . OSA (obstructive sleep apnea) 12/06/2019  . Hypercholesterolemia 11/27/2019  . Severe  obesity (BMI 35.0-39.9) with comorbidity ([place]) 11/27/2019  . Smoking 11/27/2019  . Intermittent claudication ([place]) 11/27/2019  . TIA (transient ischemic attack) 01/24/2018  . Essential hypertension 01/24/2018  . Depression 01/24/2018    Past Surgical History:  Procedure Laterality Date  . ABDOMINAL HYSTERECTOMY    . CARPAL TUNNEL RELEASE    . hemorrhoid surgery    . TUBAL LIGATION       OB History    Gravida  3   Para      Term      Preterm      AB  1   Living  2     SAB  1   IAB      Ectopic      Multiple      Live Births  2           Family History  Problem Relation Age of Onset  . Breast cancer Mother   . Heart disease Mother   . Breast cancer Sister   . Diabetes Brother   . Diabetes Sister   . Colon cancer Neg Hx   . Esophageal cancer Neg Hx   . Rectal cancer Neg Hx   . Stomach cancer Neg Hx   . Colon polyps Neg Hx     Social History   Tobacco Use  . Smoking status: Current Every Day Smoker    Packs/day:  0.50    Years: 45.00    Pack years: 22.50    Types: Cigarettes  . Smokeless tobacco: Never Used  . Tobacco comment: down to 1/4 ppd 06/15/20  Vaping Use  . Vaping Use: Former  Substance Use Topics  . Alcohol use: Not Currently  . Drug use: Never    Home Medications Prior to Admission medications   Medication Sig Start Date End Date Taking? Authorizing Provider  acetaminophen (TYLENOL) 500 MG tablet Take 500 mg by mouth every 6 (six) hours as needed.    [provider]  albuterol (VENTOLIN HFA) 108 (90 Base) MCG/ACT inhaler  02/05/20   [provider]  amLODipine (NORVASC) 10 MG tablet Take 10 mg by mouth daily. 12/29/17   [provider]  aspirin EC 81 MG tablet Take 81 mg by mouth daily.    [provider]  atorvastatin (LIPITOR) 20 MG tablet Take 20 mg by mouth daily.    [provider]  benzonatate (TESSALON) 100 MG capsule Take by mouth 3 (three) times daily as needed for cough.     [provider]  Cholecalciferol (VITAMIN D3 PO) Take by mouth. 1000u daily    [provider]  cyclobenzaprine (FLEXERIL) 5 MG tablet Take 5 mg by mouth 3 (three) times daily as needed. 04/03/19   [provider]  EPINEPHrine 0.3 mg/0.3 mL IJ SOAJ injection Inject 0.3 mg into the muscle as needed for anaphylaxis. 04/11/20   Luna Fuse, MD  escitalopram (LEXAPRO) 10 MG tablet Take 10 mg by mouth daily.    [provider]  Fluticasone-Salmeterol (WIXELA INHUB) 500-50 MCG/DOSE AEPB Inhale 1 puff into the lungs 2 (two) times daily. 06/15/20   Freddi Starr, MD  gabapentin (NEURONTIN) 300 MG capsule TAKE 1 CAPSULE BY MOUTH DAILY IN THE MORNING, 1 TABLET DAILY IN THE EVENING AND 2 TABLETS AT BEDTIME 08/23/20   Lomax, Amy, NP  hydrALAZINE (APRESOLINE) 10 MG tablet Take 10 mg by mouth 4 (four) times daily.    [provider]  imipramine (TOFRANIL) 25 MG tablet Take 1 tablet (25 mg total) by mouth at bedtime. 09/30/19   Sater, Nanine Means, MD  lidocaine (LIDODERM) 5 %  02/13/20   [provider]  lisinopril-hydrochlorothiazide (PRINZIDE,ZESTORETIC) 20-25 MG tablet Take 0.5 tablets by mouth daily. 12/29/17   [provider]  loratadine (CLARITIN) 10 MG tablet Take 1 tablet (10 mg total) by mouth daily as needed for allergies or itching (rash). 04/11/20   Luna Fuse, MD  montelukast (SINGULAIR) 10 MG tablet Take 1 tablet (10 mg total) by mouth at bedtime. 06/15/20   Freddi Starr, MD  nicotine (NICODERM CQ) 7 mg/24hr patch Place 1 patch (7 mg total) onto the skin daily. 03/13/20   Freddi Starr, MD  omeprazole (PRILOSEC) 20 MG capsule Take 20 mg by mouth daily.    [provider]  topiramate (TOPAMAX) 25 MG tablet Take 25 mg by mouth 2 (two) times daily. 12/29/17   [provider]    Allergies    Penicillins, Albuterol sulfate, Contrast media [iodinated diagnostic agents], and Elemental sulfur  Review of Systems    Review of Systems  Respiratory: Negative for shortness of breath.   Cardiovascular: Negative for chest pain.  Gastrointestinal: Negative for abdominal pain.  Neurological: Negative for headaches.  All other systems reviewed and are negative.   Physical Exam Updated Vital Signs BP (!) 165/101 (BP Location: Right Arm)   Pulse 72  Temp 98.1 F (36.7 C) (Oral)   Resp 14   Ht 5\' 3"  (1.6 m)   Wt 93.9 kg   SpO2 95%   BMI 36.67 kg/m   Physical Exam Vitals and nursing note reviewed.  Constitutional:      General: She is not in acute distress.    Appearance: Normal appearance. She is well-developed.  HENT:     Head: Normocephalic and atraumatic.  Eyes:     Conjunctiva/sclera: Conjunctivae normal.     Pupils: Pupils are equal, round, and reactive to light.  Cardiovascular:     Rate and Rhythm: Normal rate and regular rhythm.     Heart sounds: Normal heart sounds.  Pulmonary:     Effort: Pulmonary effort is normal. No respiratory distress.     Breath sounds: Normal breath sounds.  Abdominal:     General: There is no distension.     Palpations: Abdomen is soft.     Tenderness: There is no abdominal tenderness.  Musculoskeletal:        General: No tenderness, deformity or signs of injury. Normal range of motion.     Cervical back: Normal range of motion and neck supple.  Skin:    General: Skin is warm and dry.  Neurological:     General: No focal deficit present.     Mental Status: She is alert and oriented to person, place, and time. Mental status is at baseline.     ED Results / Procedures / Treatments   Labs (all labs ordered are listed, but only abnormal results are displayed) Labs Reviewed - No data to display  EKG None  Radiology DG Hip Unilat W or Wo Pelvis 2-3 Views Right  Result Date: 08/23/2020 CLINICAL DATA:  Right hip pain, fall EXAM: DG HIP (WITH OR WITHOUT PELVIS) 2-3V RIGHT COMPARISON:  None. FINDINGS: Degenerative changes in the hip joints with  joint space narrowing and early spurring. SI joints symmetric and unremarkable. No acute bony abnormality. Specifically, no fracture, subluxation, or dislocation. IMPRESSION: No acute bony abnormality. Electronically Signed   By: Rolm Baptise M.D.   On: 08/23/2020 18:27    Procedures Procedures   Medications Ordered in ED Medications - No data to display  ED Course  I have reviewed the triage vital signs and the nursing notes.  Pertinent labs & imaging results that were available during my care of the patient were reviewed by me and considered in my medical decision making (see chart for details).    MDM Rules/Calculators/A&P                          MDM   MSE complete  Wendy Leblanc was evaluated in Emergency Department on 08/23/2020 for the symptoms described in the history of present illness. She was evaluated in the context of the global COVID-19 pandemic, which necessitated consideration that the patient might be at risk for infection with the SARS-CoV-2 virus that causes COVID-19. Institutional protocols and algorithms that pertain to the evaluation of patients at risk for COVID-19 are in a state of rapid change based on information released by regulatory bodies including the CDC and federal and state organizations. These policies and algorithms were followed during the patient's care in the ED.  Patient is presenting for evaluation of right hip discomfort after fall.  Patient without significant pain on evaluation.  Imaging did not reveal evidence of acute fracture or other significant injury.  Patient does understand need  for close follow-up.  Patient desires discharge.  Patient has family at home who are able to assist her.  Strict return precautions given and understood.  Final Clinical Impression(s) / ED Diagnoses Final diagnoses:  Right hip pain    Rx / DC Orders ED Discharge Orders         Ordered    HYDROcodone-acetaminophen (NORCO/VICODIN) 5-325 MG  tablet  Every 6 hours PRN        08/23/20 1850           Valarie Merino, MD 08/23/20 1851

## 2020-08-23 NOTE — Discharge Instructions (Addendum)
Please return for any problem.  °

## 2020-08-23 NOTE — ED Triage Notes (Signed)
Pt arives via EMS from [facility] office. Pt fell 2 weeks ago when knees gave out. Unable to bear weight since. Right hip pain with shortening reported by EMS  Drs office gave 60mg  Toradol  100 mcg fent by EMS  20gLAC 152 palpated BP 70 P 100% Alert and oriented X4

## 2020-09-11 NOTE — Patient Instructions (Incomplete)
Please continue using your CPAP regularly. While your insurance requires that you use CPAP at least 4 hours each night on 70% of the nights, I recommend, that you not skip any nights and use it throughout the night if you can. Getting used to CPAP and staying with the treatment long term does take time and patience and discipline. Untreated obstructive sleep apnea when it is moderate to severe can have an adverse impact on cardiovascular health and raise her risk for heart disease, arrhythmias, hypertension, congestive heart failure, stroke and diabetes. Untreated obstructive sleep apnea causes sleep disruption, nonrestorative sleep, and sleep deprivation. This can have an impact on your day to day functioning and cause daytime sleepiness and impairment of cognitive function, memory loss, mood disturbance, and problems focussing. Using CPAP regularly can improve these symptoms.     Sleep Apnea Sleep apnea affects breathing during sleep. It causes breathing to stop for a short time or to become shallow. It can also increase the risk of:  Heart attack.  Stroke.  Being very overweight (obese).  Diabetes.  Heart failure.  Irregular heartbeat. The goal of treatment is to help you breathe normally again. What are the causes? There are three kinds of sleep apnea:  Obstructive sleep apnea. This is caused by a blocked or collapsed airway.  Central sleep apnea. This happens when the brain does not send the right signals to the muscles that control breathing.  Mixed sleep apnea. This is a combination of obstructive and central sleep apnea. The most common cause of this condition is a collapsed or blocked airway. This can happen if:  Your throat muscles are too relaxed.  Your tongue and tonsils are too large.  You are overweight.  Your airway is too small.   What increases the risk?  Being overweight.  Smoking.  Having a small airway.  Being older.  Being female.  Drinking  alcohol.  Taking medicines to calm yourself (sedatives or tranquilizers).  Having family members with the condition. What are the signs or symptoms?  Trouble staying asleep.  Being sleepy or tired during the day.  Getting angry a lot.  Loud snoring.  Headaches in the morning.  Not being able to focus your mind (concentrate).  Forgetting things.  Less interest in sex.  Mood swings.  Personality changes.  Feelings of sadness (depression).  Waking up a lot during the night to pee (urinate).  Dry mouth.  Sore throat. How is this diagnosed?  Your medical history.  A physical exam.  A test that is done when you are sleeping (sleep study). The test is most often done in a sleep lab but may also be done at home. How is this treated?  Sleeping on your side.  Using a medicine to get rid of mucus in your nose (decongestant).  Avoiding the use of alcohol, medicines to help you relax, or certain pain medicines (narcotics).  Losing weight, if needed.  Changing your diet.  Not smoking.  Using a machine to open your airway while you sleep, such as: ? An oral appliance. This is a mouthpiece that shifts your lower jaw forward. ? A CPAP device. This device blows air through a mask when you breathe out (exhale). ? An EPAP device. This has valves that you put in each nostril. ? A BPAP device. This device blows air through a mask when you breathe in (inhale) and breathe out.  Having surgery if other treatments do not work. It is important to get treatment   for sleep apnea. Without treatment, it can lead to:  High blood pressure.  Coronary artery disease.  In men, not being able to have an erection (impotence).  Reduced thinking ability.   Follow these instructions at home: Lifestyle  Make changes that your doctor recommends.  Eat a healthy diet.  Lose weight if needed.  Avoid alcohol, medicines to help you relax, and some pain medicines.  Do not use any  products that contain nicotine or tobacco, such as cigarettes, e-cigarettes, and chewing tobacco. If you need help quitting, ask your doctor. General instructions  Take over-the-counter and prescription medicines only as told by your doctor.  If you were given a machine to use while you sleep, use it only as told by your doctor.  If you are having surgery, make sure to tell your doctor you have sleep apnea. You may need to bring your device with you.  Keep all follow-up visits as told by your doctor. This is important. Contact a doctor if:  The machine that you were given to use during sleep bothers you or does not seem to be working.  You do not get better.  You get worse. Get help right away if:  Your chest hurts.  You have trouble breathing in enough air.  You have an uncomfortable feeling in your back, arms, or stomach.  You have trouble talking.  One side of your body feels weak.  A part of your face is hanging down. These symptoms may be an emergency. Do not wait to see if the symptoms will go away. Get medical help right away. Call your local emergency services (911 in the [place] Do not drive yourself to the hospital. Summary  This condition affects breathing during sleep.  The most common cause is a collapsed or blocked airway.  The goal of treatment is to help you breathe normally while you sleep. This information is not intended to replace advice given to you by your health care provider. Make sure you discuss any questions you have with your health care provider. Document Revised: 02/05/2018 Document Reviewed: 12/15/2017 [employer] Patient Education  2021 [employer]  

## 2020-09-11 NOTE — Progress Notes (Deleted)
PATIENT: Wendy Leblanc DOB: 01-18-1961  REASON FOR VISIT: follow up HISTORY FROM: patient  No chief complaint on file.    HISTORY OF PRESENT ILLNESS:  09/11/20 ALL:  Wendy Leblanc returns for follow up for OSA on CPAP. Wendy Leblanc continues to struggle with compliance. Wendy Leblanc has not used CPAP since 08/2020. 30 day report from 07/10/20-08/08/20 below.      05/16/2020 ALL:  Wendy Leblanc returns today for CPAP follow up.  Unfortunately, Wendy Leblanc has not used CPAP over the past 30 days.  Wendy Leblanc reports that Wendy Leblanc has had more difficulty with asthma exacerbations.  Wendy Leblanc is using albuterol and Advair as prescribed.  PCP recently ordered nebulizers.  Wendy Leblanc has scheduled follow-up with PCP tomorrow.  Blood pressure has been elevated due to discontinuation of blood pressure medicines.  Wendy Leblanc reports having a reaction with last prescription but is never had any concerns with blood pressure medication in the past.  Fortunately, Wendy Leblanc is asymptomatic today.  Wendy Leblanc will discuss with primary care tomorrow.  Neck pain is stable.  Wendy Leblanc has completed physical therapy.  Wendy Leblanc continues gabapentin and is doing well.  History (copied from previous notes) 02/07/2020 Wendy Leblanc is a 60 y.o. female here today for follow up for cervical stenosis and recently diagnosed OSA started on CPAP therapy.   Wendy Leblanc reports gabapentin has worked well for neck pain and dysesthesias. Wendy Leblanc is taking 300/300/600. No obvious adverse effects. No falls. Wendy Leblanc continues to use cane. Wendy Leblanc has participated in PT and feels this has been helpful. Wendy Leblanc has 12 more sessions.   Wendy Leblanc was diagnosed with moderate OSA. Wendy Leblanc reports doing well with CPAP therapy initially. Wendy Leblanc had cataract surgery, first on left eye then right, over the past month that has interrupted CPAP usage. Wendy Leblanc does note improvement in sleep quality and daytime energy when using CPAP consistently. No concerns with CPAP or supplies.   Compliance report dated 12/08/2019 through 02/05/2020 reveals that Wendy Leblanc used CPAP 25 of  the past 60 days for compliance of 42%.  Wendy Leblanc is CPAP greater than 4 hours 9 of the past 60 days for compliance of 15%.  Average usage on days used was 3 hours and 31 minutes.  Residual AHI was 2.6 on 5 to 15 cm of water and an EPR of 3.  There was no significant leak noted.  HISTORY: (copied from Dr Garth Bigness note on 09/30/2019)  I had the pleasure of seeing your patient, Wendy Leblanc, at [facility] for neurologic consultation regarding her carpal tunnel syndrome and gait disturbance.  Wendy Leblanc is a 60 year old woman who has had multiple falls due to her legs giving out after Wendy Leblanc walks a while. Wendy Leblanc has been using a cane since a possible TIA in September 2019.  Wendy Leblanc feels Wendy Leblanc is slowly worsening.   Wendy Leblanc has neck pain, worse with bending.   Wendy Leblanc notes pain radiates into shoulders and down her arms into all fingers but more on the thumb sie than 5th finger side.    Wendy Leblanc feels weak in both her arms and hands.   The right is weaker than the left now but in the past, the left was worse.    Wendy Leblanc has urinary frequency.   Wendy Leblanc has insomnia and wakes up every few hours (sometimes for pain and sometimes for bladder).    Wendy Leblanc had CTS and had a carpal tunnel release on the right.    Since Wendy Leblanc had no benefit Wendy Leblanc did not proceed with left CTR.  Vascular risks:  Hypertension, borderline DM, hyperlipidemia, smoking (1/3 ppd now but up to 1 ppd in the past, starting age 53).   Possible OSA  In September 2019 Wendy Leblanc had chest pain and syncope.   When Wendy Leblanc regained consciousness, Wendy Leblanc had left side weakness which quickly improved to baseline.   No stroke was found on CT or MRI and no significant arterial stenosis.     Wendy Leblanc also has snoring, EDS and witnessed OSA.   EPWORTH SLEEPINESS SCALE  On a scale of 0 - 3 what is the chance of dozing:  Sitting and Reading:                          3 Watching TV:                                      3 Sitting inactive in a public place:       2 Passenger in car for one  hour:          3 Lying down to rest in the afternoon:  3 Sitting and talking to someone:         2 Sitting quietly after lunch:                  3 In a car, stopped in traffic:                 0 (does not snore)  Total (out of 24): 19/24  Severe sleepiness   I personally reviewed the MRI of the brain dated 01/24/2018.  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   Wendy Leblanc also had a CT angiogram of the neck 01/23/2018.  I personally reviewed the images.  There were no significant arterial issues.  Wendy Leblanc did have significant degenerative changes from C3-C7 with reduced disc height as well as uncovertebral spurring, mild facet hypertrophy and possibility of spinal stenosis at these levels.  There were no acute findings.  I also reviewed recent lab work from 07/25/2019.  CBC CMP, lipid panel were unremarkable.  TSH was normal but hemoglobin A1c was minimally elevated at 5.8    REVIEW OF SYSTEMS: Out of a complete 14 system review of symptoms, the patient complains only of the following symptoms, neck pain, imbalance, numbness tinging, fatigue and all other reviewed systems are negative.  ESS:14   ALLERGIES: Allergies  Allergen Reactions  . Penicillins     ANAPHYLAXIS   . Albuterol Sulfate     HIVES  . Contrast Media [Iodinated Diagnostic Agents]     hives  . Elemental Sulfur     HIVES     HOME MEDICATIONS: Outpatient Medications Prior to Visit  Medication Sig Dispense Refill  . acetaminophen (TYLENOL) 500 MG tablet Take 500 mg by mouth every 6 (six) hours as needed.    Marland Kitchen albuterol (VENTOLIN HFA) 108 (90 Base) MCG/ACT inhaler     . amLODipine (NORVASC) 10 MG tablet Take 10 mg by mouth daily.  0  . aspirin EC 81 MG tablet Take 81 mg by mouth daily.    Marland Kitchen atorvastatin (LIPITOR) 20 MG tablet Take 20 mg by mouth daily.    . benzonatate (TESSALON) 100 MG capsule Take by mouth 3 (three) times daily as needed for cough.    . Cholecalciferol  (VITAMIN D3 PO) Take by mouth. 1000u  daily    . cyclobenzaprine (FLEXERIL) 5 MG tablet Take 5 mg by mouth 3 (three) times daily as needed.    Marland Kitchen EPINEPHrine 0.3 mg/0.3 mL IJ SOAJ injection Inject 0.3 mg into the muscle as needed for anaphylaxis. 1 each 0  . escitalopram (LEXAPRO) 10 MG tablet Take 10 mg by mouth daily.    . Fluticasone-Salmeterol (WIXELA INHUB) 500-50 MCG/DOSE AEPB Inhale 1 puff into the lungs 2 (two) times daily. 60 each 11  . gabapentin (NEURONTIN) 300 MG capsule TAKE 1 CAPSULE BY MOUTH DAILY IN THE MORNING, 1 TABLET DAILY IN THE EVENING AND 2 TABLETS AT BEDTIME 120 capsule 5  . hydrALAZINE (APRESOLINE) 10 MG tablet Take 10 mg by mouth 4 (four) times daily.    Marland Kitchen HYDROcodone-acetaminophen (NORCO/VICODIN) 5-325 MG tablet Take 1 tablet by mouth every 6 (six) hours as needed. 10 tablet 0  . imipramine (TOFRANIL) 25 MG tablet Take 1 tablet (25 mg total) by mouth at bedtime. 30 tablet 5  . lidocaine (LIDODERM) 5 %     . lisinopril-hydrochlorothiazide (PRINZIDE,ZESTORETIC) 20-25 MG tablet Take 0.5 tablets by mouth daily.  0  . loratadine (CLARITIN) 10 MG tablet Take 1 tablet (10 mg total) by mouth daily as needed for allergies or itching (rash). 6 tablet 0  . montelukast (SINGULAIR) 10 MG tablet Take 1 tablet (10 mg total) by mouth at bedtime. 30 tablet 11  . nicotine (NICODERM CQ) 7 mg/24hr patch Place 1 patch (7 mg total) onto the skin daily. 28 patch 0  . omeprazole (PRILOSEC) 20 MG capsule Take 20 mg by mouth daily.    Marland Kitchen topiramate (TOPAMAX) 25 MG tablet Take 25 mg by mouth 2 (two) times daily.  0   No facility-administered medications prior to visit.    PAST MEDICAL HISTORY: Past Medical History:  Diagnosis Date  . Anxiety   . Arthritis   . Carpal tunnel syndrome   . Cataract   . CVA (cerebral vascular accident) ([place])   . Depression   . Gait abnormality   . Hyperlipidemia   . Hypertension   . Migraine   . Osteoarthritis   . Smoker   . Stroke [facility]) 2019    PAST  SURGICAL HISTORY: Past Surgical History:  Procedure Laterality Date  . ABDOMINAL HYSTERECTOMY    . CARPAL TUNNEL RELEASE    . hemorrhoid surgery    . TUBAL LIGATION      FAMILY HISTORY: Family History  Problem Relation Age of Onset  . Breast cancer Mother   . Heart disease Mother   . Breast cancer Sister   . Diabetes Brother   . Diabetes Sister   . Colon cancer Neg Hx   . Esophageal cancer Neg Hx   . Rectal cancer Neg Hx   . Stomach cancer Neg Hx   . Colon polyps Neg Hx     SOCIAL HISTORY: Social History   Socioeconomic History  . Marital status: Widowed    Spouse name: Not on file  . Number of children: 2  . Years of education: Not on file  . Highest education level: 12th grade  Occupational History  . Not on file  Tobacco Use  . Smoking status: Current Every Day Smoker    Packs/day: 0.50    Years: 45.00    Pack years: 22.50    Types: Cigarettes  . Smokeless tobacco: Never Used  . Tobacco comment: down to 1/4 ppd 06/15/20  Vaping Use  . Vaping Use: Former  Substance and  Sexual Activity  . Alcohol use: Not Currently  . Drug use: Never  . Sexual activity: Not on file  Other Topics Concern  . Not on file  Social History Narrative  . Not on file   Social Determinants of Health   Financial Resource Strain: Not on file  Food Insecurity: Not on file  Transportation Needs: Not on file  Physical Activity: Not on file  Stress: Not on file  Social Connections: Not on file  Intimate Partner Violence: Not on file      PHYSICAL EXAM  There were no vitals filed for this visit. There is no height or weight on file to calculate BMI.  Generalized: Well developed, in no acute distress  Cardiology: normal rate and rhythm, no murmur noted Respiratory: clear to auscultation bilaterally, no wheezing or rhonchi Neurological examination  Mentation: Alert oriented to time, place, history taking. Follows all commands speech and language fluent Cranial nerve II-XII:  Pupils were equal round reactive to light. Extraocular movements were full, visual field were full  Motor: The motor testing reveals 5 over 5 strength of all 4 extremities. Good symmetric motor tone is noted throughout.  Gait and station: Gait is stable with cane     DIAGNOSTIC DATA (LABS, IMAGING, TESTING) - I reviewed patient records, labs, notes, testing and imaging myself where available.  No flowsheet data found.   Lab Results  Component Value Date   WBC 5.2 01/25/2018   HGB 11.8 (L) 01/25/2018   HCT 37.9 01/25/2018   MCV 90.9 01/25/2018   PLT 185 01/25/2018      Component Value Date/Time   NA 143 01/17/2020 0812   K 3.7 01/17/2020 0812   CL 101 01/17/2020 0812   CO2 28 01/17/2020 0812   GLUCOSE 86 01/17/2020 0812   GLUCOSE 91 01/25/2018 0553   BUN 16 01/17/2020 0812   CREATININE 0.96 01/17/2020 0812   CALCIUM 9.1 01/17/2020 0812   PROT 7.2 01/23/2018 1648   ALBUMIN 4.2 01/23/2018 1648   AST 39 01/23/2018 1648   ALT 14 01/23/2018 1648   ALKPHOS 85 01/23/2018 1648   BILITOT 0.8 01/23/2018 1648   GFRNONAA 64 01/17/2020 0812   GFRAA 74 01/17/2020 0812   Lab Results  Component Value Date   CHOL 115 01/24/2018   HDL 51 01/24/2018   LDLCALC 58 01/24/2018   TRIG 32 01/24/2018   CHOLHDL 2.3 01/24/2018   Lab Results  Component Value Date   HGBA1C 5.7 (H) 01/24/2018   No results found for: VITAMINB12 No results found for: TSH     ASSESSMENT AND PLAN 60 y.o. year old female  has a past medical history of Anxiety, Arthritis, Carpal tunnel syndrome, Cataract, CVA (cerebral vascular accident) ([place]), Depression, Gait abnormality, Hyperlipidemia, Hypertension, Migraine, Osteoarthritis, Smoker, and Stroke ([place]) (2019). here with     ICD-10-CM   1. OSA on CPAP  G47.33    Z99.89      Yagmur has had difficulty with asthma exacerbation over the past 30 days and has been unable to use CPAP therapy.  Review of 90-day report shows 32% daily compliance and 16% 4-hour  compliance.  I have reviewed risk of untreated sleep apnea.  We have discussed options and management of sleep apnea.  Wendy Leblanc does report feeling better rested when using CPAP consistently.  Wendy Leblanc wishes to continue CPAP therapy.  Wendy Leblanc has a scheduled follow-up with primary care tomorrow to address blood pressure and asthma concerns.  Wendy Leblanc was encouraged to resume CPAP as  soon as Wendy Leblanc is able.  I will repeat download in 6 weeks to assess compliance.  Wendy Leblanc will continue gabapentin for neck pain.  I will bring her back in for follow-up in 4 months.  Wendy Leblanc verbalizes understanding and agreement with this plan.   No orders of the defined types were placed in this encounter.    No orders of the defined types were placed in this encounter.     I spent 35 minutes with the patient. 50% of this time was spent counseling and educating patient on plan of care and medications.     Debbora Presto, FNP-C 09/11/2020, 1:16 PM [facility] [address] [place], [place] [address] [phone]

## 2020-09-13 ENCOUNTER — Ambulatory Visit: Payer: Medicare HMO | Admitting: Family Medicine

## 2020-09-13 DIAGNOSIS — G4733 Obstructive sleep apnea (adult) (pediatric): Secondary | ICD-10-CM

## 2020-09-20 ENCOUNTER — Other Ambulatory Visit: Payer: Self-pay

## 2020-09-20 ENCOUNTER — Encounter (HOSPITAL_BASED_OUTPATIENT_CLINIC_OR_DEPARTMENT_OTHER): Payer: Self-pay | Admitting: Emergency Medicine

## 2020-09-20 ENCOUNTER — Emergency Department (HOSPITAL_BASED_OUTPATIENT_CLINIC_OR_DEPARTMENT_OTHER): Payer: Medicare HMO

## 2020-09-20 ENCOUNTER — Emergency Department (HOSPITAL_BASED_OUTPATIENT_CLINIC_OR_DEPARTMENT_OTHER)
Admission: EM | Admit: 2020-09-20 | Discharge: 2020-09-20 | Disposition: A | Discharge status: Home / Self Care | Payer: Medicare HMO | Attending: Emergency Medicine | Admitting: Emergency Medicine

## 2020-09-20 DIAGNOSIS — Z7982 Long term (current) use of aspirin: Secondary | ICD-10-CM | POA: Insufficient documentation

## 2020-09-20 DIAGNOSIS — Z79899 Other long term (current) drug therapy: Secondary | ICD-10-CM | POA: Diagnosis not present

## 2020-09-20 DIAGNOSIS — J4541 Moderate persistent asthma with (acute) exacerbation: Secondary | ICD-10-CM | POA: Diagnosis not present

## 2020-09-20 DIAGNOSIS — Z8673 Personal history of transient ischemic attack (TIA), and cerebral infarction without residual deficits: Secondary | ICD-10-CM | POA: Diagnosis not present

## 2020-09-20 DIAGNOSIS — I10 Essential (primary) hypertension: Secondary | ICD-10-CM | POA: Insufficient documentation

## 2020-09-20 DIAGNOSIS — F17210 Nicotine dependence, cigarettes, uncomplicated: Secondary | ICD-10-CM | POA: Insufficient documentation

## 2020-09-20 DIAGNOSIS — R0602 Shortness of breath: Secondary | ICD-10-CM | POA: Diagnosis present

## 2020-09-20 LAB — COMPREHENSIVE METABOLIC PANEL
ALT: 13 U/L (ref 0–44)
AST: 23 U/L (ref 15–41)
Albumin: 4.2 g/dL (ref 3.5–5.0)
Alkaline Phosphatase: 117 U/L (ref 38–126)
Anion gap: 10 (ref 5–15)
BUN: 12 mg/dL (ref 6–20)
CO2: 28 mmol/L (ref 22–32)
Calcium: 9.5 mg/dL (ref 8.9–10.3)
Chloride: 103 mmol/L (ref 98–111)
Creatinine, Ser: 0.96 mg/dL (ref 0.44–1.00)
GFR, Estimated: >60 mL/min (ref >60)
Glucose, Bld: 105 mg/dL — ABNORMAL HIGH (ref 70–99)
Potassium: 4.3 mmol/L (ref 3.5–5.1)
Sodium: 141 mmol/L (ref 135–145)
Total Bilirubin: 0.3 mg/dL (ref 0.3–1.2)
Total Protein: 7.7 g/dL (ref 6.5–8.1)

## 2020-09-20 LAB — CBC WITH DIFFERENTIAL/PLATELET
Abs Immature Granulocytes: 0.01 10*3/uL (ref 0.00–0.07)
Basophils Absolute: 0 10*3/uL (ref 0.0–0.1)
Basophils Relative: 0 %
Eosinophils Absolute: 0.4 10*3/uL (ref 0.0–0.5)
Eosinophils Relative: 7 %
HCT: 46.9 % — ABNORMAL HIGH (ref 36.0–46.0)
Hemoglobin: 14.9 g/dL (ref 12.0–15.0)
Immature Granulocytes: 0 %
Lymphocytes Relative: 28 %
Lymphs Abs: 1.7 10*3/uL (ref 0.7–4.0)
MCH: 28.9 pg (ref 26.0–34.0)
MCHC: 31.8 g/dL (ref 30.0–36.0)
MCV: 91.1 fL (ref 80.0–100.0)
Monocytes Absolute: 0.4 10*3/uL (ref 0.1–1.0)
Monocytes Relative: 6 %
Neutro Abs: 3.6 10*3/uL (ref 1.7–7.7)
Neutrophils Relative %: 59 %
Platelets: 207 10*3/uL (ref 150–400)
RBC: 5.15 MIL/uL — ABNORMAL HIGH (ref 3.87–5.11)
RDW: 14.3 % (ref 11.5–15.5)
WBC: 6 10*3/uL (ref 4.0–10.5)
nRBC: 0 % (ref 0.0–0.2)

## 2020-09-20 LAB — TROPONIN I (HIGH SENSITIVITY): Troponin I (High Sensitivity): <2 ng/L (ref <18)

## 2020-09-20 LAB — BRAIN NATRIURETIC PEPTIDE: B Natriuretic Peptide: 33.5 pg/mL (ref 0.0–100.0)

## 2020-09-20 MED ORDER — IPRATROPIUM-ALBUTEROL 0.5-2.5 (3) MG/3ML IN SOLN
3.0000 mL | Freq: Once | RESPIRATORY_TRACT | Status: AC
  Administered 2020-09-20: 3 mL via RESPIRATORY_TRACT
  Filled 2020-09-20: qty 3

## 2020-09-20 MED ORDER — PREDNISONE 10 MG PO TABS: 40.0000 mg | ORAL_TABLET | Freq: Every day | ORAL | 0 refills | Status: DC

## 2020-09-20 MED ORDER — METHYLPREDNISOLONE SODIUM SUCC 125 MG IJ SOLR
125.0000 mg | Freq: Once | INTRAMUSCULAR | Status: AC
  Administered 2020-09-20: 125 mg via INTRAVENOUS
  Filled 2020-09-20: qty 2

## 2020-09-20 NOTE — ED Notes (Signed)
RT assessed patient upon entering room. BBS exp wheezes, more so with exertion. Stated she has been using her MDI more the past 3 weeks. EKG obtained due to high HR and given to MD. SAT 95-98%. No acute distress noted.

## 2020-09-20 NOTE — ED Provider Notes (Signed)
[place] EMERGENCY DEPARTMENT Provider Note   CSN: [id number] Arrival date & time: 09/20/20  5170     History Chief Complaint  Patient presents with  . Shortness of Breath    Wendy Leblanc is a 60 y.o. female.  The history is provided by the patient and medical records.  Shortness of Breath  Wendy Leblanc is a 60 y.o. female who presents to the Emergency Department complaining of sob.  She presents to the ED complaining of wheezing and sob for the last three weeks.  She has been taking her inhaler with no relief.  No recent sick contacts.  Has been vaccinated for COVID 19.    Uses albuterol, wixela, singulair.    No fever. Has chest heaviness intermittently.  Has cough, occasionally productive of clear sputum.  No sore throat.  Occasional runny nose.  No abdominal pain. No leg swelling/pain.  Has nausea x 2-3 days.    No hx/o CHF, CAD, DVT/PE.      Past Medical History:  Diagnosis Date  . Anxiety   . Arthritis   . Carpal tunnel syndrome   . Cataract   . CVA (cerebral vascular accident) ([place])   . Depression   . Gait abnormality   . Hyperlipidemia   . Hypertension   . Migraine   . Osteoarthritis   . Smoker   . Stroke [facility]) 2019    Patient Active Problem List   Diagnosis Date Noted  . Cervical stenosis of spinal canal 12/06/2019  . Arm weakness 12/06/2019  . Carpal tunnel syndrome, bilateral 12/06/2019  . Dysesthesia 12/06/2019  . Neck pain 12/06/2019  . OSA (obstructive sleep apnea) 12/06/2019  . Hypercholesterolemia 11/27/2019  . Severe obesity (BMI 35.0-39.9) with comorbidity ([place]) 11/27/2019  . Smoking 11/27/2019  . Intermittent claudication ([place]) 11/27/2019  . TIA (transient ischemic attack) 01/24/2018  . Essential hypertension 01/24/2018  . Depression 01/24/2018    Past Surgical History:  Procedure Laterality Date  . ABDOMINAL HYSTERECTOMY    . CARPAL TUNNEL RELEASE    . hemorrhoid surgery    . TUBAL LIGATION       OB  History    Gravida  3   Para      Term      Preterm      AB  1   Living  2     SAB  1   IAB      Ectopic      Multiple      Live Births  2           Family History  Problem Relation Age of Onset  . Breast cancer Mother   . Heart disease Mother   . Breast cancer Sister   . Diabetes Brother   . Diabetes Sister   . Colon cancer Neg Hx   . Esophageal cancer Neg Hx   . Rectal cancer Neg Hx   . Stomach cancer Neg Hx   . Colon polyps Neg Hx     Social History   Tobacco Use  . Smoking status: Current Every Day Smoker    Packs/day: 0.50    Years: 45.00    Pack years: 22.50    Types: Cigarettes  . Smokeless tobacco: Never Used  . Tobacco comment: down to 1/4 ppd 06/15/20  Vaping Use  . Vaping Use: Former  Substance Use Topics  . Alcohol use: Not Currently  . Drug use: Never    Home Medications Prior to Admission  medications   Medication Sig Start Date End Date Taking? Authorizing Provider  predniSONE (DELTASONE) 10 MG tablet Take 4 tablets (40 mg total) by mouth daily. 09/20/20  Yes Quintella Reichert, MD  acetaminophen (TYLENOL) 500 MG tablet Take 500 mg by mouth every 6 (six) hours as needed.    [provider]  albuterol (VENTOLIN HFA) 108 (90 Base) MCG/ACT inhaler  02/05/20   [provider]  amLODipine (NORVASC) 10 MG tablet Take 10 mg by mouth daily. 12/29/17   [provider]  aspirin EC 81 MG tablet Take 81 mg by mouth daily.    [provider]  atorvastatin (LIPITOR) 20 MG tablet Take 20 mg by mouth daily.    [provider]  benzonatate (TESSALON) 100 MG capsule Take by mouth 3 (three) times daily as needed for cough.    [provider]  Cholecalciferol (VITAMIN D3 PO) Take by mouth. 1000u daily    [provider]  cyclobenzaprine (FLEXERIL) 5 MG tablet Take 5 mg by mouth 3 (three) times daily as needed. 04/03/19   [provider]  EPINEPHrine 0.3 mg/0.3 mL IJ SOAJ injection Inject  0.3 mg into the muscle as needed for anaphylaxis. 04/11/20   Luna Fuse, MD  escitalopram (LEXAPRO) 10 MG tablet Take 10 mg by mouth daily.    [provider]  Fluticasone-Salmeterol (WIXELA INHUB) 500-50 MCG/DOSE AEPB Inhale 1 puff into the lungs 2 (two) times daily. 06/15/20   Freddi Starr, MD  gabapentin (NEURONTIN) 300 MG capsule TAKE 1 CAPSULE BY MOUTH DAILY IN THE MORNING, 1 TABLET DAILY IN THE EVENING AND 2 TABLETS AT BEDTIME 08/23/20   Lomax, Amy, NP  hydrALAZINE (APRESOLINE) 10 MG tablet Take 10 mg by mouth 4 (four) times daily.    [provider]  HYDROcodone-acetaminophen (NORCO/VICODIN) 5-325 MG tablet Take 1 tablet by mouth every 6 (six) hours as needed. 08/23/20   Valarie Merino, MD  imipramine (TOFRANIL) 25 MG tablet Take 1 tablet (25 mg total) by mouth at bedtime. 09/30/19   Sater, Nanine Means, MD  lidocaine (LIDODERM) 5 %  02/13/20   [provider]  lisinopril-hydrochlorothiazide (PRINZIDE,ZESTORETIC) 20-25 MG tablet Take 0.5 tablets by mouth daily. 12/29/17   [provider]  loratadine (CLARITIN) 10 MG tablet Take 1 tablet (10 mg total) by mouth daily as needed for allergies or itching (rash). 04/11/20   Luna Fuse, MD  montelukast (SINGULAIR) 10 MG tablet Take 1 tablet (10 mg total) by mouth at bedtime. 06/15/20   Freddi Starr, MD  nicotine (NICODERM CQ) 7 mg/24hr patch Place 1 patch (7 mg total) onto the skin daily. 03/13/20   Freddi Starr, MD  omeprazole (PRILOSEC) 20 MG capsule Take 20 mg by mouth daily.    [provider]  topiramate (TOPAMAX) 25 MG tablet Take 25 mg by mouth 2 (two) times daily. 12/29/17   [provider]    Allergies    Penicillins, Contrast media [iodinated diagnostic agents], and Elemental sulfur  Review of Systems   Review of Systems  Respiratory: Positive for shortness of breath.   All other systems reviewed and are negative.   Physical Exam Updated Vital Signs BP 122/64    Pulse 73   Temp 98.3 F (36.8 C) (Oral)   Resp 20   Ht 5\' 3"  (1.6 m)   Wt 93 kg   SpO2 94%   BMI 36.31 kg/m   Physical Exam Vitals and nursing note reviewed.  Constitutional:  Appearance: She is well-developed.  HENT:     Head: Normocephalic and atraumatic.  Cardiovascular:     Rate and Rhythm: Normal rate and regular rhythm.     Heart sounds: No murmur heard.   Pulmonary:     Effort: Pulmonary effort is normal. No respiratory distress.     Comments: Tachypnea.  Diffuse wheezes, occasional crackles in left lung fields.   Abdominal:     Palpations: Abdomen is soft.     Tenderness: There is no abdominal tenderness. There is no guarding or rebound.  Musculoskeletal:        General: No swelling or tenderness.  Skin:    General: Skin is warm and dry.  Neurological:     Mental Status: She is alert and oriented to person, place, and time.  Psychiatric:        Behavior: Behavior normal.     ED Results / Procedures / Treatments   Labs (all labs ordered are listed, but only abnormal results are displayed) Labs Reviewed  COMPREHENSIVE METABOLIC PANEL - Abnormal; Notable for the following components:      Result Value   Glucose, Bld 105 (*)    All other components within normal limits  CBC WITH DIFFERENTIAL/PLATELET - Abnormal; Notable for the following components:   RBC 5.15 (*)    HCT 46.9 (*)    All other components within normal limits  BRAIN NATRIURETIC PEPTIDE  TROPONIN I (HIGH SENSITIVITY)    EKG EKG Interpretation  Date/Time:  Thursday Sep 20 2020 08:50:21 EDT Ventricular Rate:  97 PR Interval:  124 QRS Duration: 77 QT Interval:  357 QTC Calculation: 454 R Axis:   65 Text Interpretation: Sinus rhythm Confirmed by Quintella Reichert [id number]) on 09/20/2020 8:52:18 AM   Radiology DG Chest Port 1 View  Result Date: 09/20/2020 CLINICAL DATA:  60 year old female with persistent shortness of breath for 3 weeks. EXAM: PORTABLE CHEST 1 VIEW COMPARISON:  Chest  CT 04/18/2020 and earlier. FINDINGS: Portable AP semi upright view at 0913 hours. Lung volumes and mediastinal contours are within normal limits. Allowing for portable technique the lungs are clear. No pneumothorax or pleural effusion. Visualized tracheal air column is within normal limits. No osseous abnormality identified. Negative visible bowel gas pattern. IMPRESSION: Negative portable chest. Electronically Signed   By: Genevie Ann M.D.   On: 09/20/2020 09:55    Procedures Procedures   Medications Ordered in ED Medications  methylPREDNISolone sodium succinate (SOLU-MEDROL) 125 mg/2 mL injection 125 mg (125 mg Intravenous Given 09/20/20 1000)  ipratropium-albuterol (DUONEB) 0.5-2.5 (3) MG/3ML nebulizer solution 3 mL (3 mLs Nebulization Given 09/20/20 0914)    ED Course  I have reviewed the triage vital signs and the nursing notes.  Pertinent labs & imaging results that were available during my care of the patient were reviewed by me and considered in my medical decision making (see chart for details).    MDM Rules/Calculators/A&P                         patient with history of asthma here for evaluation of increased shortness of breath and wheezing for three weeks. On initial assessment patient with wheezing bilaterally. She was treated with Solu-Medrol, albuterol. On repeat assessment patient feeling significantly improved, good air movement bilaterally with only occasional wheezing. She is able to ambulate without difficulty in the department. Presentation is not consistent with ACS, PE, CHF, pneumonia. Discussed with patient and sister home care for asthma exacerbation with  outpatient follow-up and return precautions.    Final Clinical Impression(s) / ED Diagnoses Final diagnoses:  Moderate persistent asthma with exacerbation    Rx / DC Orders ED Discharge Orders         Ordered    predniSONE (DELTASONE) 10 MG tablet  Daily        09/20/20 1227           Quintella Reichert,  MD 09/20/20 1231

## 2020-09-20 NOTE — ED Triage Notes (Signed)
Patient states was diagnosed with asthma approximately one year ago. Has been taking nebulizer treatments at home. Wheezes noted in assessment.

## 2020-10-17 ENCOUNTER — Ambulatory Visit: Payer: Medicare HMO | Admitting: Family Medicine

## 2020-10-30 ENCOUNTER — Ambulatory Visit: Payer: Medicare HMO | Admitting: Pulmonary Disease

## 2020-11-01 ENCOUNTER — Other Ambulatory Visit: Payer: Self-pay

## 2020-11-01 ENCOUNTER — Ambulatory Visit (INDEPENDENT_AMBULATORY_CARE_PROVIDER_SITE_OTHER): Payer: Medicare HMO | Admitting: Pulmonary Disease

## 2020-11-01 ENCOUNTER — Telehealth: Payer: Self-pay | Admitting: Pulmonary Disease

## 2020-11-01 ENCOUNTER — Encounter: Payer: Self-pay | Admitting: Pulmonary Disease

## 2020-11-01 VITALS — BP 122/78 | HR 77 | Wt 208.0 lb

## 2020-11-01 DIAGNOSIS — J4541 Moderate persistent asthma with (acute) exacerbation: Secondary | ICD-10-CM

## 2020-11-01 MED ORDER — TRELEGY ELLIPTA 200-62.5-25 MCG/INH IN AEPB: 1.0000 | INHALATION_SPRAY | Freq: Every day | RESPIRATORY_TRACT | 3 refills | Status: DC

## 2020-11-01 MED ORDER — TRELEGY ELLIPTA 200-62.5-25 MCG/INH IN AEPB: 1.0000 | INHALATION_SPRAY | Freq: Every day | RESPIRATORY_TRACT | 6 refills | Status: AC

## 2020-11-01 MED ORDER — TRELEGY ELLIPTA 200-62.5-25 MCG/INH IN AEPB: 1.0000 | INHALATION_SPRAY | Freq: Every day | RESPIRATORY_TRACT | 0 refills | Status: DC

## 2020-11-01 MED ORDER — ALBUTEROL SULFATE (2.5 MG/3ML) 0.083% IN NEBU: 2.5000 mg | INHALATION_SOLUTION | Freq: Four times a day (QID) | RESPIRATORY_TRACT | 12 refills | Status: AC | PRN

## 2020-11-01 MED ORDER — ALBUTEROL SULFATE HFA 108 (90 BASE) MCG/ACT IN AERS: 1.0000 | INHALATION_SPRAY | RESPIRATORY_TRACT | 11 refills | Status: AC | PRN

## 2020-11-01 NOTE — Patient Instructions (Addendum)
Stop the advair inhaler (purple inhaler) when starting Trelegy  Start trellegy ellipta 200, 1 puff daily  Continue to use albuterol inhaler as needed  Continue singulair daily

## 2020-11-01 NOTE — Progress Notes (Signed)
Synopsis: Referred by Vassie Moment, MD for abnormal chest imaging  Subjective:   PATIENT ID: Wendy Leblanc GENDER: female DOB: [birth year], MRN: [id number]  HPI  Chief Complaint  Patient presents with   Shortness of Breath    E.R. visit in May still having shortness of breath. Patient still have asthma complication. Wheezing and coughing at night.   Wendy Leblanc is a 60 year old woman, daily smoker with history of hypertension, hyperlipidemia and CVA who returns to pulmonary clinic for follow up of asthma.  She had an ER visit on 09/20/2020 for asthma exacerbation.  She was given methylprednisolone IV 125 mg and a DuoNeb nebulizer treatment with improvement in her breathing.  She was sent home on a steroid taper.  She reports her breathing is back to baseline currently.  She remains on Advair 250-50 MCG 1 puff twice daily and she did not receive the increased inhaler dose of 500-50 MCG 1 puff twice daily after the last visit as planned.  She has an albuterol rescue inhaler to be to use as needed.  She continues to smoke 3-5 cigarettes a day. Her daughter smokes in the home as well. She did not try nicotine patches after last visit as they were not covered by insurance.   She remains on CPAP at night. Continues to complain of fatigue.   She had CT chest scan in 04/2020 that showed resolution of the ground glass opacities in the left lower lobe noted on her CT coronary scan previously. No other concerning nodules noted.    PFTs on 06/13/20 are within normal limits. No obstructive or restrictive defects noted. No significant bronchodilator response (currently on wixella) and no diffusion defect.  Past Medical History:  Diagnosis Date   Anxiety    Arthritis    Carpal tunnel syndrome    Cataract    CVA (cerebral vascular accident) ([place])    Depression    Gait abnormality    Hyperlipidemia    Hypertension    Migraine    Osteoarthritis    Smoker    Stroke ([place]) 2019     Family  History  Problem Relation Age of Onset   Breast cancer Mother    Heart disease Mother    Breast cancer Sister    Diabetes Brother    Diabetes Sister    Colon cancer Neg Hx    Esophageal cancer Neg Hx    Rectal cancer Neg Hx    Stomach cancer Neg Hx    Colon polyps Neg Hx      Social History   Socioeconomic History   Marital status: Widowed    Spouse name: Not on file   Number of children: 2   Years of education: Not on file   Highest education level: 12th grade  Occupational History   Not on file  Tobacco Use   Smoking status: Every Day    Packs/day: 0.50    Years: 45.00    Pack years: 22.50    Types: Cigarettes   Smokeless tobacco: Never   Tobacco comments:    down to 1/4 ppd 06/15/20  Vaping Use   Vaping Use: Former  Substance and Sexual Activity   Alcohol use: Not Currently   Drug use: Never   Sexual activity: Not on file  Other Topics Concern   Not on file  Social History Narrative   Not on file   Social Determinants of Health   Financial Resource Strain: Not on file  Food Insecurity:  Not on file  Transportation Needs: Not on file  Physical Activity: Not on file  Stress: Not on file  Social Connections: Not on file  Intimate Partner Violence: Not on file     Allergies  Allergen Reactions   Penicillins     ANAPHYLAXIS    Contrast Media [Iodinated Diagnostic Agents]     hives   Elemental Sulfur     HIVES      Outpatient Medications Prior to Visit  Medication Sig Dispense Refill   acetaminophen (TYLENOL) 500 MG tablet Take 500 mg by mouth every 6 (six) hours as needed.     amLODipine (NORVASC) 10 MG tablet Take 10 mg by mouth daily.  0   aspirin EC 81 MG tablet Take 81 mg by mouth daily.     atorvastatin (LIPITOR) 20 MG tablet Take 20 mg by mouth daily.     benzonatate (TESSALON) 100 MG capsule Take by mouth 3 (three) times daily as needed for cough.     Cholecalciferol (VITAMIN D3 PO) Take by mouth. 1000u daily     cyclobenzaprine (FLEXERIL)  5 MG tablet Take 5 mg by mouth 3 (three) times daily as needed.     EPINEPHrine 0.3 mg/0.3 mL IJ SOAJ injection Inject 0.3 mg into the muscle as needed for anaphylaxis. 1 each 0   escitalopram (LEXAPRO) 10 MG tablet Take 10 mg by mouth daily.     gabapentin (NEURONTIN) 300 MG capsule TAKE 1 CAPSULE BY MOUTH DAILY IN THE MORNING, 1 TABLET DAILY IN THE EVENING AND 2 TABLETS AT BEDTIME 120 capsule 5   hydrALAZINE (APRESOLINE) 10 MG tablet Take 10 mg by mouth 4 (four) times daily.     HYDROcodone-acetaminophen (NORCO/VICODIN) 5-325 MG tablet Take 1 tablet by mouth every 6 (six) hours as needed. 10 tablet 0   imipramine (TOFRANIL) 25 MG tablet Take 1 tablet (25 mg total) by mouth at bedtime. 30 tablet 5   lidocaine (LIDODERM) 5 %      lisinopril-hydrochlorothiazide (PRINZIDE,ZESTORETIC) 20-25 MG tablet Take 0.5 tablets by mouth daily.  0   loratadine (CLARITIN) 10 MG tablet Take 1 tablet (10 mg total) by mouth daily as needed for allergies or itching (rash). 6 tablet 0   montelukast (SINGULAIR) 10 MG tablet Take 1 tablet (10 mg total) by mouth at bedtime. 30 tablet 11   nicotine (NICODERM CQ) 7 mg/24hr patch Place 1 patch (7 mg total) onto the skin daily. 28 patch 0   omeprazole (PRILOSEC) 20 MG capsule Take 20 mg by mouth daily.     predniSONE (DELTASONE) 10 MG tablet Take 4 tablets (40 mg total) by mouth daily. 20 tablet 0   topiramate (TOPAMAX) 25 MG tablet Take 25 mg by mouth 2 (two) times daily.  0   albuterol (VENTOLIN HFA) 108 (90 Base) MCG/ACT inhaler      Fluticasone-Salmeterol (WIXELA INHUB) 500-50 MCG/DOSE AEPB Inhale 1 puff into the lungs 2 (two) times daily. 60 each 11   No facility-administered medications prior to visit.    Review of Systems  Constitutional:  Positive for malaise/fatigue. Negative for chills, fever and weight loss.  HENT:  Negative for congestion, sinus pain and sore throat.   Eyes: Negative.   Respiratory:  Negative for cough, hemoptysis, sputum production,  shortness of breath and wheezing.   Cardiovascular:  Negative for chest pain, palpitations, orthopnea, claudication and leg swelling.  Gastrointestinal:  Negative for abdominal pain, heartburn, nausea and vomiting.  Genitourinary: Negative.   Musculoskeletal:  Negative for  joint pain and myalgias.  Skin:  Negative for rash.  Neurological:  Negative for weakness.  Endo/Heme/Allergies: Negative.   Psychiatric/Behavioral: Negative.     Objective:   Vitals:   11/01/20 1117  BP: 122/78  Pulse: 77  SpO2: 99%  Weight: 208 lb (94.3 kg)     Physical Exam Constitutional:      General: She is not in acute distress.    Appearance: She is not ill-appearing.  HENT:     Head: Normocephalic and atraumatic.  Eyes:     General: No scleral icterus.    Conjunctiva/sclera: Conjunctivae normal.     Pupils: Pupils are equal, round, and reactive to light.  Cardiovascular:     Rate and Rhythm: Normal rate and regular rhythm.     Pulses: Normal pulses.     Heart sounds: Normal heart sounds. No murmur heard. Pulmonary:     Effort: Pulmonary effort is normal.     Breath sounds: Normal breath sounds. No wheezing, rhonchi or rales.  Abdominal:     General: Bowel sounds are normal.     Palpations: Abdomen is soft.  Musculoskeletal:     Right lower leg: No edema.     Left lower leg: No edema.  Lymphadenopathy:     Cervical: No cervical adenopathy.  Skin:    General: Skin is warm and dry.  Neurological:     General: No focal deficit present.     Mental Status: She is alert.  Psychiatric:        Mood and Affect: Mood normal.        Behavior: Behavior normal.        Thought Content: Thought content normal.        Judgment: Judgment normal.    CBC    Component Value Date/Time   WBC 6.0 09/20/2020 0944   RBC 5.15 (H) 09/20/2020 0944   HGB 14.9 09/20/2020 0944   HCT 46.9 (H) 09/20/2020 0944   PLT 207 09/20/2020 0944   MCV 91.1 09/20/2020 0944   MCH 28.9 09/20/2020 0944   MCHC 31.8  09/20/2020 0944   RDW 14.3 09/20/2020 0944   LYMPHSABS 1.7 09/20/2020 0944   MONOABS 0.4 09/20/2020 0944   EOSABS 0.4 09/20/2020 0944   BASOSABS 0.0 09/20/2020 0944   Chest imaging: 04/18/20 CT Chest wo contrast Mediastinum/Nodes: No enlarged mediastinal or axillary lymph nodes. Thyroid gland, trachea, and esophagus demonstrate no significant findings.   Lungs/Pleura: Previously noted left basilar pulmonary infiltrate has resolved. The lungs are clear. No focal pulmonary nodule or infiltrate. No pneumothorax or pleural effusion. Central airways are widely patent.  01/20/20 CT Coronary Ground-glass attenuation in the basal segments of the left lower lobe. Some septal thickening in the periphery of the right lower lobe. Within the visualized portions of the thorax there are no suspicious appearing pulmonary nodules or masses, there are no pleural effusions, no pneumothorax and no lymphadenopathy. Visualized portions of the upper abdomen are unremarkable. There are no aggressive appearing lytic or blastic lesions noted in the visualized portions of the skeleton.  CXR 09/09/2019 The heart size and mediastinal contours are within normal limits. Both lungs are clear. No pneumothorax or pleural effusion is noted. The visualized skeletal structures are unremarkable.  PFT: PFT Results Latest Ref Rng & Units 06/13/2020  FVC-Pre L 2.14  FVC-Predicted Pre % 84  FVC-Post L 2.16  FVC-Predicted Post % 84  Pre FEV1/FVC % % 75  Post FEV1/FCV % % 73  FEV1-Pre L 1.60  FEV1-Predicted  Pre % 79  FEV1-Post L 1.58  DLCO uncorrected ml/min/mmHg 20.39  DLCO UNC% % 104  DLCO corrected ml/min/mmHg 20.39  DLCO COR %Predicted % 104  DLVA Predicted % 115    Echo: 01/24/2018 - Left ventricle: The cavity size was normal. Wall thickness was    normal. Systolic function was normal. The estimated ejection    fraction was in the range of 60% to 65%. Left ventricular    diastolic function parameters were  normal.     Assessment & Plan:   Moderate persistent asthma with acute exacerbation - Plan: Fluticasone-Umeclidin-Vilant (TRELEGY ELLIPTA) 200-62.5-25 MCG/INH AEPB, albuterol (VENTOLIN HFA) 108 (90 Base) MCG/ACT inhaler, Fluticasone-Umeclidin-Vilant (TRELEGY ELLIPTA) 200-62.5-25 MCG/INH AEPB, DISCONTINUED: Fluticasone-Umeclidin-Vilant (TRELEGY ELLIPTA) 200-62.5-25 MCG/INH AEPB  Discussion: Wendy Leblanc is a 60 year old woman, daily smoker with history of hypertension, hyperlipidemia and CVA who returns to pulmonary clinic for follow up of asthma.  She has moderate persistent asthma and recently had an exacerbation requiring an ER visit last month.  We will change her maintenance inhaler therapy from Advair 250-50 MCG to Trelegy Ellipta 200 1 puff daily.   She can continue to use albuterol as needed.  She is to continue montelukast 10 mg daily.  We will continue to work on smoking cessation in the future.  She is to follow up in 6 months.  Freda Jackson, MD [facility] Office: [phone]    Current Outpatient Medications:    acetaminophen (TYLENOL) 500 MG tablet, Take 500 mg by mouth every 6 (six) hours as needed., Disp: , Rfl:    albuterol (PROVENTIL) (2.5 MG/3ML) 0.083% nebulizer solution, Take 3 mLs (2.5 mg total) by nebulization every 6 (six) hours as needed for wheezing or shortness of breath., Disp: 75 mL, Rfl: 12   amLODipine (NORVASC) 10 MG tablet, Take 10 mg by mouth daily., Disp: , Rfl: 0   aspirin EC 81 MG tablet, Take 81 mg by mouth daily., Disp: , Rfl:    atorvastatin (LIPITOR) 20 MG tablet, Take 20 mg by mouth daily., Disp: , Rfl:    benzonatate (TESSALON) 100 MG capsule, Take by mouth 3 (three) times daily as needed for cough., Disp: , Rfl:    Cholecalciferol (VITAMIN D3 PO), Take by mouth. 1000u daily, Disp: , Rfl:    cyclobenzaprine (FLEXERIL) 5 MG tablet, Take 5 mg by mouth 3 (three) times daily as needed., Disp: , Rfl:    EPINEPHrine 0.3  mg/0.3 mL IJ SOAJ injection, Inject 0.3 mg into the muscle as needed for anaphylaxis., Disp: 1 each, Rfl: 0   escitalopram (LEXAPRO) 10 MG tablet, Take 10 mg by mouth daily., Disp: , Rfl:    Fluticasone-Umeclidin-Vilant (TRELEGY ELLIPTA) 200-62.5-25 MCG/INH AEPB, Inhale 1 puff into the lungs daily., Disp: 60 each, Rfl: 6   gabapentin (NEURONTIN) 300 MG capsule, TAKE 1 CAPSULE BY MOUTH DAILY IN THE MORNING, 1 TABLET DAILY IN THE EVENING AND 2 TABLETS AT BEDTIME, Disp: 120 capsule, Rfl: 5   hydrALAZINE (APRESOLINE) 10 MG tablet, Take 10 mg by mouth 4 (four) times daily., Disp: , Rfl:    HYDROcodone-acetaminophen (NORCO/VICODIN) 5-325 MG tablet, Take 1 tablet by mouth every 6 (six) hours as needed., Disp: 10 tablet, Rfl: 0   imipramine (TOFRANIL) 25 MG tablet, Take 1 tablet (25 mg total) by mouth at bedtime., Disp: 30 tablet, Rfl: 5   lidocaine (LIDODERM) 5 %, , Disp: , Rfl:    lisinopril-hydrochlorothiazide (PRINZIDE,ZESTORETIC) 20-25 MG tablet, Take 0.5 tablets by mouth daily., Disp: ,  Rfl: 0   loratadine (CLARITIN) 10 MG tablet, Take 1 tablet (10 mg total) by mouth daily as needed for allergies or itching (rash)., Disp: 6 tablet, Rfl: 0   montelukast (SINGULAIR) 10 MG tablet, Take 1 tablet (10 mg total) by mouth at bedtime., Disp: 30 tablet, Rfl: 11   nicotine (NICODERM CQ) 7 mg/24hr patch, Place 1 patch (7 mg total) onto the skin daily., Disp: 28 patch, Rfl: 0   omeprazole (PRILOSEC) 20 MG capsule, Take 20 mg by mouth daily., Disp: , Rfl:    predniSONE (DELTASONE) 10 MG tablet, Take 4 tablets (40 mg total) by mouth daily., Disp: 20 tablet, Rfl: 0   topiramate (TOPAMAX) 25 MG tablet, Take 25 mg by mouth 2 (two) times daily., Disp: , Rfl: 0   albuterol (VENTOLIN HFA) 108 (90 Base) MCG/ACT inhaler, Inhale 1-2 puffs into the lungs every 4 (four) hours as needed for wheezing or shortness of breath., Disp: 8 g, Rfl: 11   Fluticasone-Umeclidin-Vilant (TRELEGY ELLIPTA) 200-62.5-25 MCG/INH AEPB, Inhale 1  puff into the lungs daily., Disp: 1 each, Rfl: 0

## 2020-11-01 NOTE — Telephone Encounter (Signed)
I called and spoke with Wendy Leblanc who needed clarification on dosage for Trelegy inhaler. Gave correct dosage, nothing further needed.

## 2020-12-11 ENCOUNTER — Other Ambulatory Visit: Payer: Self-pay | Admitting: Family Medicine

## 2020-12-11 DIAGNOSIS — Z1231 Encounter for screening mammogram for malignant neoplasm of breast: Secondary | ICD-10-CM

## 2021-01-31 ENCOUNTER — Ambulatory Visit: Payer: Medicare HMO

## 2021-02-07 ENCOUNTER — Telehealth: Payer: Self-pay | Admitting: Family Medicine

## 2021-02-07 NOTE — Telephone Encounter (Signed)
LVM & mychart msg sent requesting cb to r/s 10/11 appointment- Amy out.

## 2021-02-12 ENCOUNTER — Ambulatory Visit: Payer: Medicare HMO | Admitting: Family Medicine

## 2021-02-22 ENCOUNTER — Ambulatory Visit
Admission: RE | Admit: 2021-02-22 | Discharge: 2021-02-22 | Disposition: A | Discharge status: Home / Self Care | Payer: Medicare (Managed Care) | Source: Ambulatory Visit | Attending: Family Medicine | Admitting: Family Medicine

## 2021-02-22 ENCOUNTER — Other Ambulatory Visit: Payer: Self-pay

## 2021-02-22 DIAGNOSIS — Z1231 Encounter for screening mammogram for malignant neoplasm of breast: Secondary | ICD-10-CM

## 2021-08-20 ENCOUNTER — Other Ambulatory Visit: Payer: Self-pay | Admitting: Orthopedic Surgery

## 2021-08-27 ENCOUNTER — Telehealth: Payer: Self-pay | Admitting: Pulmonary Disease

## 2021-08-27 NOTE — Telephone Encounter (Signed)
ATC patient to get her scheduled for OV with Dr. Erin Fulling for surgical clearance, LMTCB ? ?When patient calls back please get her scheduled with Dr. Erin Fulling for OV ?

## 2021-09-04 ENCOUNTER — Ambulatory Visit: Payer: Medicare (Managed Care) | Admitting: Physician Assistant

## 2021-09-04 NOTE — Progress Notes (Unsigned)
?Cardiology Office Note:   ? ?Date:  09/04/2021  ? ?ID:  Wendy Leblanc, DOB 1960/09/18, MRN [id number] ? ?PCP:  Drue Flirt, MD ?  ?[facility] Providers ?Cardiologist:  Sanda Klein, MD { ?Click to update primary MD,subspecialty MD or APP then REFRESH:1}   ? ?Referring MD: Drue Flirt, MD  ? ?No chief complaint on file. ?*** ? ?History of Present Illness:   ? ?Wendy Leblanc is a 61 y.o. female with a hx of ongoing smoking, hypertension, hyperlipidemia, PAD, CVA, and family history of heart disease.  She had a syncopal episode followed by transient left-sided weakness in 2019.  No stroke was seen on MRI brain.  Carotid CTA negative for extracranial or intracranial obstruction.  Echocardiogram at that time with an LVEF of 60 to 65% and no major valvular abnormalities.  ABI screening with reduced ABI on the right at 0.7 and normal on the left.  Follow-up arterial ultrasound with normal arterial flow in both legs.  She was seen by Dr. Sallyanne Kuster 11/25/2019 with chest pain and underwent CT coronary which showed a calcium score of 0 and no evidence of CAD.  She had extensive groundglass attenuation in the left lower lobe.  She now follows with pulmonology.  She was last seen by PA Meng in 03/01/2020 via telemedicine.  She had no cardiac complaints and he recommended no further cardiac work-up. ? ?She now is pending hip replacement and presents for preop clearance.  ? ? ?EKG with  ? ? ?Hypertension ?10 mg hydralazine 4 times daily, lisinopril-HCTZ, 10 mg amlodipine ? ? ? ?Hyperlipidemia ?Continue Lipitor ?Followed by PCP ? ? ?Hx of CT coronary ?Coronary calcium score zero with no evidence of CAD ? ? ?Preoperative risk evaluation for MACE ?Given her risk factors, she has a 0.4%-0.9% (question hx of stroke) risk of MACE. While she is unable to complete 4.0 METS, she had a recent coronary CT study that is very reassuring.  ? ?Therefore, based on ACC/AHA guidelines, the patient would be at acceptable  risk for the planned procedure without further cardiovascular testing.  ? ?The patient was advised that if she develops new symptoms prior to surgery to contact our office to arrange for a follow-up visit, and she verbalized understanding. ? ? ? ? ? ?Past Medical History:  ?Diagnosis Date  ? Anxiety   ? Arthritis   ? Carpal tunnel syndrome   ? Cataract   ? CVA (cerebral vascular accident) [facility])   ? Depression   ? Gait abnormality   ? Hyperlipidemia   ? Hypertension   ? Migraine   ? Osteoarthritis   ? Smoker   ? Stroke [facility]) 2019  ? ? ?Past Surgical History:  ?Procedure Laterality Date  ? ABDOMINAL HYSTERECTOMY    ? CARPAL TUNNEL RELEASE    ? hemorrhoid surgery    ? TUBAL LIGATION    ? ? ?Current Medications: ?No outpatient medications have been marked as taking for the 09/04/21 encounter (Appointment) with Ledora Bottcher, [place].  ?  ? ?Allergies:   Penicillins, Contrast media [iodinated contrast media], and Elemental sulfur  ? ?Social History  ? ?Socioeconomic History  ? Marital status: Widowed  ?  Spouse name: Not on file  ? Number of children: 2  ? Years of education: Not on file  ? Highest education level: 12th grade  ?Occupational History  ? Not on file  ?Tobacco Use  ? Smoking status: Every Day  ?  Packs/day: 0.50  ?  Years: 45.00  ?  Pack years: 22.50  ?  Types: Cigarettes  ? Smokeless tobacco: Never  ? Tobacco comments:  ?  down to 1/4 ppd 06/15/20  ?Vaping Use  ? Vaping Use: Former  ?Substance and Sexual Activity  ? Alcohol use: Not Currently  ? Drug use: Never  ? Sexual activity: Not on file  ?Other Topics Concern  ? Not on file  ?Social History Narrative  ? Not on file  ? ?Social Determinants of Health  ? ?Financial Resource Strain: Not on file  ?Food Insecurity: Not on file  ?Transportation Needs: Not on file  ?Physical Activity: Not on file  ?Stress: Not on file  ?Social Connections: Not on file  ?  ? ?Family History: ?The patient's ***family history includes Breast cancer in her mother and sister;  Diabetes in her brother and sister; Heart disease in her mother. There is no history of Colon cancer, Esophageal cancer, Rectal cancer, Stomach cancer, or Colon polyps. ? ?ROS:   ?Please see the history of present illness.    ?*** All other systems reviewed and are negative. ? ?EKGs/Labs/Other Studies Reviewed:   ? ?The following studies were reviewed today: ?*** ? ?EKG:  EKG is *** ordered today.  The ekg ordered today demonstrates *** ? ?Recent Labs: ?09/20/2020: ALT 13; B Natriuretic Peptide 33.5; BUN 12; Creatinine, Ser 0.96; Hemoglobin 14.9; Platelets 207; Potassium 4.3; Sodium 141  ?Recent Lipid Panel ?   ?Component Value Date/Time  ? CHOL 115 01/24/2018 0918  ? TRIG 32 01/24/2018 0918  ? HDL 51 01/24/2018 0918  ? CHOLHDL 2.3 01/24/2018 0918  ? VLDL 6 01/24/2018 0918  ? [place] 58 01/24/2018 0918  ? ? ? ?Risk Assessment/Calculations:   ?{Does this patient have ATRIAL FIBRILLATION?[phone]} ? ?    ? ?Physical Exam:   ? ?VS:  There were no vitals taken for this visit.   ? ?Wt Readings from Last 3 Encounters:  ?11/01/20 208 lb (94.3 kg)  ?09/20/20 205 lb (93 kg)  ?08/23/20 207 lb (93.9 kg)  ?  ? ?GEN: *** Well nourished, well developed in no acute distress ?HEENT: Normal ?NECK: No JVD; No carotid bruits ?LYMPHATICS: No lymphadenopathy ?CARDIAC: ***RRR, no murmurs, rubs, gallops ?RESPIRATORY:  Clear to auscultation without rales, wheezing or rhonchi  ?ABDOMEN: Soft, non-tender, non-distended ?MUSCULOSKELETAL:  No edema; No deformity  ?SKIN: Warm and dry ?NEUROLOGIC:  Alert and oriented x 3 ?PSYCHIATRIC:  Normal affect  ? ?ASSESSMENT:   ? ?No diagnosis found. ?PLAN:   ? ?In order of problems listed above: ? ?*** ? ?   ? ?{Are you ordering a CV Procedure (e.g. stress test, cath, DCCV, TEE, etc)?   Press F2        :[id number]}  ? ? ?Medication Adjustments/Labs and Tests Ordered: ?Current medicines are reviewed at length with the patient today.  Concerns regarding medicines are outlined above.  ?No orders of the  defined types were placed in this encounter. ? ?No orders of the defined types were placed in this encounter. ? ? ?There are no Patient Instructions on file for this visit.  ? ?Signed, ?Ledora Bottcher, PA  ?09/04/2021 11:45 AM    ?[facility] ?

## 2021-09-05 ENCOUNTER — Ambulatory Visit: Payer: Medicare (Managed Care) | Admitting: Physician Assistant

## 2021-09-05 ENCOUNTER — Telehealth: Payer: Self-pay | Admitting: Pulmonary Disease

## 2021-09-05 NOTE — Telephone Encounter (Signed)
Fax received from Dr. Dorna Leitz with [facility] to perform a Right Total knee arthroplasty on patient.  Patient needs surgery clearance. Patient was seen on 11/01/2020. Patient is scheduled for 09/13/2021. Office protocol is a risk assessment can be sent to surgeon if patient has been seen in 60 days or less.  ? ?Sending to Dr. Erin Fulling for risk assessment or recommendations if patient needs to be seen in office prior to surgical procedure.   ?

## 2021-09-06 NOTE — Patient Instructions (Addendum)
DUE TO COVID-19 ONLY TWO VISITORS  (aged 62 and older)  ARE ALLOWED TO COME WITH YOU AND STAY IN THE WAITING ROOM ONLY DURING PRE OP AND PROCEDURE.   ?**NO VISITORS ARE ALLOWED IN THE SHORT STAY AREA OR RECOVERY ROOM!!** ? ?IF YOU WILL BE ADMITTED INTO THE HOSPITAL YOU ARE ALLOWED ONLY FOUR SUPPORT PEOPLE DURING VISITATION HOURS ONLY (7 AM -8PM)   ?The support person(s) must pass our screening, gel in and out, and wear a mask at all times, including in the patient?s room. ?Patients must also wear a mask when staff or their support person are in the room. ?Visitors GUEST BADGE MUST BE WORN VISIBLY  ?One adult visitor may remain with you overnight and MUST be in the room by 8 P.M. ?  ? ? Your procedure is scheduled on: 09/20/21 ? ? Report to [facility] Main Entrance ? ?  Report to admitting at   7:15 AM ? ? Call this number if you have problems the morning of surgery [phone] ? ? Do not eat food :After Midnight. ? ? After Midnight you may have the following liquids until 7:00_ AM/ DAY OF SURGERY ? ?Water ?Black Coffee (sugar ok, NO MILK/CREAM OR CREAMERS)  ?Tea (sugar ok, NO MILK/CREAM OR CREAMERS) regular and decaf                             ?Plain Jell-O (NO RED)                                           ?Fruit ices (not with fruit pulp, NO RED)                                     ?Popsicles (NO RED)                                                                  ?Juice: apple, WHITE grape, WHITE cranberry ?Sports drinks like Gatorade (NO RED) ?Clear broth(vegetable,chicken,beef) ? ?             ? ?  ?  ?The day of surgery:  ?Drink ONE (1) Pre-Surgery Clear Ensure 6:45 at AM the morning of surgery. Drink in one sitting. Do not sip.  ?This drink was given to you during your hospital  ?pre-op appointment visit. ?Nothing else to drink after completing the  ?Pre-Surgery Clear Ensure AT 7:00 am. ?  ?       If you have questions, please contact your surgeon?s office. ? ? ?  ?  ?Oral Hygiene is also  important to reduce your risk of infection.                                    ?Remember - BRUSH YOUR TEETH THE MORNING OF SURGERY WITH YOUR REGULAR TOOTHPASTE ? ? Do NOT smoke after Midnight ? ? Take these medicines the morning of surgery with A SIP OF WATER: GABAPENTIN,  PREDNISONE, TOPAMAX, LEXAPRO, AMLODIPINE, OMEPRAZOLE ?             USE YOUR INHALERS AND BRING THEM WITH YOU ? ? ?Bring CPAP mask and tubing day of surgery. ?                  ?           You may not have any metal on your body including hair pins, jewelry, and body piercing ? ?           Do not wear make-up, lotions, powders, perfumes/cologne, or deodorant ? ?Do not wear nail polish including gel and S&S, artificial/acrylic nails, or any other type of covering on natural nails including finger and toenails. If you have artificial nails, gel coating, etc. that needs to be removed by a nail salon please have this removed prior to surgery or surgery may need to be canceled/ delayed if the surgeon/ anesthesia feels like they are unable to be safely monitored.  ? ?Do not shave  48 hours prior to surgery.  ? ?       ? ? Do not bring valuables to the hospital. [facility] NOT ?            RESPONSIBLE   FOR VALUABLES. ? ? Contacts, dentures or bridgework may not be worn into surgery. ? ? Bring small overnight bag day of surgery. ?  ? ? Special Instructions: Bring a copy of your healthcare power of attorney and living will documents   the day of surgery if you haven't scanned them before. ? ?            Please read over the following fact sheets you were given: IF [place] [phone] ? ?   [facility] - Preparing for Surgery ?Before surgery, you can play an important role.  Because skin is not sterile, your skin needs to be as free of germs as possible.  You can reduce the number of germs on your skin by washing with CHG (chlorahexidine gluconate) soap before surgery.  CHG is an antiseptic cleaner  which kills germs and bonds with the skin to continue killing germs even after washing. ?Please DO NOT use if you have an allergy to CHG or antibacterial soaps.  If your skin becomes reddened/irritated stop using the CHG and inform your nurse when you arrive at Short Stay. ?Do not shave (including legs and underarms) for at least 48 hours prior to the first CHG shower.   ?Please follow these instructions carefully: ? 1.  Shower with CHG Soap the night before surgery and the  morning of Surgery. ? 2.  If you choose to wash your hair, wash your hair first as usual with your  normal  shampoo. ? 3.  After you shampoo, rinse your hair and body thoroughly to remove the  shampoo.                        ?    4.  Use CHG as you would any other liquid soap.  You can apply chg directly  to the skin and wash  ?                     Gently with a scrungie or clean washcloth. ? 5.  Apply the CHG Soap to your body ONLY FROM THE NECK DOWN.   Do not use on  face/ open      ?                     Wound or open sores. Avoid contact with eyes, ears mouth and genitals (private parts).  ?                     Production manager,  Genitals (private parts) with your normal soap. ?            6.  Wash thoroughly, paying special attention to the area where your surgery  will be performed. ? 7.  Thoroughly rinse your body with warm water from the neck down. ? 8.  DO NOT shower/wash with your normal soap after using and rinsing off  the CHG Soap. ?               9.  Pat yourself dry with a clean towel. ?           10.  Wear clean pajamas. ?           11.  Place clean sheets on your bed the night of your first shower and do not  sleep with pets. ?Day of Surgery : ?Do not apply any lotions/deodorants the morning of surgery.  Please wear clean clothes to the hospital/surgery center. ? ?FAILURE TO FOLLOW THESE INSTRUCTIONS MAY RESULT IN THE CANCELLATION OF YOUR SURGERY ?PATIENT SIGNATURE_________________________________ ? ?NURSE  SIGNATURE__________________________________ ? ?________________________________________________________________________  ? ?Incentive Spirometer ? ?An incentive spirometer is a tool that can help keep your lungs clear and active. This tool measures how well you are filling your lungs with each breath. Taking long deep breaths may help reverse or decrease the chance of developing breathing (pulmonary) problems (especially infection) following: ?A long period of time when you are unable to move or be active. ?BEFORE THE PROCEDURE  ?If the spirometer includes an indicator to show your best effort, your nurse or respiratory therapist will set it to a desired goal. ?If possible, sit up straight or lean slightly forward. Try not to slouch. ?Hold the incentive spirometer in an upright position. ?INSTRUCTIONS FOR USE  ?Sit on the edge of your bed if possible, or sit up as far as you can in bed or on a chair. ?Hold the incentive spirometer in an upright position. ?Breathe out normally. ?Place the mouthpiece in your mouth and seal your lips tightly around it. ?Breathe in slowly and as deeply as possible, raising the piston or the ball toward the top of the column. ?Hold your breath for 3-5 seconds or for as long as possible. Allow the piston or ball to fall to the bottom of the column. ?Remove the mouthpiece from your mouth and breathe out normally. ?Rest for a few seconds and repeat Steps 1 through 7 at least 10 times every 1-2 hours when you are awake. Take your time and take a few normal breaths between deep breaths. ?The spirometer may include an indicator to show your best effort. Use the indicator as a goal to work toward during each repetition. ?After each set of 10 deep breaths, practice coughing to be sure your lungs are clear. If you have an incision (the cut made at the time of surgery), support your incision when coughing by placing a pillow or rolled up towels firmly against it. ?Once you are able to get out of  bed, walk around indoors and cough well. You may stop using the incentive spirometer when instructed by  your caregiver.  ?RISKS AND COMPLICATIONS ?Take your time so you do not get dizzy or light-headed. ?If you are in pain, you may need to t

## 2021-09-06 NOTE — Progress Notes (Signed)
?Cardiology Office Note:   ? ?Date:  09/12/2021  ? ?ID:  Wendy Leblanc, DOB [birth year], MRN [id number] ? ?PCP:  Drue Flirt, MD ?  ?[facility] Providers ?Cardiologist:  Sanda Klein, MD ?Cardiology APP:  Ledora Bottcher, [place] { ?Referring MD: Drue Flirt, MD  ? ?Chief Complaint  ?Patient presents with  ? Pre-op Exam  ? Follow-up  ? ? ?History of Present Illness:   ? ?Wendy Leblanc is a 61 y.o. female with a hx of ongoing smoking, hypertension, hyperlipidemia, PAD, CVA, and family history of heart disease.  She had a syncopal episode followed by transient left-sided weakness in 2019.  No stroke was seen on MRI brain.  Carotid CTA negative for extracranial or intracranial obstruction.  Echocardiogram at that time with an LVEF of 60 to 65% and no major valvular abnormalities.  ABI screening with reduced ABI on the right at 0.7 and normal on the left.  Follow-up arterial ultrasound with normal arterial flow in both legs.  She was seen by Dr. Sallyanne Kuster 11/25/2019 with chest pain and underwent CT coronary which showed a calcium score of 0 and no evidence of CAD.  She had extensive groundglass attenuation in the left lower lobe.  She now follows with pulmonology.  She was last seen by PA Meng in 03/01/2020 via telemedicine.  She had no cardiac complaints and he recommended no further cardiac work-up. ? ?She now is pending right total knee replacement and presents for preop clearance. She is emotionally upset because her mother and brother both died in 05/29/2022. Mother died of stage IV stomach cancer. Her brother died of a heart attack the week after. She has family here including sister, cousin, and daughter. She is here with a cane. She can walk the grocery store and can complete moderate house work without angina.  ? ?PACE has changed her BP medications: ?She is now taking 100-25 losartan-HCTZ, 25 mg hydralazine BID, and 25 mg spironolactone. She states she had a reaction to one of her BP  medications including lips swelling with SOB and hives. Her [facility]ACE paperwork lists amlodipine and lisinopril as allergies, I have updated our records. She has not had labs recently in the setting of ARB-HCTZ and spironolactone. Will check these.  ? ?Past Medical History:  ?Diagnosis Date  ? Anxiety   ? Arthritis   ? Carpal tunnel syndrome   ? Cataract   ? CVA (cerebral vascular accident) [facility])   ? Depression   ? Gait abnormality   ? Hyperlipidemia   ? Hypertension   ? Migraine   ? Osteoarthritis   ? Smoker   ? Stroke [facility]) 2019  ? ? ?Past Surgical History:  ?Procedure Laterality Date  ? ABDOMINAL HYSTERECTOMY    ? CARPAL TUNNEL RELEASE    ? hemorrhoid surgery    ? TUBAL LIGATION    ? ? ?Current Medications: ?Current Meds  ?Medication Sig  ? acetaminophen (TYLENOL) 500 MG tablet Take 500 mg by mouth every 6 (six) hours as needed.  ? albuterol (PROVENTIL) (2.5 MG/3ML) 0.083% nebulizer solution Take 3 mLs (2.5 mg total) by nebulization every 6 (six) hours as needed for wheezing or shortness of breath.  ? albuterol (VENTOLIN HFA) 108 (90 Base) MCG/ACT inhaler Inhale 1-2 puffs into the lungs every 4 (four) hours as needed for wheezing or shortness of breath.  ? aspirin EC 81 MG tablet Take 81 mg by mouth daily.  ? atorvastatin (LIPITOR) 20 MG tablet Take 20 mg by  mouth daily.  ? benzonatate (TESSALON) 100 MG capsule Take by mouth 3 (three) times daily as needed for cough.  ? Cholecalciferol (VITAMIN D3 PO) Take by mouth. 1000u daily  ? cyclobenzaprine (FLEXERIL) 5 MG tablet Take 5 mg by mouth 3 (three) times daily as needed.  ? EPINEPHrine 0.3 mg/0.3 mL IJ SOAJ injection Inject 0.3 mg into the muscle as needed for anaphylaxis.  ? escitalopram (LEXAPRO) 10 MG tablet Take 10 mg by mouth daily.  ? Fluticasone-Umeclidin-Vilant (TRELEGY ELLIPTA) 200-62.5-25 MCG/INH AEPB Inhale 1 puff into the lungs daily.  ? Fluticasone-Umeclidin-Vilant (TRELEGY ELLIPTA) 200-62.5-25 MCG/INH AEPB Inhale 1 puff into the lungs daily.  ? gabapentin  (NEURONTIN) 300 MG capsule TAKE 1 CAPSULE BY MOUTH DAILY IN THE MORNING, 1 TABLET DAILY IN THE EVENING AND 2 TABLETS AT BEDTIME  ? hydrALAZINE (APRESOLINE) 25 MG tablet Take 25 mg by mouth in the morning and at bedtime.  ? HYDROcodone-acetaminophen (NORCO/VICODIN) 5-325 MG tablet Take 1 tablet by mouth every 6 (six) hours as needed.  ? imipramine (TOFRANIL) 25 MG tablet Take 1 tablet (25 mg total) by mouth at bedtime.  ? lidocaine (LIDODERM) 5 %   ? loratadine (CLARITIN) 10 MG tablet Take 1 tablet (10 mg total) by mouth daily as needed for allergies or itching (rash).  ? losartan-hydrochlorothiazide (HYZAAR) 100-25 MG tablet Take 1 tablet by mouth daily.  ? montelukast (SINGULAIR) 10 MG tablet Take 1 tablet (10 mg total) by mouth at bedtime.  ? omeprazole (PRILOSEC) 20 MG capsule Take 20 mg by mouth daily.  ? predniSONE (DELTASONE) 10 MG tablet Take 4 tablets (40 mg total) by mouth daily.  ? spironolactone (ALDACTONE) 25 MG tablet Take 25 mg by mouth daily.  ? topiramate (TOPAMAX) 25 MG tablet Take 25 mg by mouth 2 (two) times daily.  ? [DISCONTINUED] amLODipine (NORVASC) 10 MG tablet Take 10 mg by mouth daily.  ? [DISCONTINUED] lisinopril-hydrochlorothiazide (PRINZIDE,ZESTORETIC) 20-25 MG tablet Take 0.5 tablets by mouth daily.  ?  ? ?Allergies:   Penicillins, Amlodipine, Contrast media [iodinated contrast media], Elemental sulfur, and Lisinopril  ? ?Social History  ? ?Socioeconomic History  ? Marital status: Widowed  ?  Spouse name: Not on file  ? Number of children: 2  ? Years of education: Not on file  ? Highest education level: 12th grade  ?Occupational History  ? Not on file  ?Tobacco Use  ? Smoking status: Every Day  ?  Packs/day: 0.50  ?  Years: 45.00  ?  Pack years: 22.50  ?  Types: Cigarettes  ? Smokeless tobacco: Never  ? Tobacco comments:  ?  down to 1/4 ppd 06/15/20  ?Vaping Use  ? Vaping Use: Former  ?Substance and Sexual Activity  ? Alcohol use: Not Currently  ? Drug use: Never  ? Sexual activity: Not  on file  ?Other Topics Concern  ? Not on file  ?Social History Narrative  ? Not on file  ? ?Social Determinants of Health  ? ?Financial Resource Strain: Not on file  ?Food Insecurity: Not on file  ?Transportation Needs: Not on file  ?Physical Activity: Not on file  ?Stress: Not on file  ?Social Connections: Not on file  ?  ? ?Family History: ?The patient's family history includes Breast cancer in her mother and sister; Diabetes in her brother and sister; Heart disease in her mother. There is no history of Colon cancer, Esophageal cancer, Rectal cancer, Stomach cancer, or Colon polyps. ? ?ROS:   ?Please see the history of  present illness.    ? All other systems reviewed and are negative. ? ?EKGs/Labs/Other Studies Reviewed:   ? ?The following studies were reviewed today: ? ?Echo 2019: ?Study Conclusions  ?- Left ventricle: The cavity size was normal. Wall thickness was  ?  normal. Systolic function was normal. The estimated ejection  ?  fraction was in the range of 60% to 65%. Left ventricular  ?  diastolic function parameters were normal ? ? ?CT coronary 2021: ?IMPRESSION: ?1. Coronary calcium score of 0. This was 0 percentile for age and ?sex matched control. ?  ?2. Normal coronary origin with right dominance. ?  ?3. No evidence of CAD. ?  ?4. CAD-RADS 0. No evidence of CAD (0%). Consider non-atherosclerotic ?causes of chest pain. ? ?EKG:  EKG is  ordered today.  The ekg ordered today demonstrates sinus rhythm with HR 91 ? ?Recent Labs: ?09/20/2020: ALT 13; B Natriuretic Peptide 33.5; BUN 12; Creatinine, Ser 0.96; Hemoglobin 14.9; Platelets 207; Potassium 4.3; Sodium 141  ?Recent Lipid Panel ?   ?Component Value Date/Time  ? CHOL 115 01/24/2018 0918  ? TRIG 32 01/24/2018 0918  ? HDL 51 01/24/2018 0918  ? CHOLHDL 2.3 01/24/2018 0918  ? VLDL 6 01/24/2018 0918  ? [place] 58 01/24/2018 0918  ? ? ? ?Risk Assessment/Calculations:   ?  ? ?    ? ?Physical Exam:   ? ?VS:  BP 126/78   Pulse 91   Ht '5\' 3"'$  (1.6 m)   Wt 207  lb 12.8 oz (94.3 kg)   SpO2 97%   BMI 36.81 kg/m?    ? ?Wt Readings from Last 3 Encounters:  ?09/12/21 207 lb 12.8 oz (94.3 kg)  ?11/01/20 208 lb (94.3 kg)  ?09/20/20 205 lb (93 kg)  ?  ? ?GEN:  Well nouris

## 2021-09-09 ENCOUNTER — Encounter (HOSPITAL_COMMUNITY)
Admission: RE | Admit: 2021-09-09 | Discharge: 2021-09-09 | Disposition: A | Discharge status: Home / Self Care | Payer: Medicare (Managed Care) | Source: Ambulatory Visit | Attending: Orthopedic Surgery | Admitting: Orthopedic Surgery

## 2021-09-09 NOTE — Progress Notes (Signed)
Pt missed her pre surgical visit for her surgery on 09/20/21 with Dr. Berenice Primas. Her number is continually busy, no answer at work . Her daughter's number has a voice mail with a different name. I let Dr. Berenice Primas office know. ?

## 2021-09-12 ENCOUNTER — Ambulatory Visit (INDEPENDENT_AMBULATORY_CARE_PROVIDER_SITE_OTHER): Payer: Medicare (Managed Care) | Admitting: Physician Assistant

## 2021-09-12 ENCOUNTER — Encounter: Payer: Self-pay | Admitting: Physician Assistant

## 2021-09-12 VITALS — BP 126/78 | HR 91 | Ht 63.0 in | Wt 207.8 lb

## 2021-09-12 DIAGNOSIS — G4733 Obstructive sleep apnea (adult) (pediatric): Secondary | ICD-10-CM

## 2021-09-12 DIAGNOSIS — E7800 Pure hypercholesterolemia, unspecified: Secondary | ICD-10-CM | POA: Diagnosis not present

## 2021-09-12 DIAGNOSIS — Z01810 Encounter for preprocedural cardiovascular examination: Secondary | ICD-10-CM | POA: Diagnosis not present

## 2021-09-12 DIAGNOSIS — Z01818 Encounter for other preprocedural examination: Secondary | ICD-10-CM

## 2021-09-12 DIAGNOSIS — I10 Essential (primary) hypertension: Secondary | ICD-10-CM | POA: Diagnosis not present

## 2021-09-12 NOTE — Patient Instructions (Addendum)
Medication Instructions:  Your physician recommends that you continue on your current medications as directed. Please refer to the Current Medication list given to you today.  *If you need a refill on your cardiac medications before your next appointment, please call your pharmacy*  Lab Work: NONE ordered at this time of appointment   If you have labs (blood work) drawn today and your tests are completely normal, you will receive your results only by: MyChart Message (if you have MyChart) OR A paper copy in the mail If you have any lab test that is abnormal or we need to change your treatment, we will call you to review the results.  Testing/Procedures: NONE ordered at this time of appointment   Follow-Up: At [facility], you and your health needs are our priority.  As part of our continuing mission to provide you with exceptional heart care, we have created designated Provider Care Teams.  These Care Teams include your primary Cardiologist (physician) and Advanced Practice Providers (APPs -  Physician Assistants and Nurse Practitioners) who all work together to provide you with the care you need, when you need it.   Your next appointment:   1 year(s)  The format for your next appointment:   In Person  Provider:   Mihai Croitoru, MD     Other Instructions   Important Information About Sugar       

## 2021-09-13 ENCOUNTER — Ambulatory Visit: Payer: Medicare (Managed Care) | Admitting: Pulmonary Disease

## 2021-09-13 NOTE — Progress Notes (Deleted)
Synopsis: Referred by Vassie Moment, MD for abnormal chest imaging  Subjective:   PATIENT ID: Wendy Leblanc GENDER: female DOB: 05/27/1960, MRN: [id number]  HPI  No chief complaint on file.  [name] is a 61 year old woman, daily smoker with history of hypertension, hyperlipidemia and CVA who returns to pulmonary clinic for follow up of asthma and pre-op respiratory evaluation.  She was started on Trelegy Ellipta 200 at last visit.   She is having right total knee arthroplasty on 09/20/21.   OV 11/01/20 She had an ER visit on 09/20/2020 for asthma exacerbation.  She was given methylprednisolone IV 125 mg and a DuoNeb nebulizer treatment with improvement in her breathing.  She was sent home on a steroid taper.  She reports her breathing is back to baseline currently.  She remains on Advair 250-50 MCG 1 puff twice daily and she did not receive the increased inhaler dose of 500-50 MCG 1 puff twice daily after the last visit as planned.  She has an albuterol rescue inhaler to be to use as needed.  She continues to smoke 3-5 cigarettes a day. Her daughter smokes in the home as well. She did not try nicotine patches after last visit as they were not covered by insurance.   She remains on CPAP at night. Continues to complain of fatigue.   She had CT chest scan in 04/2020 that showed resolution of the ground glass opacities in the left lower lobe noted on her CT coronary scan previously. No other concerning nodules noted.    PFTs on 06/13/20 are within normal limits. No obstructive or restrictive defects noted. No significant bronchodilator response (currently on wixella) and no diffusion defect.  Past Medical History:  Diagnosis Date   Anxiety    Arthritis    Carpal tunnel syndrome    Cataract    CVA (cerebral vascular accident) ([place])    Depression    Gait abnormality    Hyperlipidemia    Hypertension    Migraine    Osteoarthritis    Smoker    Stroke ([place]) 2019     Family  History  Problem Relation Age of Onset   Breast cancer Mother    Heart disease Mother    Breast cancer Sister    Diabetes Brother    Diabetes Sister    Colon cancer Neg Hx    Esophageal cancer Neg Hx    Rectal cancer Neg Hx    Stomach cancer Neg Hx    Colon polyps Neg Hx      Social History   Socioeconomic History   Marital status: Widowed    Spouse name: Not on file   Number of children: 2   Years of education: Not on file   Highest education level: 12th grade  Occupational History   Not on file  Tobacco Use   Smoking status: Every Day    Packs/day: 0.50    Years: 45.00    Pack years: 22.50    Types: Cigarettes   Smokeless tobacco: Never   Tobacco comments:    down to 1/4 ppd 06/15/20  Vaping Use   Vaping Use: Former  Substance and Sexual Activity   Alcohol use: Not Currently   Drug use: Never   Sexual activity: Not on file  Other Topics Concern   Not on file  Social History Narrative   Not on file   Social Determinants of Health   Financial Resource Strain: Not on file  Food Insecurity:  Not on file  Transportation Needs: Not on file  Physical Activity: Not on file  Stress: Not on file  Social Connections: Not on file  Intimate Partner Violence: Not on file     Allergies  Allergen Reactions   Penicillins Anaphylaxis   Amlodipine    Contrast Media [Iodinated Contrast Media] Hives   Elemental Sulfur Hives   Lisinopril Swelling     Outpatient Medications Prior to Visit  Medication Sig Dispense Refill   acetaminophen (TYLENOL) 500 MG tablet Take 500 mg by mouth every 6 (six) hours as needed.     albuterol (PROVENTIL) (2.5 MG/3ML) 0.083% nebulizer solution Take 3 mLs (2.5 mg total) by nebulization every 6 (six) hours as needed for wheezing or shortness of breath. 75 mL 12   albuterol (VENTOLIN HFA) 108 (90 Base) MCG/ACT inhaler Inhale 1-2 puffs into the lungs every 4 (four) hours as needed for wheezing or shortness of breath. 8 g 11   aspirin EC 81 MG  tablet Take 81 mg by mouth daily.     atorvastatin (LIPITOR) 20 MG tablet Take 20 mg by mouth daily.     benzonatate (TESSALON) 100 MG capsule Take by mouth 3 (three) times daily as needed for cough.     Cholecalciferol (VITAMIN D3 PO) Take by mouth. 1000u daily     cyclobenzaprine (FLEXERIL) 5 MG tablet Take 5 mg by mouth 3 (three) times daily as needed.     EPINEPHrine 0.3 mg/0.3 mL IJ SOAJ injection Inject 0.3 mg into the muscle as needed for anaphylaxis. 1 each 0   escitalopram (LEXAPRO) 10 MG tablet Take 10 mg by mouth daily.     Fluticasone-Umeclidin-Vilant (TRELEGY ELLIPTA) 200-62.5-25 MCG/INH AEPB Inhale 1 puff into the lungs daily. 60 each 6   Fluticasone-Umeclidin-Vilant (TRELEGY ELLIPTA) 200-62.5-25 MCG/INH AEPB Inhale 1 puff into the lungs daily. 1 each 0   gabapentin (NEURONTIN) 300 MG capsule TAKE 1 CAPSULE BY MOUTH DAILY IN THE MORNING, 1 TABLET DAILY IN THE EVENING AND 2 TABLETS AT BEDTIME 120 capsule 5   hydrALAZINE (APRESOLINE) 25 MG tablet Take 25 mg by mouth in the morning and at bedtime.     HYDROcodone-acetaminophen (NORCO/VICODIN) 5-325 MG tablet Take 1 tablet by mouth every 6 (six) hours as needed. 10 tablet 0   imipramine (TOFRANIL) 25 MG tablet Take 1 tablet (25 mg total) by mouth at bedtime. 30 tablet 5   lidocaine (LIDODERM) 5 %      loratadine (CLARITIN) 10 MG tablet Take 1 tablet (10 mg total) by mouth daily as needed for allergies or itching (rash). 6 tablet 0   losartan-hydrochlorothiazide (HYZAAR) 100-25 MG tablet Take 1 tablet by mouth daily.     montelukast (SINGULAIR) 10 MG tablet Take 1 tablet (10 mg total) by mouth at bedtime. 30 tablet 11   omeprazole (PRILOSEC) 20 MG capsule Take 20 mg by mouth daily.     predniSONE (DELTASONE) 10 MG tablet Take 4 tablets (40 mg total) by mouth daily. 20 tablet 0   spironolactone (ALDACTONE) 25 MG tablet Take 25 mg by mouth daily.     topiramate (TOPAMAX) 25 MG tablet Take 25 mg by mouth 2 (two) times daily.  0   No  facility-administered medications prior to visit.    Review of Systems  Constitutional:  Positive for malaise/fatigue. Negative for chills, fever and weight loss.  HENT:  Negative for congestion, sinus pain and sore throat.   Eyes: Negative.   Respiratory:  Negative for cough, hemoptysis, sputum  production, shortness of breath and wheezing.   Cardiovascular:  Negative for chest pain, palpitations, orthopnea, claudication and leg swelling.  Gastrointestinal:  Negative for abdominal pain, heartburn, nausea and vomiting.  Genitourinary: Negative.   Musculoskeletal:  Negative for joint pain and myalgias.  Skin:  Negative for rash.  Neurological:  Negative for weakness.  Endo/Heme/Allergies: Negative.   Psychiatric/Behavioral: Negative.     Objective:   There were no vitals filed for this visit.    Physical Exam Constitutional:      General: She is not in acute distress.    Appearance: She is not ill-appearing.  HENT:     Head: Normocephalic and atraumatic.  Eyes:     General: No scleral icterus.    Conjunctiva/sclera: Conjunctivae normal.     Pupils: Pupils are equal, round, and reactive to light.  Cardiovascular:     Rate and Rhythm: Normal rate and regular rhythm.     Pulses: Normal pulses.     Heart sounds: Normal heart sounds. No murmur heard. Pulmonary:     Effort: Pulmonary effort is normal.     Breath sounds: Normal breath sounds. No wheezing, rhonchi or rales.  Abdominal:     General: Bowel sounds are normal.     Palpations: Abdomen is soft.  Musculoskeletal:     Right lower leg: No edema.     Left lower leg: No edema.  Lymphadenopathy:     Cervical: No cervical adenopathy.  Skin:    General: Skin is warm and dry.  Neurological:     General: No focal deficit present.     Mental Status: She is alert.  Psychiatric:        Mood and Affect: Mood normal.        Behavior: Behavior normal.        Thought Content: Thought content normal.        Judgment: Judgment  normal.    CBC    Component Value Date/Time   WBC 6.0 09/20/2020 0944   RBC 5.15 (H) 09/20/2020 0944   HGB 14.9 09/20/2020 0944   HCT 46.9 (H) 09/20/2020 0944   PLT 207 09/20/2020 0944   MCV 91.1 09/20/2020 0944   MCH 28.9 09/20/2020 0944   MCHC 31.8 09/20/2020 0944   RDW 14.3 09/20/2020 0944   LYMPHSABS 1.7 09/20/2020 0944   MONOABS 0.4 09/20/2020 0944   EOSABS 0.4 09/20/2020 0944   BASOSABS 0.0 09/20/2020 0944   Chest imaging: 04/18/20 CT Chest wo contrast Mediastinum/Nodes: No enlarged mediastinal or axillary lymph nodes. Thyroid gland, trachea, and esophagus demonstrate no significant findings.   Lungs/Pleura: Previously noted left basilar pulmonary infiltrate has resolved. The lungs are clear. No focal pulmonary nodule or infiltrate. No pneumothorax or pleural effusion. Central airways are widely patent.  01/20/20 CT Coronary Ground-glass attenuation in the basal segments of the left lower lobe. Some septal thickening in the periphery of the right lower lobe. Within the visualized portions of the thorax there are no suspicious appearing pulmonary nodules or masses, there are no pleural effusions, no pneumothorax and no lymphadenopathy. Visualized portions of the upper abdomen are unremarkable. There are no aggressive appearing lytic or blastic lesions noted in the visualized portions of the skeleton.  CXR 09/09/2019 The heart size and mediastinal contours are within normal limits. Both lungs are clear. No pneumothorax or pleural effusion is noted. The visualized skeletal structures are unremarkable.  PFT:    Latest Ref Rng & Units 06/13/2020    8:50 AM  PFT Results  FVC-Pre L 2.14    FVC-Predicted Pre % 84    FVC-Post L 2.16    FVC-Predicted Post % 84    Pre FEV1/FVC % % 75    Post FEV1/FCV % % 73    FEV1-Pre L 1.60    FEV1-Predicted Pre % 79    FEV1-Post L 1.58    DLCO uncorrected ml/min/mmHg 20.39    DLCO UNC% % 104    DLCO corrected ml/min/mmHg 20.39     DLCO COR %Predicted % 104    DLVA Predicted % 115      Echo: 01/24/2018 - Left ventricle: The cavity size was normal. Wall thickness was    normal. Systolic function was normal. The estimated ejection    fraction was in the range of 60% to 65%. Left ventricular    diastolic function parameters were normal.     Assessment & Plan:   No diagnosis found.  Discussion: Wendy Leblanc is a 61 year old woman, daily smoker with history of hypertension, hyperlipidemia and CVA who returns to pulmonary clinic for follow up of asthma.  She has moderate persistent asthma and recently had an exacerbation requiring an ER visit last month.  We will change her maintenance inhaler therapy from Advair 250-50 MCG to Trelegy Ellipta 200 1 puff daily.   She can continue to use albuterol as needed.  She is to continue montelukast 10 mg daily.  We will continue to work on smoking cessation in the future.  She is to follow up in 6 months.  Freda Jackson, MD [facility] Office: [phone]    Current Outpatient Medications:    acetaminophen (TYLENOL) 500 MG tablet, Take 500 mg by mouth every 6 (six) hours as needed., Disp: , Rfl:    albuterol (PROVENTIL) (2.5 MG/3ML) 0.083% nebulizer solution, Take 3 mLs (2.5 mg total) by nebulization every 6 (six) hours as needed for wheezing or shortness of breath., Disp: 75 mL, Rfl: 12   albuterol (VENTOLIN HFA) 108 (90 Base) MCG/ACT inhaler, Inhale 1-2 puffs into the lungs every 4 (four) hours as needed for wheezing or shortness of breath., Disp: 8 g, Rfl: 11   aspirin EC 81 MG tablet, Take 81 mg by mouth daily., Disp: , Rfl:    atorvastatin (LIPITOR) 20 MG tablet, Take 20 mg by mouth daily., Disp: , Rfl:    benzonatate (TESSALON) 100 MG capsule, Take by mouth 3 (three) times daily as needed for cough., Disp: , Rfl:    Cholecalciferol (VITAMIN D3 PO), Take by mouth. 1000u daily, Disp: , Rfl:    cyclobenzaprine (FLEXERIL) 5 MG tablet, Take 5 mg  by mouth 3 (three) times daily as needed., Disp: , Rfl:    EPINEPHrine 0.3 mg/0.3 mL IJ SOAJ injection, Inject 0.3 mg into the muscle as needed for anaphylaxis., Disp: 1 each, Rfl: 0   escitalopram (LEXAPRO) 10 MG tablet, Take 10 mg by mouth daily., Disp: , Rfl:    Fluticasone-Umeclidin-Vilant (TRELEGY ELLIPTA) 200-62.5-25 MCG/INH AEPB, Inhale 1 puff into the lungs daily., Disp: 60 each, Rfl: 6   Fluticasone-Umeclidin-Vilant (TRELEGY ELLIPTA) 200-62.5-25 MCG/INH AEPB, Inhale 1 puff into the lungs daily., Disp: 1 each, Rfl: 0   gabapentin (NEURONTIN) 300 MG capsule, TAKE 1 CAPSULE BY MOUTH DAILY IN THE MORNING, 1 TABLET DAILY IN THE EVENING AND 2 TABLETS AT BEDTIME, Disp: 120 capsule, Rfl: 5   hydrALAZINE (APRESOLINE) 25 MG tablet, Take 25 mg by mouth in the morning and at bedtime., Disp: , Rfl:  HYDROcodone-acetaminophen (NORCO/VICODIN) 5-325 MG tablet, Take 1 tablet by mouth every 6 (six) hours as needed., Disp: 10 tablet, Rfl: 0   imipramine (TOFRANIL) 25 MG tablet, Take 1 tablet (25 mg total) by mouth at bedtime., Disp: 30 tablet, Rfl: 5   lidocaine (LIDODERM) 5 %, , Disp: , Rfl:    loratadine (CLARITIN) 10 MG tablet, Take 1 tablet (10 mg total) by mouth daily as needed for allergies or itching (rash)., Disp: 6 tablet, Rfl: 0   losartan-hydrochlorothiazide (HYZAAR) 100-25 MG tablet, Take 1 tablet by mouth daily., Disp: , Rfl:    montelukast (SINGULAIR) 10 MG tablet, Take 1 tablet (10 mg total) by mouth at bedtime., Disp: 30 tablet, Rfl: 11   omeprazole (PRILOSEC) 20 MG capsule, Take 20 mg by mouth daily., Disp: , Rfl:    predniSONE (DELTASONE) 10 MG tablet, Take 4 tablets (40 mg total) by mouth daily., Disp: 20 tablet, Rfl: 0   spironolactone (ALDACTONE) 25 MG tablet, Take 25 mg by mouth daily., Disp: , Rfl:    topiramate (TOPAMAX) 25 MG tablet, Take 25 mg by mouth 2 (two) times daily., Disp: , Rfl: 0

## 2021-09-18 ENCOUNTER — Ambulatory Visit (INDEPENDENT_AMBULATORY_CARE_PROVIDER_SITE_OTHER): Payer: Medicare (Managed Care) | Admitting: Primary Care

## 2021-09-18 ENCOUNTER — Encounter: Payer: Self-pay | Admitting: Primary Care

## 2021-09-18 DIAGNOSIS — Z01811 Encounter for preprocedural respiratory examination: Secondary | ICD-10-CM | POA: Diagnosis not present

## 2021-09-18 DIAGNOSIS — G4733 Obstructive sleep apnea (adult) (pediatric): Secondary | ICD-10-CM | POA: Diagnosis not present

## 2021-09-18 DIAGNOSIS — J4540 Moderate persistent asthma, uncomplicated: Secondary | ICD-10-CM

## 2021-09-18 NOTE — Progress Notes (Addendum)
? ?'@Patient'$  ID: Wendy Leblanc, female    DOB: [birth year], 61 y.o.   MRN: [id number] ? ?No chief complaint on file. ? ? ?Referring provider: ?Drue Flirt, MD ? ?HPI: ?61 year old female, current every day smoker. PMH significant for hypertension, hyperlipidemia, CVA, moderate persistent asthma, OSA. Patient of Dr. Erin Fulling, last seen on 11/01/20.  ? ?09/18/2021 ?Patient presents today for surgical clearance. She is followed by our office for hx moderate persistent asthma. During her last visit in June she was changed from Advair 250-74mg to THartline Pulmonary function testing in February 2022 were normal, FEV1 1.58L (79%). She is scheduled for upcoming right total knee arthroplasty on May 19th with Dr. GBerenice Primas She noticed an improvement in her breathing when switched to Trelegy. She uses her Albuterol rescue inhaler on average 1-2 times a day as needed. No acute respiratory complaints today. She is ordered for CXR to be completed with pre-op work up tomorrow. She denies acute shortness of breath, chest tightness, wheezing or cough.  ? ?Pulmonary function testing ?06/13/20 PFTs- FVC 2.16 (84%), FEV1 1.58 (79%), ratio 92 ?Normal pulmonary function, normal diffusion capacity. No BD response.  ? ?Allergies  ?Allergen Reactions  ? Penicillins Anaphylaxis  ? Amlodipine Hives and Swelling  ?  Lip swelling  ? Contrast Media [Iodinated Contrast Media] Hives  ? Elemental Sulfur Hives  ? Lisinopril Swelling  ? ? ?Immunization History  ?Administered Date(s) Administered  ? Moderna Sars-Covid-2 Vaccination 08/02/2019, 08/24/2019, 03/10/2020, 10/19/2020  ? ? ?Past Medical History:  ?Diagnosis Date  ? Anxiety   ? Arthritis   ? Carpal tunnel syndrome   ? Cataract   ? [facility]cere[facility]) [facility]   ? Depression   ? Gait abnormality   ? Hyperlipidemia   ? Hypertension   ? Migraine   ? Osteoarthritis   ? Smoker   ? Stroke ([facility] 2019  ? ? ?Tobacco History: ?Social History  ? ?Tobacco Use  ?Smoking Status Every  Day  ? Packs/day: 0.50  ? Years: 45.00  ? Pack years: 22.50  ? Types: Cigarettes  ?Smokeless Tobacco Never  ?Tobacco Comments  ? down to 1/4 ppd 06/15/20  ? ?Ready to quit: Not Answered ?Counseling given: Not Answered ?Tobacco comments: down to 1/4 ppd 06/15/20 ? ? ?Outpatient Medications Prior to Visit  ?Medication Sig Dispense Refill  ? albuterol (PROVENTIL) (2.5 MG/3ML) 0.083% nebulizer solution Take 3 mLs (2.5 mg total) by nebulization every 6 (six) hours as needed for wheezing or shortness of breath. 75 mL 12  ? albuterol (VENTOLIN HFA) 108 (90 Base) MCG/ACT inhaler Inhale 1-2 puffs into the lungs every 4 (four) hours as needed for wheezing or shortness of breath. 8 g 11  ? amitriptyline (ELAVIL) 10 MG tablet Take 10 mg by mouth at bedtime.    ? aspirin EC 81 MG tablet Take 81 mg by mouth daily.    ? cholecalciferol (VITAMIN D3) 25 MCG (1000 UNIT) tablet Take 1,000 Units by mouth daily.    ? EPINEPHrine 0.3 mg/0.3 mL IJ SOAJ injection Inject 0.3 mg into the muscle as needed for anaphylaxis. 1 each 0  ? Fluticasone-Umeclidin-Vilant (TRELEGY ELLIPTA) 200-62.5-25 MCG/INH AEPB Inhale 1 puff into the lungs daily. 60 each 6  ? gabapentin (NEURONTIN) 300 MG capsule TAKE 1 CAPSULE BY MOUTH DAILY IN THE MORNING, 1 TABLET DAILY IN THE EVENING AND 2 TABLETS AT BEDTIME (Patient taking differently: Take 300-600 mg by mouth See admin instructions. Take 300 mg in the morning and 600  mg in the evening) 120 capsule 5  ? hydrALAZINE (APRESOLINE) 25 MG tablet Take 25 mg by mouth in the morning and at bedtime.    ? lidocaine 4 % Place 1 patch onto the skin daily as needed (pain).    ? losartan-hydrochlorothiazide (HYZAAR) 100-25 MG tablet Take 1 tablet by mouth daily.    ? montelukast (SINGULAIR) 10 MG tablet Take 1 tablet (10 mg total) by mouth at bedtime. 30 tablet 11  ? omeprazole (PRILOSEC) 20 MG capsule Take 20 mg by mouth daily.    ? pyridOXINE (VITAMIN B-6) 50 MG tablet Take 50 mg by mouth daily.    ? senna-docusate  (SENOKOT-S) 8.6-50 MG tablet Take 1 tablet by mouth at bedtime.    ? spironolactone (ALDACTONE) 25 MG tablet Take 25 mg by mouth daily.    ? topiramate (TOPAMAX) 25 MG tablet Take 25 mg by mouth daily as needed (migraines).  0  ? ?No facility-administered medications prior to visit.  ? ?Review of Systems ? ?Review of Systems  ?Constitutional: Negative.   ?HENT: Negative.    ?Respiratory: Negative.  Negative for cough, shortness of breath and wheezing.   ?Cardiovascular: Negative.   ? ? ?Physical Exam ? ?BP 102/76 (BP Location: Right Arm, Patient Position: Sitting, Cuff Size: Large)   Pulse 79   Temp 98.1 ?F (36.7 ?C) (Oral)   Ht '5\' 3"'$  (1.6 m)   Wt 204 lb 3.2 oz (92.6 kg)   SpO2 98%   BMI 36.17 kg/m?  ?Physical Exam ?Constitutional:   ?   Appearance: Normal appearance.  ?HENT:  ?   Head: Normocephalic and atraumatic.  ?   Mouth/Throat:  ?   Mouth: Mucous membranes are moist.  ?   Pharynx: Oropharynx is clear.  ?Cardiovascular:  ?   Rate and Rhythm: Normal rate and regular rhythm.  ?Pulmonary:  ?   Effort: Pulmonary effort is normal.  ?   Breath sounds: Normal breath sounds. No wheezing, rhonchi or rales.  ?Musculoskeletal:     ?   General: Normal range of motion.  ?   Cervical back: Normal range of motion and neck supple.  ?Skin: ?   General: Skin is warm and dry.  ?Neurological:  ?   General: No focal deficit present.  ?   Mental Status: She is alert and oriented to person, place, and time. Mental status is at baseline.  ?Psychiatric:     ?   Mood and Affect: Mood normal.     ?   Behavior: Behavior normal.     ?   Thought Content: Thought content normal.     ?   Judgment: Judgment normal.  ?  ? ?Lab Results: ? ?CBC ?   ?Component Value Date/Time  ? WBC 6.0 09/20/2020 0944  ? RBC 5.15 (H) 09/20/2020 0944  ? HGB 14.9 09/20/2020 0944  ? HCT 46.9 (H) 09/20/2020 0944  ? PLT 207 09/20/2020 0944  ? MCV 91.1 09/20/2020 0944  ? MCH 28.9 09/20/2020 0944  ? MCHC 31.8 09/20/2020 0944  ? RDW 14.3 09/20/2020 0944  ?  LYMPHSABS 1.7 09/20/2020 0944  ? MONOABS 0.4 09/20/2020 0944  ? EOSABS 0.4 09/20/2020 0944  ? BASOSABS 0.0 09/20/2020 0944  ? ? ?BMET ?   ?Component Value Date/Time  ? NA 141 09/20/2020 0944  ? NA 143 01/17/2020 0812  ? K 4.3 09/20/2020 0944  ? CL 103 09/20/2020 0944  ? CO2 28 09/20/2020 0944  ? GLUCOSE 105 (H) 09/20/2020 0944  ?  BUN 12 09/20/2020 0944  ? BUN 16 01/17/2020 0812  ? CREATININE 0.96 09/20/2020 0944  ? CALCIUM 9.5 09/20/2020 0944  ? GFRNONAA >60 09/20/2020 0944  ? GFRAA 74 01/17/2020 0812  ? ? ?BNP ?   ?Component Value Date/Time  ? BNP 33.5 09/20/2020 1045  ? ? ?ProBNP ?No results found for: PROBNP ? ?Imaging: ?No results found. ? ? ?Assessment & Plan:  ? ?Moderate persistent asthma ?- Stable; Continue Trelegy 272mg one puff daily  ? ?OSA (obstructive sleep apnea) ?- Hx moderate OSA. She is not consistently wearing her CPAP. Encourage her to resume use. We do not manage her sleep apnea, advised she follow-up with PCP or her sleep doctor to discuss.  ? ?Pre-operative respiratory examination ?- Patient is intermediate risk for prolonged mech ventilation and/or post op pulmonary complications d.t moderate OSA and moderate persistent asthma, she is optimized from pulmonary standpoint for surgery. She does have hx moderate OSA and is not wearing her CPAP. Asthma is well controlled on Trelegy 2031m. No recent exacerbation or acute respiratory symptoms. Exam today was benign. Needs pre-op CXR which has been ordered by Dr. GrBerenice Primas ? ?Major Pulmonary risks identified in the multifactorial risk analysis are but not limited to a) pneumonia; b) recurrent intubation risk; c) prolonged or recurrent acute respiratory failure needing mechanical ventilation; d) prolonged hospitalization; e) DVT/Pulmonary embolism; f) Acute Pulmonary edema ? ?Recommend ?1. Short duration of surgery as much as possible and avoid paralytic if possible ?2. Recovery in step down or ICU with Pulmonary consultation if needed  ?3. DVT  prophylaxis ?4. Aggressive pulmonary toilet with o2, bronchodilatation, and incentive spirometry and early ambulation ? ? ? ? ?1) RISK FOR PROLONGED MECHANICAL VENTILAION - > 48h ? ?1A) [place] - Prolonged mech ventilation risk

## 2021-09-18 NOTE — Assessment & Plan Note (Addendum)
-   Patient is intermediate risk for prolonged mech ventilation and/or post op pulmonary complications d.t moderate OSA and moderate persistent asthma, she is optimized from pulmonary standpoint for surgery. She does have hx moderate OSA and is not wearing her CPAP. Asthma is well controlled on Trelegy 262mg. No recent exacerbation or acute respiratory symptoms. Exam today was benign. Needs pre-op CXR which has been ordered by Dr. GBerenice Primas  ? ?Major Pulmonary risks identified in the multifactorial risk analysis are but not limited to a) pneumonia; b) recurrent intubation risk; c) prolonged or recurrent acute respiratory failure needing mechanical ventilation; d) prolonged hospitalization; e) DVT/Pulmonary embolism; f) Acute Pulmonary edema ? ?Recommend ?1. Short duration of surgery as much as possible and avoid paralytic if possible ?2. Recovery in step down or ICU with Pulmonary consultation if needed  ?3. DVT prophylaxis ?4. Aggressive pulmonary toilet with o2, bronchodilatation, and incentive spirometry and early ambulation ? ? ? ?

## 2021-09-18 NOTE — Progress Notes (Addendum)
COVID Vaccine Completed:  Yes  Date of COVID positive in last 90 days:  No  PCP - Vanita Panda, MD [facility]  Cardiologist - Sanda Klein, MD  Pulmonologist - Freda Jackson, MD Intermediate risk per pulmonology  Cardiac clearance in Epic dated 09-12-21 by Fabian Sharp, PA  Chest x-ray - 09-19-21 Epic EKG - 09-12-21 Epic Stress Test - attempted, unable to tolerate several years ago ECHO - greater than 2 years Epic Cardiac Cath - N/A Pacemaker/ICD device last checked: Spinal Cord Stimulator: Coronary CT - 2021 Epic  Bowel Prep - N/A  Sleep Study - Yes, +sleep apnea CPAP - Not currently using, cannot tolerate. Has not used in three months  Fasting Blood Sugar - N/A Checks Blood Sugar _____ times a day  Blood Thinner Instructions: Aspirin Instructions:  ASA 81  Last Dose:  stopped a week ago  Activity level:   Can go up a flight of stairs and perform activities of daily living.  Has to stop and rest due to shortness of breath with activity.  Patient states that she feels stable right now with breathing, at times the shortness of breath worsens.      Anesthesia review: HTN, PAD, hx of CVA with weakness in R leg,  OSA.  Moderate persistent asthma, patient is a smoker  Patient denies shortness of breath, fever, cough and chest pain at PAT appointment  Patient verbalized understanding of instructions that were given to them at the PAT appointment. Patient was also instructed that they will need to review over the PAT instructions again at home before surgery.

## 2021-09-18 NOTE — Assessment & Plan Note (Signed)
-   Hx moderate OSA. She is not consistently wearing her CPAP. Encourage her to resume use. We do not manage her sleep apnea, advised she follow-up with PCP or her sleep doctor to discuss.  ?

## 2021-09-18 NOTE — Patient Instructions (Addendum)
DUE TO COVID-19 ONLY TWO VISITORS  (aged 62 and older)  IS ALLOWED TO COME WITH YOU AND STAY IN THE WAITING ROOM ONLY DURING PRE OP AND PROCEDURE.   **NO VISITORS ARE ALLOWED IN THE SHORT STAY AREA OR RECOVERY ROOM!!**  IF YOU WILL BE ADMITTED INTO THE HOSPITAL YOU ARE ALLOWED ONLY FOUR SUPPORT PEOPLE DURING VISITATION HOURS ONLY (7 AM -8PM)   The support person(s) must pass our screening, gel in and out Visitors GUEST BADGE MUST BE WORN VISIBLY  One adult visitor may remain with you overnight and MUST be in the room by 8 P.M.   You are not required to [employer] often Do NOT share personal items Notify your provider if you are in close contact with someone who has COVID or you develop fever 100.4 or greater, new onset of sneezing, cough, sore throat, shortness of breath or body aches.        Your procedure is scheduled on:  09-20-21   Report to [facility] Main Entrance    Report to admitting at 7:15 AM   Call this number if you have problems the morning of surgery [phone]   Do not eat food :After Midnight.   After Midnight you may have the following liquids until 7:00 AM DAY OF SURGERY  Water Black Coffee (sugar ok, NO MILK/CREAM OR CREAMERS)  Tea (sugar ok, NO MILK/CREAM OR CREAMERS) regular and decaf                             Plain Jell-O (NO RED)                                           Fruit ices (not with fruit pulp, NO RED)                                     Popsicles (NO RED)                                                                  Juice: apple, WHITE grape, WHITE cranberry Sports drinks like Gatorade (NO RED) Clear broth(vegetable,chicken,beef)                   The day of surgery:  Drink ONE (1) Pre-Surgery Clear Ensure at 7:00 AM the morning of surgery. Drink in one sitting. Do not sip.  This drink was given to you during your hospital  pre-op appointment visit. Nothing else to drink after completing the Pre-Surgery Clear  Ensure.          If you have questions, please contact your surgeon's office.   FOLLOW ANY ADDITIONAL PRE OP INSTRUCTIONS YOU RECEIVED FROM YOUR SURGEON'S OFFICE!!!     Oral Hygiene is also important to reduce your risk of infection.                                    Remember - BRUSH YOUR  TEETH THE MORNING OF SURGERY WITH YOUR REGULAR TOOTHPASTE   Do NOT smoke after Midnight   Take these medicines the morning of surgery with A SIP OF WATER: Gabapentin, Hydralazine, Omeprazole, Topiramate  Bring CPAP mask and tubing day of surgery.                              You may not have any metal on your body including hair pins, jewelry, and body piercing             Do not wear make-up, lotions, powders, perfumes or deodorant  Do not wear nail polish including gel and S&S, artificial/acrylic nails, or any other type of covering on natural nails including finger and toenails. If you have artificial nails, gel coating, etc. that needs to be removed by a nail salon please have this removed prior to surgery or surgery may need to be canceled/ delayed if the surgeon/ anesthesia feels like they are unable to be safely monitored.   Do not shave  48 hours prior to surgery.    Do not bring valuables to the hospital. [place].   Contacts, dentures or bridgework may not be worn into surgery.   Bring small overnight bag day of surgery.   Please read over the following fact sheets you were given: IF YOU HAVE QUESTIONS ABOUT YOUR PRE-OP INSTRUCTIONS PLEASE CALL [facility] - Preparing for Surgery Before surgery, you can play an important role.  Because skin is not sterile, your skin needs to be as free of germs as possible.  You can reduce the number of germs on your skin by washing with CHG (chlorahexidine gluconate) soap before surgery.  CHG is an antiseptic cleaner which kills germs and bonds with the skin to continue killing germs even after  washing. Please DO NOT use if you have an allergy to CHG or antibacterial soaps.  If your skin becomes reddened/irritated stop using the CHG and inform your nurse when you arrive at Short Stay. Do not shave (including legs and underarms) for at least 48 hours prior to the first CHG shower.  You may shave your face/neck.  Please follow these instructions carefully:  1.  Shower with CHG Soap the night before surgery and the  morning of surgery.  2.  If you choose to wash your hair, wash your hair first as usual with your normal  shampoo.  3.  After you shampoo, rinse your hair and body thoroughly to remove the shampoo.                             4.  Use CHG as you would any other liquid soap.  You can apply chg directly to the skin and wash.  Gently with a scrungie or clean washcloth.  5.  Apply the CHG Soap to your body ONLY FROM THE NECK DOWN.   Do   not use on face/ open                           Wound or open sores. Avoid contact with eyes, ears mouth and   genitals (private parts).                       Wash face,  Development worker, international aid (private parts)  with your normal soap.             6.  Wash thoroughly, paying special attention to the area where your    surgery  will be performed.  7.  Thoroughly rinse your body with warm water from the neck down.  8.  DO NOT shower/wash with your normal soap after using and rinsing off the CHG Soap.                9.  Pat yourself dry with a clean towel.            10.  Wear clean pajamas.            11.  Place clean sheets on your bed the night of your first shower and do not  sleep with pets. Day of Surgery : Do not apply any lotions/deodorants the morning of surgery.  Please wear clean clothes to the hospital/surgery center.  FAILURE TO FOLLOW THESE INSTRUCTIONS MAY RESULT IN THE CANCELLATION OF YOUR SURGERY  PATIENT SIGNATURE_________________________________  NURSE  SIGNATURE__________________________________  ________________________________________________________________________     Adam Phenix  An incentive spirometer is a tool that can help keep your lungs clear and active. This tool measures how well you are filling your lungs with each breath. Taking long deep breaths may help reverse or decrease the chance of developing breathing (pulmonary) problems (especially infection) following: A long period of time when you are unable to move or be active. BEFORE THE PROCEDURE  If the spirometer includes an indicator to show your best effort, your nurse or respiratory therapist will set it to a desired goal. If possible, sit up straight or lean slightly forward. Try not to slouch. Hold the incentive spirometer in an upright position. INSTRUCTIONS FOR USE  Sit on the edge of your bed if possible, or sit up as far as you can in bed or on a chair. Hold the incentive spirometer in an upright position. Breathe out normally. Place the mouthpiece in your mouth and seal your lips tightly around it. Breathe in slowly and as deeply as possible, raising the piston or the ball toward the top of the column. Hold your breath for 3-5 seconds or for as long as possible. Allow the piston or ball to fall to the bottom of the column. Remove the mouthpiece from your mouth and breathe out normally. Rest for a few seconds and repeat Steps 1 through 7 at least 10 times every 1-2 hours when you are awake. Take your time and take a few normal breaths between deep breaths. The spirometer may include an indicator to show your best effort. Use the indicator as a goal to work toward during each repetition. After each set of 10 deep breaths, practice coughing to be sure your lungs are clear. If you have an incision (the cut made at the time of surgery), support your incision when coughing by placing a pillow or rolled up towels firmly against it. Once you are able to get out  of bed, walk around indoors and cough well. You may stop using the incentive spirometer when instructed by your caregiver.  RISKS AND COMPLICATIONS Take your time so you do not get dizzy or light-headed. If you are in pain, you may need to take or ask for pain medication before doing incentive spirometry. It is harder to take a deep breath if you are having pain. AFTER USE Rest and breathe slowly and easily. It can be helpful to keep track of  a log of your progress. Your caregiver can provide you with a simple table to help with this. If you are using the spirometer at home, follow these instructions: [place] IF:  You are having difficultly using the spirometer. You have trouble using the spirometer as often as instructed. Your pain medication is not giving enough relief while using the spirometer. You develop fever of 100.5 F (38.1 C) or higher. SEEK IMMEDIATE MEDICAL CARE IF:  You cough up bloody sputum that had not been present before. You develop fever of 102 F (38.9 C) or greater. You develop worsening pain at or near the incision site. MAKE SURE YOU:  Understand these instructions. Will watch your condition. Will get help right away if you are not doing well or get worse. Document Released: 09/01/2006 Document Revised: 07/14/2011 Document Reviewed: 11/02/2006 ExitCare Patient Information 2014 Exit[employer], [place].   ________________________________________________________________________  WHAT IS A BLOOD TRANSFUSION? Blood Transfusion Information  A transfusion is the replacement of blood or some of its parts. Blood is made up of multiple cells which provide different functions. Red blood cells carry oxygen and are used for blood loss replacement. White blood cells fight against infection. Platelets control bleeding. Plasma helps clot blood. Other blood products are available for specialized needs, such as hemophilia or other clotting disorders. BEFORE THE TRANSFUSION   Who gives blood for transfusions?  Healthy volunteers who are fully evaluated to make sure their blood is safe. This is blood bank blood. Transfusion therapy is the safest it has ever been in the practice of medicine. Before blood is taken from a donor, a complete history is taken to make sure that person has no history of diseases nor engages in risky social behavior (examples are intravenous drug use or sexual activity with multiple partners). The donor's travel history is screened to minimize risk of transmitting infections, such as malaria. The donated blood is tested for signs of infectious diseases, such as HIV and hepatitis. The blood is then tested to be sure it is compatible with you in order to minimize the chance of a transfusion reaction. If you or a relative donates blood, this is often done in anticipation of surgery and is not appropriate for emergency situations. It takes many days to process the donated blood. RISKS AND COMPLICATIONS Although transfusion therapy is very safe and saves many lives, the main dangers of transfusion include:  Getting an infectious disease. Developing a transfusion reaction. This is an allergic reaction to something in the blood you were given. Every precaution is taken to prevent this. The decision to have a blood transfusion has been considered carefully by your caregiver before blood is given. Blood is not given unless the benefits outweigh the risks. AFTER THE TRANSFUSION Right after receiving a blood transfusion, you will usually feel much better and more energetic. This is especially true if your red blood cells have gotten low (anemic). The transfusion raises the level of the red blood cells which carry oxygen, and this usually causes an energy increase. The nurse administering the transfusion will monitor you carefully for complications. HOME CARE INSTRUCTIONS  No special instructions are needed after a transfusion. You may find your energy is  better. Speak with your caregiver about any limitations on activity for underlying diseases you may have. SEEK MEDICAL CARE IF:  Your condition is not improving after your transfusion. You develop redness or irritation at the intravenous (IV) site. SEEK IMMEDIATE MEDICAL CARE IF:  Any of the following symptoms occur  over the next 12 hours: Shaking chills. You have a temperature by mouth above 102 F (38.9 C), not controlled by medicine. Chest, back, or muscle pain. People around you feel you are not acting correctly or are confused. Shortness of breath or difficulty breathing. Dizziness and fainting. You get a rash or develop hives. You have a decrease in urine output. Your urine turns a dark color or changes to pink, red, or brown. Any of the following symptoms occur over the next 10 days: You have a temperature by mouth above 102 F (38.9 C), not controlled by medicine. Shortness of breath. Weakness after normal activity. The white part of the eye turns yellow (jaundice). You have a decrease in the amount of urine or are urinating less often. Your urine turns a dark color or changes to pink, red, or brown. Document Released: 04/18/2000 Document Revised: 07/14/2011 Document Reviewed: 12/06/2007 [facility] Patient Information 2014 [place], [place].  _______________________________________________________________________

## 2021-09-18 NOTE — Patient Instructions (Signed)
Intermediate risk for prolonged mech ventilation and/or post op pulmonary complications d.t hx moderate OSA and asthma ? ?Recommendations: ?- Continue trelegy 270mg one puff daily in the morning ?- Use albuterol 2 puffs every 4-6 hours for breakthrough shortness of breath or wheezing  ?- Make sure you have CXR tomorrow for pre-op testing ?- Please follow-up with PCP or sleep doctor regarding your sleep apnea, you should be wearing your CPAP every night  ?- After surgery make sure to use incentive spirometry and early ambulation ? ?Follow-up: ?- 6 months with Dr. DErin Fullingor sooner if needed  ? ?

## 2021-09-18 NOTE — Assessment & Plan Note (Signed)
-   Stable; Continue Trelegy 266mg one puff daily  ?

## 2021-09-19 ENCOUNTER — Other Ambulatory Visit: Payer: Self-pay

## 2021-09-19 ENCOUNTER — Encounter (HOSPITAL_COMMUNITY): Payer: Self-pay

## 2021-09-19 ENCOUNTER — Ambulatory Visit (HOSPITAL_COMMUNITY)
Admission: RE | Admit: 2021-09-19 | Discharge: 2021-09-19 | Disposition: A | Discharge status: Home / Self Care | Payer: Medicare (Managed Care) | Source: Ambulatory Visit | Attending: Orthopedic Surgery | Admitting: Orthopedic Surgery

## 2021-09-19 ENCOUNTER — Encounter (HOSPITAL_COMMUNITY)
Admission: RE | Admit: 2021-09-19 | Discharge: 2021-09-19 | Disposition: A | Discharge status: Home / Self Care | Payer: Medicare (Managed Care) | Source: Ambulatory Visit | Attending: Orthopedic Surgery | Admitting: Orthopedic Surgery

## 2021-09-19 DIAGNOSIS — I10 Essential (primary) hypertension: Secondary | ICD-10-CM | POA: Insufficient documentation

## 2021-09-19 DIAGNOSIS — Z01818 Encounter for other preprocedural examination: Secondary | ICD-10-CM | POA: Insufficient documentation

## 2021-09-19 HISTORY — DX: Anemia, unspecified: D64.9

## 2021-09-19 HISTORY — DX: Dyspnea, unspecified: R06.00

## 2021-09-19 HISTORY — DX: Pneumonia, unspecified organism: J18.9

## 2021-09-19 HISTORY — DX: Gastro-esophageal reflux disease without esophagitis: K21.9

## 2021-09-19 LAB — BASIC METABOLIC PANEL
Anion gap: 9 (ref 5–15)
BUN: 19 mg/dL (ref 8–23)
CO2: 26 mmol/L (ref 22–32)
Calcium: 9.3 mg/dL (ref 8.9–10.3)
Chloride: 106 mmol/L (ref 98–111)
Creatinine, Ser: 0.97 mg/dL (ref 0.44–1.00)
GFR, Estimated: >60 mL/min (ref >60)
Glucose, Bld: 93 mg/dL (ref 70–99)
Potassium: 3.9 mmol/L (ref 3.5–5.1)
Sodium: 141 mmol/L (ref 135–145)

## 2021-09-19 LAB — CBC
HCT: 39.9 % (ref 36.0–46.0)
Hemoglobin: 13 g/dL (ref 12.0–15.0)
MCH: 29.5 pg (ref 26.0–34.0)
MCHC: 32.6 g/dL (ref 30.0–36.0)
MCV: 90.7 fL (ref 80.0–100.0)
Platelets: 329 10*3/uL (ref 150–400)
RBC: 4.4 MIL/uL (ref 3.87–5.11)
RDW: 14 % (ref 11.5–15.5)
WBC: 5.7 10*3/uL (ref 4.0–10.5)
nRBC: 0 % (ref 0.0–0.2)

## 2021-09-19 LAB — SURGICAL PCR SCREEN
MRSA, PCR: NEGATIVE
Staphylococcus aureus: NEGATIVE

## 2021-09-19 NOTE — Anesthesia Preprocedure Evaluation (Addendum)
Anesthesia Evaluation  Patient identified by MRN, date of birth, ID band Patient awake    Reviewed: Allergy & Precautions, NPO status , Patient's Chart, lab work & pertinent test results  Airway Mallampati: II  TM Distance: >3 FB Neck ROM: Full    Dental  (+) Poor Dentition, Dental Advisory Given, Missing, Chipped   Pulmonary shortness of breath and with exertion, asthma , sleep apnea (non-compliant w/ CPAP) , Current Smoker and Patient abstained from smoking.,  Current smoker, 23 pack year history, 5cigg/d normally- stopped smoking 2 weeks ago  Per pulm clearance:  "Patient is intermediate risk for prolonged mech ventilation and/or post op pulmonary complications d/t moderate OSA and moderate persistent asthma, she is optimized from pulmonary standpoint for surgery. She does have hx moderate OSA and is not wearing her CPAP. Asthma is well controlled on Trelegy 249mg. No recent exacerbation or acute respiratory symptoms."  Took singulair this AM but did not use trellegy this AM, did not bring it with her   Uses albuterol inhaler/neb about twice a week   Pulmonary exam normal breath sounds clear to auscultation       Cardiovascular hypertension, Pt. on medications Normal cardiovascular exam Rhythm:Regular Rate:Normal  seen by cardiology 09/12/2021 for preoperative evaluation.  Per OV note, "Given her risk factors, she has a 0.9% (hx of several TIAs) risk of MACE. While she is unable to complete a full 4.0 METS, she had a coronary CT study  in 2021 that was negative for heart disease, calcium score zero."    Echo 2019 normal   Neuro/Psych  Headaches, PSYCHIATRIC DISORDERS Anxiety Depression CVA (2019, RLE weakness still), Residual Symptoms    GI/Hepatic Neg liver ROS, GERD  Medicated and Controlled,  Endo/Other  negative endocrine ROSObesity BMI 35  Renal/GU negative Renal ROS  negative genitourinary   Musculoskeletal  (+)  Arthritis , Osteoarthritis,    Abdominal (+) + obese,   Peds  Hematology negative hematology ROS (+) Hb 13, plt 329   Anesthesia Other Findings   Reproductive/Obstetrics negative OB ROS                          Anesthesia Physical Anesthesia Plan  ASA: 3  Anesthesia Plan: Spinal, MAC and Regional   Post-op Pain Management: Regional block* and Tylenol PO (pre-op)*   Induction:   PONV Risk Score and Plan: 2 and Propofol infusion and TIVA  Airway Management Planned: Natural Airway and Nasal Cannula  Additional Equipment: None  Intra-op Plan:   Post-operative Plan:   Informed Consent: I have reviewed the patients History and Physical, chart, labs and discussed the procedure including the risks, benefits and alternatives for the proposed anesthesia with the patient or authorized representative who has indicated his/her understanding and acceptance.     Dental advisory given  Plan Discussed with: CRNA  Anesthesia Plan Comments:       Anesthesia Quick Evaluation

## 2021-09-19 NOTE — Progress Notes (Signed)
Anesthesia Chart Review   Case: [id number] Date/Time: 09/20/21 0948   Procedure: TOTAL KNEE ARTHROPLASTY (Right: Knee)   Anesthesia type: Spinal   Pre-op diagnosis: RIGHT KNEE END STAGE OSTEOARTHRITIS   Location: [facility] ROOM 08 / [facility]   Surgeons: Dorna Leitz, MD       DISCUSSION:60 y.o. smoker with h/o HTN, Stroke, right knee OA scheduled for above procedure 09/20/2021 with Dr. Dorna Leitz.   Pt seen by pulmonology 09/18/2021 for preoperative evaluation. Per OV note, "Patient is intermediate risk for prolonged mech ventilation and/or post op pulmonary complications d.t moderate OSA and moderate persistent asthma, she is optimized from pulmonary standpoint for surgery. She does have hx moderate OSA and is not wearing her CPAP. Asthma is well controlled on Trelegy 254mg. No recent exacerbation or acute respiratory symptoms. Exam today was benign. Needs pre-op CXR which has been ordered by Dr. GBerenice Primas    Major Pulmonary risks identified in the multifactorial risk analysis are but not limited to a) pneumonia; b) recurrent intubation risk; c) prolonged or recurrent acute respiratory failure needing mechanical ventilation; d) prolonged hospitalization; e) DVT/Pulmonary embolism; f) Acute Pulmonary edema   Recommend 1. Short duration of surgery as much as possible and avoid paralytic if possible 2. Recovery in step down or ICU with Pulmonary consultation if needed  3. DVT prophylaxis 4. Aggressive pulmonary toilet with o2, bronchodilatation, and incentive spirometry and early ambulation"  Pt seen by cardiology 09/12/2021 for preoperative evaluation.  Per OV note, "Given her risk factors, she has a 0.9% (hx of several TIAs) risk of MACE. While she is unable to complete a full 4.0 METS, she had a coronary CT study  in 2021 that was negative for heart disease, calcium score zero.    Therefore, based on ACC/AHA guidelines, the patient would be at acceptable risk for the planned procedure without further  cardiovascular testing. "  Anticipate pt can proceed with planned procedure barring acute status change.   VS: BP 122/81   Pulse 91   Temp 37.1 C (Oral)   Resp 20   Ht '5\' 3"'$  (1.6 m)   Wt 88.9 kg   SpO2 99%   BMI 34.72 kg/m   PROVIDERS: RDrue Flirt MD is PCP   DFreda Jackson MD is PColquittCroitoru, MD  LABS: Labs reviewed: Acceptable for surgery. (all labs ordered are listed, but only abnormal results are displayed)  Labs Reviewed  SURGICAL PCR SCREEN  CBC  BASIC METABOLIC PANEL  TYPE AND SCREEN     IMAGES:   EKG: 09/12/21 Rate 91 bpm  NSR  CV: Echo 01/24/2018 Study Conclusions   - Left ventricle: The cavity size was normal. Wall thickness was    normal. Systolic function was normal. The estimated ejection    fraction was in the range of 60% to 65%. Left ventricular    diastolic function parameters were normal.  Past Medical History:  Diagnosis Date   Anemia    Anxiety    Arthritis    Carpal tunnel syndrome    Cataract    [place]    Depression    Dyspnea    Gait abnormality    GERD (gastroesophageal reflux disease)    Hyperlipidemia    Hypertension    Migraine    Osteoarthritis    Pneumonia    Smoker    [place]    Past Surgical History:  Procedure Laterality Date   ABDOMINAL HYSTERECTOMY  CARPAL TUNNEL RELEASE     CATARACT EXTRACTION W/ INTRAOCULAR LENS IMPLANT     hemorrhoid surgery     TUBAL LIGATION      MEDICATIONS:  albuterol (PROVENTIL) (2.5 MG/3ML) 0.083% nebulizer solution   albuterol (VENTOLIN HFA) 108 (90 Base) MCG/ACT inhaler   amitriptyline (ELAVIL) 10 MG tablet   aspirin EC 81 MG tablet   cholecalciferol (VITAMIN D3) 25 MCG (1000 UNIT) tablet   EPINEPHrine 0.3 mg/0.3 mL IJ SOAJ injection   Fluticasone-Umeclidin-Vilant (TRELEGY ELLIPTA) 200-62.5-25 MCG/INH AEPB   gabapentin (NEURONTIN) 300 MG capsule   hydrALAZINE (APRESOLINE) 25 MG tablet    lidocaine 4 %   losartan-hydrochlorothiazide (HYZAAR) 100-25 MG tablet   montelukast (SINGULAIR) 10 MG tablet   omeprazole (PRILOSEC) 20 MG capsule   pyridOXINE (VITAMIN B-6) 50 MG tablet   senna-docusate (SENOKOT-S) 8.6-50 MG tablet   spironolactone (ALDACTONE) 25 MG tablet   topiramate (TOPAMAX) 25 MG tablet   No current facility-administered medications for this encounter.     Konrad Felix Ward, PA-C [facility] [phone]

## 2021-09-20 ENCOUNTER — Ambulatory Visit (HOSPITAL_COMMUNITY): Payer: Medicare (Managed Care) | Admitting: Anesthesiology

## 2021-09-20 ENCOUNTER — Ambulatory Visit (HOSPITAL_COMMUNITY): Payer: Medicare (Managed Care) | Admitting: Physician Assistant

## 2021-09-20 ENCOUNTER — Encounter (HOSPITAL_COMMUNITY): Payer: Self-pay | Admitting: Orthopedic Surgery

## 2021-09-20 ENCOUNTER — Inpatient Hospital Stay (HOSPITAL_COMMUNITY)
Admission: RE | Admit: 2021-09-20 | Discharge: 2021-09-24 | DRG: 470 | Disposition: A | Discharge status: Home Health | Payer: Medicare (Managed Care) | Attending: Orthopedic Surgery | Admitting: Orthopedic Surgery

## 2021-09-20 ENCOUNTER — Encounter (HOSPITAL_COMMUNITY)
Admission: RE | Disposition: A | Discharge status: Skilled Nursing Facility | Payer: Self-pay | Source: Ambulatory Visit | Attending: Orthopedic Surgery

## 2021-09-20 ENCOUNTER — Other Ambulatory Visit: Payer: Self-pay

## 2021-09-20 DIAGNOSIS — K219 Gastro-esophageal reflux disease without esophagitis: Secondary | ICD-10-CM | POA: Diagnosis present

## 2021-09-20 DIAGNOSIS — Z6835 Body mass index (BMI) 35.0-35.9, adult: Secondary | ICD-10-CM

## 2021-09-20 DIAGNOSIS — G4733 Obstructive sleep apnea (adult) (pediatric): Secondary | ICD-10-CM | POA: Diagnosis present

## 2021-09-20 DIAGNOSIS — Z9989 Dependence on other enabling machines and devices: Secondary | ICD-10-CM

## 2021-09-20 DIAGNOSIS — Z90710 Acquired absence of both cervix and uterus: Secondary | ICD-10-CM

## 2021-09-20 DIAGNOSIS — Z91041 Radiographic dye allergy status: Secondary | ICD-10-CM

## 2021-09-20 DIAGNOSIS — G8929 Other chronic pain: Secondary | ICD-10-CM | POA: Diagnosis present

## 2021-09-20 DIAGNOSIS — F17210 Nicotine dependence, cigarettes, uncomplicated: Secondary | ICD-10-CM | POA: Diagnosis present

## 2021-09-20 DIAGNOSIS — M25761 Osteophyte, right knee: Secondary | ICD-10-CM | POA: Diagnosis present

## 2021-09-20 DIAGNOSIS — Z803 Family history of malignant neoplasm of breast: Secondary | ICD-10-CM | POA: Diagnosis not present

## 2021-09-20 DIAGNOSIS — M1711 Unilateral primary osteoarthritis, right knee: Secondary | ICD-10-CM

## 2021-09-20 DIAGNOSIS — I10 Essential (primary) hypertension: Secondary | ICD-10-CM | POA: Diagnosis present

## 2021-09-20 DIAGNOSIS — Z8249 Family history of ischemic heart disease and other diseases of the circulatory system: Secondary | ICD-10-CM

## 2021-09-20 DIAGNOSIS — E7800 Pure hypercholesterolemia, unspecified: Secondary | ICD-10-CM | POA: Diagnosis present

## 2021-09-20 DIAGNOSIS — Z888 Allergy status to other drugs, medicaments and biological substances status: Secondary | ICD-10-CM

## 2021-09-20 DIAGNOSIS — J4540 Moderate persistent asthma, uncomplicated: Secondary | ICD-10-CM | POA: Diagnosis present

## 2021-09-20 DIAGNOSIS — Z01818 Encounter for other preprocedural examination: Secondary | ICD-10-CM

## 2021-09-20 DIAGNOSIS — Z880 Allergy status to penicillin: Secondary | ICD-10-CM

## 2021-09-20 DIAGNOSIS — E669 Obesity, unspecified: Secondary | ICD-10-CM | POA: Diagnosis present

## 2021-09-20 DIAGNOSIS — Z7982 Long term (current) use of aspirin: Secondary | ICD-10-CM | POA: Diagnosis not present

## 2021-09-20 DIAGNOSIS — Z833 Family history of diabetes mellitus: Secondary | ICD-10-CM | POA: Diagnosis not present

## 2021-09-20 DIAGNOSIS — F419 Anxiety disorder, unspecified: Secondary | ICD-10-CM | POA: Diagnosis present

## 2021-09-20 DIAGNOSIS — Z79899 Other long term (current) drug therapy: Secondary | ICD-10-CM

## 2021-09-20 DIAGNOSIS — Z8673 Personal history of transient ischemic attack (TIA), and cerebral infarction without residual deficits: Secondary | ICD-10-CM

## 2021-09-20 DIAGNOSIS — F32A Depression, unspecified: Secondary | ICD-10-CM | POA: Diagnosis present

## 2021-09-20 HISTORY — PX: TOTAL KNEE ARTHROPLASTY: SHX125

## 2021-09-20 LAB — TYPE AND SCREEN
ABO/RH(D): B POS
Antibody Screen: NEGATIVE

## 2021-09-20 LAB — ABO/RH: ABO/RH(D): B POS

## 2021-09-20 SURGERY — ARTHROPLASTY, KNEE, TOTAL
Anesthesia: Monitor Anesthesia Care | Site: Knee | Laterality: Right

## 2021-09-20 MED ORDER — DOCUSATE SODIUM 100 MG PO CAPS: 100.0000 mg | ORAL_CAPSULE | Freq: Two times a day (BID) | ORAL | 0 refills | Status: AC

## 2021-09-20 MED ORDER — MONTELUKAST SODIUM 10 MG PO TABS
10.0000 mg | ORAL_TABLET | Freq: Every day | ORAL | Status: DC
  Administered 2021-09-20 – 2021-09-23 (×4): 10 mg via ORAL
  Filled 2021-09-20 (×4): qty 1

## 2021-09-20 MED ORDER — DEXAMETHASONE SODIUM PHOSPHATE 10 MG/ML IJ SOLN
INTRAMUSCULAR | Status: AC
  Filled 2021-09-20: qty 1

## 2021-09-20 MED ORDER — AMITRIPTYLINE HCL 10 MG PO TABS
10.0000 mg | ORAL_TABLET | Freq: Every day | ORAL | Status: DC
  Administered 2021-09-20 – 2021-09-23 (×4): 10 mg via ORAL
  Filled 2021-09-20 (×4): qty 1

## 2021-09-20 MED ORDER — OXYCODONE-ACETAMINOPHEN 5-325 MG PO TABS: 1.0000 | ORAL_TABLET | ORAL | 0 refills | Status: AC | PRN

## 2021-09-20 MED ORDER — HYDRALAZINE HCL 20 MG/ML IJ SOLN
INTRAMUSCULAR | Status: AC
  Filled 2021-09-20: qty 1

## 2021-09-20 MED ORDER — PHENOL 1.4 % MT LIQD: 1.0000 | OROMUCOSAL | Status: DC | PRN

## 2021-09-20 MED ORDER — ALUM & MAG HYDROXIDE-SIMETH 200-200-20 MG/5ML PO SUSP: 30.0000 mL | ORAL | Status: DC | PRN

## 2021-09-20 MED ORDER — MENTHOL 3 MG MT LOZG: 1.0000 | LOZENGE | OROMUCOSAL | Status: DC | PRN

## 2021-09-20 MED ORDER — GABAPENTIN 300 MG PO CAPS: 300.0000 mg | ORAL_CAPSULE | ORAL | Status: DC

## 2021-09-20 MED ORDER — BUPIVACAINE LIPOSOME 1.3 % IJ SUSP: 20.0000 mL | Freq: Once | INTRAMUSCULAR | Status: DC

## 2021-09-20 MED ORDER — FLUTICASONE FUROATE-VILANTEROL 200-25 MCG/ACT IN AEPB
1.0000 | INHALATION_SPRAY | Freq: Every day | RESPIRATORY_TRACT | Status: DC
  Administered 2021-09-21 – 2021-09-24 (×4): 1 via RESPIRATORY_TRACT
  Filled 2021-09-20: qty 28

## 2021-09-20 MED ORDER — ONDANSETRON HCL 4 MG PO TABS
4.0000 mg | ORAL_TABLET | Freq: Four times a day (QID) | ORAL | Status: DC | PRN
  Administered 2021-09-21 – 2021-09-22 (×3): 4 mg via ORAL
  Filled 2021-09-20 (×3): qty 1

## 2021-09-20 MED ORDER — GABAPENTIN 300 MG PO CAPS
600.0000 mg | ORAL_CAPSULE | Freq: Every day | ORAL | Status: DC
  Administered 2021-09-20 – 2021-09-23 (×4): 600 mg via ORAL
  Filled 2021-09-20 (×4): qty 2

## 2021-09-20 MED ORDER — HYDROMORPHONE HCL 1 MG/ML IJ SOLN: 0.2500 mg | INTRAMUSCULAR | Status: DC | PRN

## 2021-09-20 MED ORDER — ONDANSETRON HCL 4 MG/2ML IJ SOLN: 4.0000 mg | Freq: Once | INTRAMUSCULAR | Status: DC | PRN

## 2021-09-20 MED ORDER — FENTANYL CITRATE PF 50 MCG/ML IJ SOSY
50.0000 ug | PREFILLED_SYRINGE | INTRAMUSCULAR | Status: DC
  Administered 2021-09-20: 50 ug via INTRAVENOUS
  Filled 2021-09-20: qty 2

## 2021-09-20 MED ORDER — FENTANYL CITRATE (PF) 100 MCG/2ML IJ SOLN
INTRAMUSCULAR | Status: DC | PRN
  Administered 2021-09-20 (×2): 50 ug via INTRAVENOUS

## 2021-09-20 MED ORDER — ALBUTEROL SULFATE HFA 108 (90 BASE) MCG/ACT IN AERS: 1.0000 | INHALATION_SPRAY | RESPIRATORY_TRACT | Status: DC | PRN

## 2021-09-20 MED ORDER — SPIRONOLACTONE 25 MG PO TABS
25.0000 mg | ORAL_TABLET | Freq: Every day | ORAL | Status: DC
  Administered 2021-09-20 – 2021-09-23 (×4): 25 mg via ORAL
  Filled 2021-09-20 (×5): qty 1

## 2021-09-20 MED ORDER — AMISULPRIDE (ANTIEMETIC) 5 MG/2ML IV SOLN: 10.0000 mg | Freq: Once | INTRAVENOUS | Status: DC | PRN

## 2021-09-20 MED ORDER — SODIUM CHLORIDE 0.9 % IR SOLN
Status: DC | PRN
  Administered 2021-09-20: 3000 mL

## 2021-09-20 MED ORDER — BUPIVACAINE-EPINEPHRINE (PF) 0.5% -1:200000 IJ SOLN
INTRAMUSCULAR | Status: AC
  Filled 2021-09-20: qty 30

## 2021-09-20 MED ORDER — PROPOFOL 500 MG/50ML IV EMUL
INTRAVENOUS | Status: DC | PRN
  Administered 2021-09-20: 50 ug/kg/min via INTRAVENOUS

## 2021-09-20 MED ORDER — ZOLPIDEM TARTRATE 5 MG PO TABS: 5.0000 mg | ORAL_TABLET | Freq: Every evening | ORAL | Status: DC | PRN

## 2021-09-20 MED ORDER — ONDANSETRON HCL 4 MG/2ML IJ SOLN: 4.0000 mg | Freq: Four times a day (QID) | INTRAMUSCULAR | Status: DC | PRN

## 2021-09-20 MED ORDER — ROPIVACAINE HCL 5 MG/ML IJ SOLN
INTRAMUSCULAR | Status: DC | PRN
  Administered 2021-09-20: 30 mL via PERINEURAL

## 2021-09-20 MED ORDER — UMECLIDINIUM BROMIDE 62.5 MCG/ACT IN AEPB
1.0000 | INHALATION_SPRAY | Freq: Every day | RESPIRATORY_TRACT | Status: DC
Start: 2021-09-21 — End: 2021-09-24
  Administered 2021-09-21 – 2021-09-24 (×4): 1 via RESPIRATORY_TRACT
  Filled 2021-09-20: qty 7

## 2021-09-20 MED ORDER — TRANEXAMIC ACID-NACL 1000-0.7 MG/100ML-% IV SOLN
1000.0000 mg | Freq: Once | INTRAVENOUS | Status: AC
  Administered 2021-09-20: 1000 mg via INTRAVENOUS
  Filled 2021-09-20: qty 100

## 2021-09-20 MED ORDER — MUPIROCIN 2 % EX OINT
1.0000 "application " | TOPICAL_OINTMENT | Freq: Once | CUTANEOUS | Status: AC
  Administered 2021-09-20: 1 via TOPICAL
  Filled 2021-09-20: qty 22

## 2021-09-20 MED ORDER — PROPOFOL 1000 MG/100ML IV EMUL
INTRAVENOUS | Status: AC
  Filled 2021-09-20: qty 100

## 2021-09-20 MED ORDER — ORAL CARE MOUTH RINSE: 15.0000 mL | Freq: Once | OROMUCOSAL | Status: AC

## 2021-09-20 MED ORDER — TRANEXAMIC ACID-NACL 1000-0.7 MG/100ML-% IV SOLN
1000.0000 mg | INTRAVENOUS | Status: AC
  Administered 2021-09-20: 1000 mg via INTRAVENOUS
  Filled 2021-09-20: qty 100

## 2021-09-20 MED ORDER — HYDROCHLOROTHIAZIDE 25 MG PO TABS
25.0000 mg | ORAL_TABLET | Freq: Every day | ORAL | Status: DC
  Administered 2021-09-20 – 2021-09-23 (×4): 25 mg via ORAL
  Filled 2021-09-20 (×4): qty 1

## 2021-09-20 MED ORDER — ACETAMINOPHEN 500 MG PO TABS
1000.0000 mg | ORAL_TABLET | Freq: Once | ORAL | Status: AC
  Administered 2021-09-20: 1000 mg via ORAL
  Filled 2021-09-20: qty 2

## 2021-09-20 MED ORDER — FENTANYL CITRATE (PF) 100 MCG/2ML IJ SOLN
INTRAMUSCULAR | Status: AC
  Filled 2021-09-20: qty 2

## 2021-09-20 MED ORDER — SODIUM CHLORIDE (PF) 0.9 % IJ SOLN
INTRAMUSCULAR | Status: AC
  Filled 2021-09-20: qty 50

## 2021-09-20 MED ORDER — HYDRALAZINE HCL 25 MG PO TABS
25.0000 mg | ORAL_TABLET | Freq: Two times a day (BID) | ORAL | Status: DC
  Administered 2021-09-20 – 2021-09-23 (×7): 25 mg via ORAL
  Filled 2021-09-20 (×7): qty 1

## 2021-09-20 MED ORDER — LACTATED RINGERS IV SOLN: INTRAVENOUS | Status: DC

## 2021-09-20 MED ORDER — POLYETHYLENE GLYCOL 3350 17 G PO PACK: 17.0000 g | PACK | Freq: Every day | ORAL | Status: DC | PRN

## 2021-09-20 MED ORDER — ASPIRIN 325 MG PO TBEC: 325.0000 mg | DELAYED_RELEASE_TABLET | Freq: Two times a day (BID) | ORAL | 0 refills | Status: DC

## 2021-09-20 MED ORDER — HYDRALAZINE HCL 20 MG/ML IJ SOLN
INTRAMUSCULAR | Status: DC | PRN
  Administered 2021-09-20: 2 mg via INTRAVENOUS
  Administered 2021-09-20: 2.5 mg via INTRAVENOUS

## 2021-09-20 MED ORDER — HYDROMORPHONE HCL 1 MG/ML IJ SOLN
0.5000 mg | INTRAMUSCULAR | Status: DC | PRN
  Administered 2021-09-20 – 2021-09-21 (×2): 1 mg via INTRAVENOUS
  Filled 2021-09-20 (×2): qty 1

## 2021-09-20 MED ORDER — BUPIVACAINE LIPOSOME 1.3 % IJ SUSP
INTRAMUSCULAR | Status: DC | PRN
  Administered 2021-09-20: 20 mL

## 2021-09-20 MED ORDER — VANCOMYCIN HCL IN DEXTROSE 1-5 GM/200ML-% IV SOLN
1000.0000 mg | INTRAVENOUS | Status: AC
  Administered 2021-09-20: 1000 mg via INTRAVENOUS
  Filled 2021-09-20: qty 200

## 2021-09-20 MED ORDER — TIZANIDINE HCL 4 MG PO TABS: 4.0000 mg | ORAL_TABLET | Freq: Three times a day (TID) | ORAL | 0 refills | Status: AC | PRN

## 2021-09-20 MED ORDER — OXYCODONE HCL 5 MG PO TABS: 5.0000 mg | ORAL_TABLET | Freq: Once | ORAL | Status: DC | PRN

## 2021-09-20 MED ORDER — OXYCODONE HCL 5 MG PO TABS
5.0000 mg | ORAL_TABLET | ORAL | Status: DC | PRN
  Administered 2021-09-20 – 2021-09-21 (×7): 10 mg via ORAL
  Administered 2021-09-22 (×2): 5 mg via ORAL
  Administered 2021-09-22: 10 mg via ORAL
  Administered 2021-09-22: 5 mg via ORAL
  Administered 2021-09-23 (×4): 10 mg via ORAL
  Administered 2021-09-24: 5 mg via ORAL
  Administered 2021-09-24: 10 mg via ORAL
  Filled 2021-09-20 (×5): qty 2
  Filled 2021-09-20: qty 1
  Filled 2021-09-20 (×2): qty 2
  Filled 2021-09-20: qty 1
  Filled 2021-09-20 (×2): qty 2
  Filled 2021-09-20: qty 1
  Filled 2021-09-20 (×3): qty 2
  Filled 2021-09-20 (×4): qty 1
  Filled 2021-09-20: qty 2

## 2021-09-20 MED ORDER — LOSARTAN POTASSIUM-HCTZ 100-25 MG PO TABS: 1.0000 | ORAL_TABLET | Freq: Every day | ORAL | Status: DC

## 2021-09-20 MED ORDER — GABAPENTIN 300 MG PO CAPS
300.0000 mg | ORAL_CAPSULE | Freq: Every morning | ORAL | Status: DC
  Administered 2021-09-21 – 2021-09-24 (×4): 300 mg via ORAL
  Filled 2021-09-20 (×4): qty 1

## 2021-09-20 MED ORDER — SODIUM CHLORIDE 0.9% FLUSH
INTRAVENOUS | Status: DC | PRN
Start: 2021-09-20 — End: 2021-09-20
  Administered 2021-09-20: 50 mL

## 2021-09-20 MED ORDER — ONDANSETRON HCL 4 MG/2ML IJ SOLN
INTRAMUSCULAR | Status: AC
  Filled 2021-09-20: qty 2

## 2021-09-20 MED ORDER — VITAMIN D 25 MCG (1000 UNIT) PO TABS
1000.0000 [IU] | ORAL_TABLET | Freq: Every day | ORAL | Status: DC
  Administered 2021-09-20 – 2021-09-24 (×5): 1000 [IU] via ORAL
  Filled 2021-09-20 (×5): qty 1

## 2021-09-20 MED ORDER — OXYCODONE HCL 5 MG/5ML PO SOLN: 5.0000 mg | Freq: Once | ORAL | Status: DC | PRN

## 2021-09-20 MED ORDER — METHOCARBAMOL 1000 MG/10ML IJ SOLN
500.0000 mg | Freq: Four times a day (QID) | INTRAVENOUS | Status: DC | PRN
  Filled 2021-09-20: qty 5

## 2021-09-20 MED ORDER — METHOCARBAMOL 500 MG PO TABS
500.0000 mg | ORAL_TABLET | Freq: Four times a day (QID) | ORAL | Status: DC | PRN
  Administered 2021-09-20 – 2021-09-24 (×9): 500 mg via ORAL
  Filled 2021-09-20 (×10): qty 1

## 2021-09-20 MED ORDER — ASPIRIN 325 MG PO TBEC: 325.0000 mg | DELAYED_RELEASE_TABLET | Freq: Two times a day (BID) | ORAL | 0 refills | Status: AC

## 2021-09-20 MED ORDER — CELECOXIB 200 MG PO CAPS
200.0000 mg | ORAL_CAPSULE | Freq: Two times a day (BID) | ORAL | Status: DC
  Administered 2021-09-20 – 2021-09-24 (×9): 200 mg via ORAL
  Filled 2021-09-20 (×9): qty 1

## 2021-09-20 MED ORDER — LIDOCAINE HCL (CARDIAC) PF 100 MG/5ML IV SOSY
PREFILLED_SYRINGE | INTRAVENOUS | Status: DC | PRN
  Administered 2021-09-20: 20 mg via INTRAVENOUS

## 2021-09-20 MED ORDER — ACETAMINOPHEN 325 MG PO TABS
325.0000 mg | ORAL_TABLET | Freq: Four times a day (QID) | ORAL | Status: DC | PRN
  Administered 2021-09-23: 650 mg via ORAL
  Filled 2021-09-20: qty 2

## 2021-09-20 MED ORDER — SODIUM CHLORIDE 0.9 % IV SOLN: INTRAVENOUS | Status: DC

## 2021-09-20 MED ORDER — MIDAZOLAM HCL 2 MG/2ML IJ SOLN
1.0000 mg | INTRAMUSCULAR | Status: DC
  Filled 2021-09-20: qty 2

## 2021-09-20 MED ORDER — DEXAMETHASONE SODIUM PHOSPHATE 10 MG/ML IJ SOLN
INTRAMUSCULAR | Status: DC | PRN
  Administered 2021-09-20: 10 mg via INTRAVENOUS
  Administered 2021-09-20: 4 mg via INTRAVENOUS

## 2021-09-20 MED ORDER — ALBUTEROL SULFATE (2.5 MG/3ML) 0.083% IN NEBU: 2.5000 mg | INHALATION_SOLUTION | Freq: Four times a day (QID) | RESPIRATORY_TRACT | Status: DC | PRN

## 2021-09-20 MED ORDER — BUPIVACAINE LIPOSOME 1.3 % IJ SUSP
INTRAMUSCULAR | Status: AC
Start: 2021-09-20 — End: ?
  Filled 2021-09-20: qty 20

## 2021-09-20 MED ORDER — WATER FOR IRRIGATION, STERILE IR SOLN
Status: DC | PRN
  Administered 2021-09-20: 2000 mL

## 2021-09-20 MED ORDER — PANTOPRAZOLE SODIUM 40 MG PO TBEC
40.0000 mg | DELAYED_RELEASE_TABLET | Freq: Every day | ORAL | Status: DC
  Administered 2021-09-21 – 2021-09-23 (×3): 40 mg via ORAL
  Filled 2021-09-20 (×4): qty 1

## 2021-09-20 MED ORDER — MIDAZOLAM HCL 2 MG/2ML IJ SOLN
INTRAMUSCULAR | Status: AC
  Filled 2021-09-20: qty 2

## 2021-09-20 MED ORDER — SENNOSIDES-DOCUSATE SODIUM 8.6-50 MG PO TABS
1.0000 | ORAL_TABLET | Freq: Every day | ORAL | Status: DC
  Administered 2021-09-20 – 2021-09-22 (×3): 1 via ORAL
  Filled 2021-09-20 (×4): qty 1

## 2021-09-20 MED ORDER — POVIDONE-IODINE 10 % EX SWAB: 2.0000 "application " | Freq: Once | CUTANEOUS | Status: DC

## 2021-09-20 MED ORDER — BISACODYL 5 MG PO TBEC: 5.0000 mg | DELAYED_RELEASE_TABLET | Freq: Every day | ORAL | Status: DC | PRN

## 2021-09-20 MED ORDER — 0.9 % SODIUM CHLORIDE (POUR BTL) OPTIME
TOPICAL | Status: DC | PRN
  Administered 2021-09-20: 1000 mL

## 2021-09-20 MED ORDER — ASPIRIN 325 MG PO TBEC
325.0000 mg | DELAYED_RELEASE_TABLET | Freq: Two times a day (BID) | ORAL | Status: DC
  Administered 2021-09-20 – 2021-09-24 (×8): 325 mg via ORAL
  Filled 2021-09-20 (×7): qty 1

## 2021-09-20 MED ORDER — TIZANIDINE HCL 2 MG PO TABS: 2.0000 mg | ORAL_TABLET | Freq: Three times a day (TID) | ORAL | 0 refills | Status: AC | PRN

## 2021-09-20 MED ORDER — BUPIVACAINE IN DEXTROSE 0.75-8.25 % IT SOLN
INTRATHECAL | Status: DC | PRN
  Administered 2021-09-20: 1.6 mL via INTRATHECAL

## 2021-09-20 MED ORDER — LIDOCAINE HCL (PF) 2 % IJ SOLN
INTRAMUSCULAR | Status: AC
  Filled 2021-09-20: qty 5

## 2021-09-20 MED ORDER — VANCOMYCIN HCL IN DEXTROSE 1-5 GM/200ML-% IV SOLN
1000.0000 mg | Freq: Two times a day (BID) | INTRAVENOUS | Status: AC
  Administered 2021-09-20: 1000 mg via INTRAVENOUS
  Filled 2021-09-20: qty 200

## 2021-09-20 MED ORDER — DEXAMETHASONE SODIUM PHOSPHATE 10 MG/ML IJ SOLN
10.0000 mg | Freq: Two times a day (BID) | INTRAMUSCULAR | Status: AC
  Administered 2021-09-20 – 2021-09-21 (×3): 10 mg via INTRAVENOUS
  Filled 2021-09-20 (×3): qty 1

## 2021-09-20 MED ORDER — TOPIRAMATE 25 MG PO TABS: 25.0000 mg | ORAL_TABLET | Freq: Every day | ORAL | Status: DC | PRN

## 2021-09-20 MED ORDER — LOSARTAN POTASSIUM 50 MG PO TABS
100.0000 mg | ORAL_TABLET | Freq: Every day | ORAL | Status: DC
  Administered 2021-09-20 – 2021-09-23 (×4): 100 mg via ORAL
  Filled 2021-09-20 (×4): qty 2

## 2021-09-20 MED ORDER — DIPHENHYDRAMINE HCL 12.5 MG/5ML PO ELIX: 12.5000 mg | ORAL_SOLUTION | ORAL | Status: DC | PRN

## 2021-09-20 MED ORDER — CHLORHEXIDINE GLUCONATE 0.12 % MT SOLN
15.0000 mL | Freq: Once | OROMUCOSAL | Status: AC
  Administered 2021-09-20: 15 mL via OROMUCOSAL

## 2021-09-20 MED ORDER — ONDANSETRON HCL 4 MG/2ML IJ SOLN
INTRAMUSCULAR | Status: DC | PRN
  Administered 2021-09-20: 4 mg via INTRAVENOUS

## 2021-09-20 MED ORDER — MIDAZOLAM HCL 5 MG/5ML IJ SOLN
INTRAMUSCULAR | Status: DC | PRN
  Administered 2021-09-20 (×2): 1 mg via INTRAVENOUS

## 2021-09-20 MED ORDER — DOCUSATE SODIUM 100 MG PO CAPS
100.0000 mg | ORAL_CAPSULE | Freq: Two times a day (BID) | ORAL | Status: DC
  Administered 2021-09-20 – 2021-09-22 (×6): 100 mg via ORAL
  Filled 2021-09-20 (×8): qty 1

## 2021-09-20 MED ORDER — BUPIVACAINE-EPINEPHRINE 0.5% -1:200000 IJ SOLN
INTRAMUSCULAR | Status: DC | PRN
  Administered 2021-09-20: 30 mL

## 2021-09-20 SURGICAL SUPPLY — 49 items
ATTUNE MED DOME PAT 32 KNEE (Knees) ×1 IMPLANT
ATTUNE PSFEM RTSZ3 NARCEM KNEE (Femur) ×1 IMPLANT
ATTUNE PSRP INSR SZ3 5 KNEE (Insert) ×1 IMPLANT
BAG COUNTER SPONGE SURGICOUNT (BAG) ×1 IMPLANT
BAG ZIPLOCK 12X15 (MISCELLANEOUS) ×2 IMPLANT
BASE TIBIAL ROT PLAT SZ 3 KNEE (Knees) IMPLANT
BENZOIN TINCTURE PRP APPL 2/3 (GAUZE/BANDAGES/DRESSINGS) ×2 IMPLANT
BLADE SAGITTAL 25.0X1.19X90 (BLADE) ×2 IMPLANT
BLADE SAW SGTL 13.0X1.19X90.0M (BLADE) ×2 IMPLANT
BLADE SURG SZ10 CARB STEEL (BLADE) ×4 IMPLANT
BNDG ELASTIC 6X5.8 VLCR STR LF (GAUZE/BANDAGES/DRESSINGS) ×2 IMPLANT
BOOTIES KNEE HIGH SLOAN (MISCELLANEOUS) ×1 IMPLANT
BOWL SMART MIX CTS (DISPOSABLE) ×2 IMPLANT
CEMENT HV SMART SET (Cement) ×3 IMPLANT
COVER SURGICAL LIGHT HANDLE (MISCELLANEOUS) ×2 IMPLANT
CUFF TOURN SGL QUICK 34 (TOURNIQUET CUFF) ×1
CUFF TRNQT CYL 34X4.125X (TOURNIQUET CUFF) ×1 IMPLANT
DRAPE INCISE IOBAN 66X45 STRL (DRAPES) ×2 IMPLANT
DRAPE U-SHAPE 47X51 STRL (DRAPES) ×2 IMPLANT
DRSG AQUACEL AG ADV 3.5X10 (GAUZE/BANDAGES/DRESSINGS) ×2 IMPLANT
DURAPREP 26ML APPLICATOR (WOUND CARE) ×2 IMPLANT
ELECT REM PT RETURN 15FT ADLT (MISCELLANEOUS) ×2 IMPLANT
GLOVE BIOGEL PI IND STRL 8 (GLOVE) ×2 IMPLANT
GLOVE BIOGEL PI INDICATOR 8 (GLOVE) ×2
GLOVE ECLIPSE 7.5 STRL STRAW (GLOVE) ×4 IMPLANT
GOWN STRL REUS W/ TWL XL LVL3 (GOWN DISPOSABLE) ×2 IMPLANT
GOWN STRL REUS W/TWL XL LVL3 (GOWN DISPOSABLE) ×2
HANDPIECE INTERPULSE COAX TIP (DISPOSABLE) ×1
HOLDER FOLEY CATH W/STRAP (MISCELLANEOUS) ×1 IMPLANT
HOOD PEEL AWAY FLYTE STAYCOOL (MISCELLANEOUS) ×6 IMPLANT
KIT TURNOVER KIT A (KITS) ×1 IMPLANT
MANIFOLD NEPTUNE II (INSTRUMENTS) ×2 IMPLANT
NEEDLE HYPO 22GX1.5 SAFETY (NEEDLE) ×4 IMPLANT
NS IRRIG 1000ML POUR BTL (IV SOLUTION) ×2 IMPLANT
PACK TOTAL KNEE CUSTOM (KITS) ×2 IMPLANT
PADDING CAST COTTON 6X4 STRL (CAST SUPPLIES) ×2 IMPLANT
PROTECTOR NERVE ULNAR (MISCELLANEOUS) ×2 IMPLANT
SET HNDPC FAN SPRY TIP SCT (DISPOSABLE) ×1 IMPLANT
SPIKE FLUID TRANSFER (MISCELLANEOUS) ×3 IMPLANT
STRIP CLOSURE SKIN 1/2X4 (GAUZE/BANDAGES/DRESSINGS) ×1 IMPLANT
SUT MNCRL AB 3-0 PS2 18 (SUTURE) ×2 IMPLANT
SUT VIC AB 0 CT1 36 (SUTURE) ×2 IMPLANT
SUT VIC AB 1 CT1 36 (SUTURE) ×4 IMPLANT
SYR CONTROL 10ML LL (SYRINGE) ×4 IMPLANT
TIBIAL BASE ROT PLAT SZ 3 KNEE (Knees) ×2 IMPLANT
TRAY FOLEY MTR SLVR 14FR STAT (SET/KITS/TRAYS/PACK) ×1 IMPLANT
TRAY FOLEY MTR SLVR 16FR STAT (SET/KITS/TRAYS/PACK) ×1 IMPLANT
WATER STERILE IRR 1000ML POUR (IV SOLUTION) ×4 IMPLANT
WRAP KNEE MAXI GEL POST OP (GAUZE/BANDAGES/DRESSINGS) ×2 IMPLANT

## 2021-09-20 NOTE — Transfer of Care (Signed)
Immediate Anesthesia Transfer of Care Note  Patient: Wendy Leblanc  Procedure(s) Performed: TOTAL KNEE ARTHROPLASTY (Right: Knee)  Patient Location: PACU  Anesthesia Type:Spinal and MAC combined with regional for post-op pain  Level of Consciousness: drowsy and patient cooperative  Airway & Oxygen Therapy: Patient Spontanous Breathing and Patient connected to face mask oxygen  Post-op Assessment: Report given to RN and Post -op Vital signs reviewed and stable  Post vital signs: Reviewed and stable  Last Vitals:  Vitals Value Taken Time  BP 124/75 09/20/21 1230  Temp    Pulse 71 09/20/21 1231  Resp 8 09/20/21 1231  SpO2 100 % 09/20/21 1231  Vitals shown include unvalidated device data.  Last Pain:  Vitals:   09/20/21 0845  TempSrc:   PainSc: 0-No pain      Patients Stated Pain Goal: 3 (23/41/44 3601)  Complications: No notable events documented.

## 2021-09-20 NOTE — Discharge Instructions (Signed)

## 2021-09-20 NOTE — H&P (Signed)
TOTAL KNEE ADMISSION H&P  Patient is being admitted for right total knee arthroplasty.  Subjective:  Chief Complaint:right knee pain.  HPI: Wendy Leblanc, 61 y.o. female, has a history of pain and functional disability in the right knee due to arthritis and has failed non-surgical conservative treatments for greater than 12 weeks to includeNSAID's and/or analgesics, corticosteriod injections, viscosupplementation injections, weight reduction as appropriate, and activity modification.  Onset of symptoms was gradual, starting 5 years ago with gradually worsening course since that time. The patient noted no past surgery on the right knee(s).  Patient currently rates pain in the right knee(s) at 9 out of 10 with activity. Patient has night pain, worsening of pain with activity and weight bearing, pain that interferes with activities of daily living, pain with passive range of motion, and joint swelling.  Patient has evidence of subchondral sclerosis, periarticular osteophytes, and joint space narrowing by imaging studies. This patient has had  failure of all reasonable conservative care . There is no active infection.  Patient Active Problem List   Diagnosis Date Noted   Moderate persistent asthma 09/18/2021   Pre-operative respiratory examination 09/18/2021   Cervical stenosis of spinal canal 12/06/2019   Arm weakness 12/06/2019   Carpal tunnel syndrome, bilateral 12/06/2019   Dysesthesia 12/06/2019   Neck pain 12/06/2019   OSA (obstructive sleep apnea) 12/06/2019   Hypercholesterolemia 11/27/2019   Severe obesity (BMI 35.0-39.9) with comorbidity ([place]) 11/27/2019   Smoking 11/27/2019   Intermittent claudication ([place]) 11/27/2019   TIA (transient ischemic attack) 01/24/2018   Essential hypertension 01/24/2018   Depression 01/24/2018   Past Medical History:  Diagnosis Date   Anemia    Anxiety    Arthritis    Carpal tunnel syndrome    Cataract    CVA (cerebral vascular accident)  ([place])    Depression    Dyspnea    Gait abnormality    GERD (gastroesophageal reflux disease)    Hyperlipidemia    Hypertension    Migraine    Osteoarthritis    Pneumonia    Smoker    Stroke ([place]) 2019    Past Surgical History:  Procedure Laterality Date   ABDOMINAL HYSTERECTOMY     CARPAL TUNNEL RELEASE     CATARACT EXTRACTION W/ INTRAOCULAR LENS IMPLANT     hemorrhoid surgery     TUBAL LIGATION      No current facility-administered medications for this encounter.   Current Outpatient Medications  Medication Sig Dispense Refill Last Dose   albuterol (PROVENTIL) (2.5 MG/3ML) 0.083% nebulizer solution Take 3 mLs (2.5 mg total) by nebulization every 6 (six) hours as needed for wheezing or shortness of breath. 75 mL 12    albuterol (VENTOLIN HFA) 108 (90 Base) MCG/ACT inhaler Inhale 1-2 puffs into the lungs every 4 (four) hours as needed for wheezing or shortness of breath. 8 g 11    amitriptyline (ELAVIL) 10 MG tablet Take 10 mg by mouth at bedtime.      aspirin EC 81 MG tablet Take 81 mg by mouth daily.      cholecalciferol (VITAMIN D3) 25 MCG (1000 UNIT) tablet Take 1,000 Units by mouth daily.      EPINEPHrine 0.3 mg/0.3 mL IJ SOAJ injection Inject 0.3 mg into the muscle as needed for anaphylaxis. 1 each 0    Fluticasone-Umeclidin-Vilant (TRELEGY ELLIPTA) 200-62.5-25 MCG/INH AEPB Inhale 1 puff into the lungs daily. 60 each 6    gabapentin (NEURONTIN) 300 MG capsule TAKE 1 CAPSULE BY MOUTH DAILY  IN THE MORNING, 1 TABLET DAILY IN THE EVENING AND 2 TABLETS AT BEDTIME (Patient taking differently: Take 300-600 mg by mouth See admin instructions. Take 300 mg in the morning and 600 mg in the evening) 120 capsule 5    hydrALAZINE (APRESOLINE) 25 MG tablet Take 25 mg by mouth in the morning and at bedtime.      lidocaine 4 % Place 1 patch onto the skin daily as needed (pain).      losartan-hydrochlorothiazide (HYZAAR) 100-25 MG tablet Take 1 tablet by mouth daily.      montelukast  (SINGULAIR) 10 MG tablet Take 1 tablet (10 mg total) by mouth at bedtime. 30 tablet 11    omeprazole (PRILOSEC) 20 MG capsule Take 20 mg by mouth daily.      pyridOXINE (VITAMIN B-6) 50 MG tablet Take 50 mg by mouth daily.      senna-docusate (SENOKOT-S) 8.6-50 MG tablet Take 1 tablet by mouth at bedtime.      spironolactone (ALDACTONE) 25 MG tablet Take 25 mg by mouth daily.      topiramate (TOPAMAX) 25 MG tablet Take 25 mg by mouth daily as needed (migraines).  0    Allergies  Allergen Reactions   Penicillins Anaphylaxis   Amlodipine Hives and Swelling    Lip swelling   Contrast Media [Iodinated Contrast Media] Hives   Elemental Sulfur Hives   Lisinopril Swelling    Social History   Tobacco Use   Smoking status: Every Day    Packs/day: 0.50    Years: 45.00    Pack years: 22.50    Types: Cigarettes   Smokeless tobacco: Never   Tobacco comments:    down to 1/4 ppd 06/15/20  Substance Use Topics   Alcohol use: Not Currently    Family History  Problem Relation Age of Onset   Breast cancer Mother    Heart disease Mother    Breast cancer Sister    Diabetes Brother    Diabetes Sister    Colon cancer Neg Hx    Esophageal cancer Neg Hx    Rectal cancer Neg Hx    Stomach cancer Neg Hx    Colon polyps Neg Hx      Review of Systems ROS: I have reviewed the patient's review of systems thoroughly and there are no positive responses as relates to the HPI.  Objective:  Physical Exam  Vital signs in last 24 hours: Temp:  [98.8 F (37.1 C)] 98.8 F (37.1 C) (05/18 1018) Pulse Rate:  [91] 91 (05/18 1018) Resp:  [20] 20 (05/18 1018) BP: (122)/(81) 122/81 (05/18 1018) SpO2:  [99 %] 99 % (05/18 1018) Weight:  [88.9 kg] 88.9 kg (05/18 1018) Well-developed well-nourished patient in no acute distress. Alert and oriented x3 HEENT:within normal limits Cardiac: Regular rate and rhythm Pulmonary: Lungs clear to auscultation Abdomen: Soft and nontender.  Normal active bowel  sounds  Musculoskeletal: Right knee: Painful range of motion.  Limited range of motion.  No instability.  Trace effusion.  Neurovascular intact distally. Labs: Recent Results (from the past 2160 hour(s))  Surgical pcr screen     Status: None   Collection Time: 09/19/21 10:46 AM   Specimen: Nasal Mucosa; Nasal Swab  Result Value Ref Range   MRSA, PCR NEGATIVE NEGATIVE   Staphylococcus aureus NEGATIVE NEGATIVE    Comment: (NOTE) The Xpert SA Assay (FDA approved for NASAL specimens in patients 59 years of age and older), is one component of a comprehensive surveillance program.  It is not intended to diagnose infection nor to guide or monitor treatment. Performed at [facility], [place] [address] [place], [place] [address]   Type and screen Order type and screen if day of surgery is less than 15 days from draw of preadmission visit or order morning of surgery if day of surgery is greater than 6 days from preadmission visit.     Status: None   Collection Time: 09/19/21 10:46 AM  Result Value Ref Range   ABO/RH(D) B POS    Antibody Screen NEG    Sample Expiration 10/03/2021,2359    Extend sample reason      NO TRANSFUSIONS OR PREGNANCY IN THE PAST 3 MONTHS Performed at [facility], [place] [address], [place], [place] [address]   CBC per protocol     Status: None   Collection Time: 09/19/21 10:46 AM  Result Value Ref Range   WBC 5.7 4.0 - 10.5 K/uL   RBC 4.40 3.87 - 5.11 MIL/uL   Hemoglobin 13.0 12.0 - 15.0 g/dL   HCT 39.9 36.0 - 46.0 %   MCV 90.7 80.0 - 100.0 fL   MCH 29.5 26.0 - 34.0 pg   MCHC 32.6 30.0 - 36.0 g/dL   RDW 14.0 11.5 - 15.5 %   Platelets 329 150 - 400 K/uL   nRBC 0.0 0.0 - 0.2 %    Comment: Performed at [facility], [place] [address], [place], [place] [address]  Basic metabolic panel per protocol     Status: None   Collection Time: 09/19/21 10:46 AM  Result Value Ref Range   Sodium 141 135 - 145 mmol/L    Potassium 3.9 3.5 - 5.1 mmol/L   Chloride 106 98 - 111 mmol/L   CO2 26 22 - 32 mmol/L   Glucose, Bld 93 70 - 99 mg/dL    Comment: Glucose reference range applies only to samples taken after fasting for at least 8 hours.   BUN 19 8 - 23 mg/dL   Creatinine, Ser 0.97 0.44 - 1.00 mg/dL   Calcium 9.3 8.9 - 10.3 mg/dL   GFR, Estimated >60 >60 mL/min    Comment: (NOTE) Calculated using the CKD-EPI Creatinine Equation (2021)    Anion gap 9 5 - 15    Comment: Performed at [facility], [place] [address] [place],  [address]     Estimated body mass index is 34.72 kg/m as calculated from the following:   Height as of 09/19/21: '5\' 3"'$  (1.6 m).   Weight as of 09/19/21: 88.9 kg.   Imaging Review Plain radiographs demonstrate severe degenerative joint disease of the right knee(s). The overall alignment ismild varus. The bone quality appears to be fair for age and reported activity level.      Assessment/Plan:  End stage arthritis, right knee   The patient history, physical examination, clinical judgment of the provider and imaging studies are consistent with end stage degenerative joint disease of the right knee(s) and total knee arthroplasty is deemed medically necessary. The treatment options including medical management, injection therapy arthroscopy and arthroplasty were discussed at length. The risks and benefits of total knee arthroplasty were presented and reviewed. The risks due to aseptic loosening, infection, stiffness, patella tracking problems, thromboembolic complications and other imponderables were discussed. The patient acknowledged the explanation, agreed to proceed with the plan and consent was signed. Patient is being admitted for inpatient treatment for surgery, pain control, PT, OT, prophylactic antibiotics, VTE prophylaxis,  progressive ambulation and ADL's and discharge planning. The patient is planning to be discharged home with home health  services     Patient's anticipated LOS is less than 2 midnights, meeting these requirements: - Younger than 21 - Lives within 1 hour of care - Has a competent adult at home to recover with post-op recover - NO history of  - Chronic pain requiring opiods  - Diabetes  - Coronary Artery Disease  - Heart failure  - Heart attack  - Stroke  - DVT/VTE  - Cardiac arrhythmia  - Respiratory Failure/COPD  - Renal failure  - Anemia  - Advanced Liver disease

## 2021-09-20 NOTE — Anesthesia Postprocedure Evaluation (Signed)
Anesthesia Post Note  Patient: Wendy Leblanc  Procedure(s) Performed: TOTAL KNEE ARTHROPLASTY (Right: Knee)     Patient location during evaluation: PACU Anesthesia Type: Regional, Spinal and MAC Level of consciousness: awake and alert and oriented Pain management: pain level controlled Vital Signs Assessment: post-procedure vital signs reviewed and stable Respiratory status: spontaneous breathing, nonlabored ventilation and respiratory function stable Cardiovascular status: blood pressure returned to baseline and stable Postop Assessment: no headache, no backache, spinal receding and no apparent nausea or vomiting Anesthetic complications: no   No notable events documented.  Last Vitals:  Vitals:   09/20/21 1315 09/20/21 1331  BP: (!) 132/93 (!) 136/92  Pulse: 75 75  Resp: 15   Temp:  36.5 C  SpO2: 97% 98%    Last Pain:  Vitals:   09/20/21 1337  TempSrc:   PainSc: 0-No pain                 Pervis Hocking

## 2021-09-20 NOTE — Plan of Care (Signed)

## 2021-09-20 NOTE — Anesthesia Procedure Notes (Signed)
Anesthesia Regional Block: Adductor canal block   Pre-Anesthetic Checklist: , timeout performed,  Correct Patient, Correct Site, Correct Laterality,  Correct Procedure, Correct Position, site marked,  Risks and benefits discussed,  Surgical consent,  Pre-op evaluation,  At surgeon's request and post-op pain management  Laterality: Right  Prep: Maximum Sterile Barrier Precautions used, chloraprep       Needles:  Injection technique: Single-shot  Needle Type: Echogenic Stimulator Needle     Needle Length: 9cm  Needle Gauge: 22     Additional Needles:   Procedures:,,,, ultrasound used (permanent image in chart),,    Narrative:  Start time: 09/20/2021 9:20 AM End time: 09/20/2021 9:25 AM Injection made incrementally with aspirations every 5 mL.  Performed by: Personally  Anesthesiologist: Pervis Hocking, DO  Additional Notes: Monitors applied. No increased pain on injection. No increased resistance to injection. Injection made in 5cc increments. Good needle visualization. Patient tolerated procedure well.

## 2021-09-20 NOTE — Op Note (Signed)
PATIENT ID:      Wendy Leblanc  MRN:     [id number] DOB/AGE:    [birth year] / 61 y.o.       OPERATIVE REPORT   DATE OF PROCEDURE:  09/20/2021      PREOPERATIVE DIAGNOSIS:   RIGHT KNEE END STAGE OSTEOARTHRITIS      Estimated body mass index is 34.72 kg/m as calculated from the following:   Height as of this encounter: '5\' 3"'$  (1.6 m).   Weight as of this encounter: 88.9 kg.                                                       POSTOPERATIVE DIAGNOSIS:   Same                                                                  PROCEDURE:  Procedure(s): TOTAL KNEE ARTHROPLASTY Using DepuyAttune RP implants #3N Femur, #3Tibia, 5 mm Attune RP bearing, 32 Patella    SURGEON: Alta Corning  ASSISTANT:   Nehemiah Massed PA-C   (Present and scrubbed throughout the case, critical for assistance with exposure, retraction, instrumentation, and closure.)        ANESTHESIA: spinal, 20cc Exparel, 50cc 0.25% Marcaine EBL: min cc FLUID REPLACEMENT: unk cc crystaloid TOURNIQUET: DRAINS: None TRANEXAMIC ACID: 1gm IV, 2gm topical COMPLICATIONS:  None         INDICATIONS FOR PROCEDURE: The patient has  RIGHT KNEE END STAGE OSTEOARTHRITIS, mild varus deformities, XR shows bone on bone arthritis, lateral subluxation of tibia. Patient has failed all conservative measures including anti-inflammatory medicines, narcotics, attempts at exercise and weight loss, cortisone injections and viscosupplementation.  Risks and benefits of surgery have been discussed, questions answered.   DESCRIPTION OF PROCEDURE: The patient identified by armband, received  IV antibiotics, in the holding area at [facility]. Patient taken to the operating room, appropriate anesthetic monitors were attached, and spinal anesthesia was  induced. IV Tranexamic acid was given.Tourniquet applied high to the operative thigh. Lateral post and foot positioner applied to the table, the lower extremity was then prepped and draped in usual  sterile fashion from the toes to the tourniquet. Time-out procedure was performed. Nehemiah Massed [facility], was present and scrubbed throughout the case, critical for assistance with, positioning, exposure, retraction, instrumentation, and closure.The skin and subcutaneous tissue along the incision was injected with 20 cc of a mixture of Exparel and Marcaine solution, using a 20-gauge by 1-1/2 inch needle. We began the operation, with the knee flexed 130 degrees, by making the anterior midline incision starting at handbreadth above the patella going over the patella 1 cm medial to and 4 cm distal to the tibial tubercle. Small bleeders in the skin and the subcutaneous tissue identified and cauterized. Transverse retinaculum was incised and reflected medially and a medial parapatellar arthrotomy was accomplished. the patella was everted and theprepatellar fat pad resected. The superficial medial collateral ligament was then elevated from anterior to posterior along the proximal flare of the tibia and anterior half of the menisci resected. The knee was hyperflexed exposing bone on bone  arthritis. Peripheral and notch osteophytes as well as the cruciate ligaments were then resected. We continued to work our way around posteriorly along the proximal tibia, and externally rotated the tibia subluxing it out from underneath the femur. A McHale PCL retractor was placed through the notch and a lateral Hohmann retractor placed, and we then entered the proximal tibia in line with the Depuy starter drill in line with the axis of the tibia followed by an intramedullary guide rod and 0-degree posterior slope cutting guide. The tibial cutting guide, 4 degree posterior sloped, was pinned into place allowing resection of 2 mm of bone medially and 10 mm of bone laterally. Satisfied with the tibial resection, we then entered the distal femur 2 mm anterior to the PCL origin with the intramedullary guide rod and applied the distal femoral  cutting guide set at 9 mm, with 5 degrees of valgus. This was pinned along the epicondylar axis. At this point, the distal femoral cut was accomplished without difficulty. We then sized for a #3N femoral component and pinned the guide in 3 degrees of external rotation. The chamfer cutting guide was pinned into place. The anterior, posterior, and chamfer cuts were accomplished without difficulty followed by the Attune RP box cutting guide and the box cut. We also removed posterior osteophytes from the posterior femoral condyles. The posterior capsule was injected with Exparel solution. The knee was brought into full extension. We checked our extension gap and fit a 5 mm bearing. Distracting in extension with a lamina spreader,  bleeders in the posterior capsule, Posterior medial and posterior lateral gutter were cauterized.  The transexamic acid-soaked sponge was then placed in the gap of the knee in extension. The knee was flexed 30. The posterior patella cut was accomplished with the 9.5 mm Attune cutting guide, sized for a 32 mm dome, and the fixation pegs drilled.The knee was then once again hyperflexed exposing the proximal tibia. We sized for a # 3 tibial base plate, applied the smokestack and the conical reamer followed by the the Delta fin keel punch. We then hammered into place the Attune RP trial femoral component, drilled the lugs, inserted a  5 mm trial bearing, trial patellar button, and took the knee through range of motion from 0-130 degrees. Medial and lateral ligamentous stability was checked. No thumb pressure was required for patellar Tracking. The tourniquet was released. All trial components were removed, mating surfaces irrigated with pulse lavage, and dried with suction and sponges. 10 cc of the Exparel solution was applied to the cancellus bone of the patella distal femur and proximal tibia.  After waiting 30 seconds, the bony surfaces were again, dried with sponges. A double batch of DePuy  HV cement was mixed and applied to all bony metallic mating surfaces except for the posterior condyles of the femur itself. In order, we hammered into place the tibial tray and removed excess cement, the femoral component and removed excess cement. The final Attune RP bearing was inserted, and the knee brought to full extension with compression. The patellar button was clamped into place, and excess cement removed. The knee was held at 30 flexion with compression, while the cement cured. The wound was irrigated out with normal saline solution pulse lavage. The rest of the Exparel was injected into the parapatellar arthrotomy, subcutaneous tissues, and periosteal tissues. The parapatellar arthrotomy was closed with running #1 Vicryl suture. The subcutaneous tissue with 3-0 undyed Vicryl suture, and the skin with running 3-0 SQ vicryl. An  Aquacil and Ace wrap were applied. The patient was taken to recovery room without difficulty.   Alta Corning 09/20/2021, 11:45 AM

## 2021-09-20 NOTE — Evaluation (Signed)
Physical Therapy Evaluation Patient Details Name: Wendy Leblanc MRN: [id number] DOB: 01-09-1961 Today's Date: 09/20/2021  History of Present Illness  Pt is a 61yo female presenting s/p R-TKA on 09/20/21. PMH: OA, anxiety & depression, hx of CVA, GERD, HLD, HTN,  Clinical Impression  Wendy Leblanc is a 61 y.o. female POD 0 s/p R-TKA. Patient reports modified independence using SPC for mobility at baseline. Patient is now limited by functional impairments (see PT problem list below) and requires min guard for transfers and gait with RW. Patient was able to ambulate 20 feet with RW and min gaurd assist. Patient instructed in exercise to facilitate ROM and circulation to manage edema. Patient will benefit from continued skilled PT interventions to address impairments and progress towards PLOF. Acute PT will follow to progress mobility  in preparation for safe discharge.      Recommendations for follow up therapy are one component of a multi-disciplinary discharge planning process, led by the attending physician.  Recommendations may be updated based on patient status, additional functional criteria and insurance authorization.  Follow Up Recommendations Follow physician's recommendations for discharge plan and follow up therapies (Pt is reporting she is going to SNF at [facility] vs. Dr. Berenice Primas' note saying home with HHPT)    Assistance Recommended at Discharge Frequent or constant Supervision/Assistance  Patient can return home with the following  A little help with walking and/or transfers;A little help with bathing/dressing/bathroom;Assistance with cooking/housework;Assist for transportation;Help with stairs or ramp for entrance    Equipment Recommendations None recommended by PT (Pt has recommended DME)  Recommendations for Other Services       Functional Status Assessment Patient has had a recent decline in their functional status and demonstrates the ability to make significant  improvements in function in a reasonable and predictable amount of time.     Precautions / Restrictions Precautions Precautions: None Restrictions Weight Bearing Restrictions: Yes RLE Weight Bearing: Weight bearing as tolerated      Mobility  Bed Mobility Overal bed mobility: Needs Assistance Bed Mobility: Supine to Sit     Supine to sit: Min guard     General bed mobility comments: for safety only    Transfers Overall transfer level: Needs assistance Equipment used: Rolling walker (2 wheels) Transfers: Sit to/from Stand Sit to Stand: Min guard           General transfer comment: for safety only, no physical assist rqeuired.    Ambulation/Gait Ambulation/Gait assistance: Min guard, +2 safety/equipment Gait Distance (Feet): 20 Feet Assistive device: Rolling walker (2 wheels) Gait Pattern/deviations: Step-to pattern Gait velocity: decreased     General Gait Details: Pt ambulated with RW and min guard assist, no physical assist rqeuired or overt LOB noted, +2 for recliner follow only.  Stairs            Wheelchair Mobility    Modified Rankin (Stroke Patients Only)       Balance Overall balance assessment: Needs assistance Sitting-balance support: Feet supported, No upper extremity supported Sitting balance-Leahy Scale: Fair     Standing balance support: During functional activity, Reliant on assistive device for balance, Bilateral upper extremity supported Standing balance-Leahy Scale: Poor                               Pertinent Vitals/Pain Pain Assessment Pain Assessment: No/denies pain    Home Living Family/patient expects to be discharged to:: Private residence Living Arrangements: Children Available  Help at Discharge: Family;Available 24 hours/day Type of Home: House Home Access: Level entry       Home Layout: One level Home Equipment: Grab bars - toilet;Cane - single point;Hand held shower head;Shower seat Additional  Comments: Pt is going to SNF at [facility], but home environment pertains to her living situation.    Prior Function Prior Level of Function : Independent/Modified Independent             Mobility Comments: SPC at all times ADLs Comments: IND     Hand Dominance        Extremity/Trunk Assessment   Upper Extremity Assessment Upper Extremity Assessment: Overall WFL for tasks assessed    Lower Extremity Assessment Lower Extremity Assessment: RLE deficits/detail;LLE deficits/detail RLE Deficits / Details: MMT ank DF/PF 4/5, no extensor lag noted RLE Sensation: decreased light touch (still somewhat numb) LLE Deficits / Details: MMT ank DF/PF 4/5 LLE Sensation: WNL    Cervical / Trunk Assessment Cervical / Trunk Assessment: Kyphotic  Communication   Communication: Expressive difficulties  Cognition Arousal/Alertness: Awake/alert Behavior During Therapy: WFL for tasks assessed/performed Overall Cognitive Status: Within Functional Limits for tasks assessed                                          General Comments      Exercises Total Joint Exercises Ankle Circles/Pumps: AROM, Both, 20 reps   Assessment/Plan    PT Assessment Patient needs continued PT services  PT Problem List Decreased strength;Decreased range of motion;Decreased activity tolerance;Decreased balance;Decreased coordination;Decreased mobility;Pain       PT Treatment Interventions Patient/family education;Neuromuscular re-education;Balance training;Therapeutic exercise;Therapeutic activities;Functional mobility training;Stair training;Gait training;DME instruction    PT Goals (Current goals can be found in the Care Plan section)  Acute Rehab PT Goals Patient Stated Goal: walking without cane PT Goal Formulation: With patient Time For Goal Achievement: 09/27/21 Potential to Achieve Goals: Good    Frequency 7X/week     Co-evaluation               AM-PAC PT "6 Clicks"  Mobility  Outcome Measure Help needed turning from your back to your side while in a flat bed without using bedrails?: A Little Help needed moving from lying on your back to sitting on the side of a flat bed without using bedrails?: A Little Help needed moving to and from a bed to a chair (including a wheelchair)?: A Little Help needed standing up from a chair using your arms (e.g., wheelchair or bedside chair)?: A Little Help needed to walk in hospital room?: A Little Help needed climbing 3-5 steps with a railing? : A Lot 6 Click Score: 17    End of Session Equipment Utilized During Treatment: Gait belt Activity Tolerance: Patient tolerated treatment well;No increased pain Patient left: in chair;with call bell/phone within reach;with chair alarm set Nurse Communication: Mobility status PT Visit Diagnosis: Difficulty in walking, not elsewhere classified (R26.2)    Time: 0938-1829 PT Time Calculation (min) (ACUTE ONLY): 19 min   Charges:   PT Evaluation $PT Eval Low Complexity: [place], PT, DPT [facility] Office: [phone] Pager: [phone]  Coolidge Breeze 09/20/2021, 7:13 PM

## 2021-09-20 NOTE — Telephone Encounter (Signed)
OV notes and clearance form have been faxed back to [facility]. Nothing further needed at this time.  ?

## 2021-09-20 NOTE — Progress Notes (Signed)
Pt has arrived to 1338 from PACU s/p Right TKR.  Report accepted from [facility].  Pt is alert and oriented.  CPM on.  Room orientation completed with call bell placed at bedside.  Will continue to monitor pt.

## 2021-09-20 NOTE — Anesthesia Procedure Notes (Signed)
Spinal  Patient location during procedure: OR Start time: 09/20/2021 10:10 AM End time: 09/20/2021 12:24 AM Reason for block: surgical anesthesia Staffing Performed: resident/CRNA  Resident/CRNA: Garrel Ridgel, CRNA Preanesthetic Checklist Completed: patient identified, IV checked, site marked, risks and benefits discussed, surgical consent, monitors and equipment checked, pre-op evaluation and timeout performed Spinal Block Patient position: sitting Prep: DuraPrep Patient monitoring: heart rate, continuous pulse ox and blood pressure Approach: midline Injection technique: single-shot Needle Needle type: Pencan  Needle gauge: 24 G Needle length: 9 cm Needle insertion depth: 6 cm Assessment Sensory level: T10 Events: CSF return Additional Notes

## 2021-09-20 NOTE — Progress Notes (Signed)
Orthopedic Tech Progress Note Patient Details:  Wendy Leblanc 04/03/1961 [id number]  CPM Right Knee CPM Right Knee: On Right Knee Flexion (Degrees): 60 Right Knee Extension (Degrees): 0  Post Interventions Patient Tolerated: Well  Linus Salmons Maurita Havener 09/20/2021, 1:49 PM

## 2021-09-21 LAB — CBC
HCT: 37.7 % (ref 36.0–46.0)
Hemoglobin: 12.3 g/dL (ref 12.0–15.0)
MCH: 30 pg (ref 26.0–34.0)
MCHC: 32.6 g/dL (ref 30.0–36.0)
MCV: 92 fL (ref 80.0–100.0)
Platelets: 306 10*3/uL (ref 150–400)
RBC: 4.1 MIL/uL (ref 3.87–5.11)
RDW: 14.3 % (ref 11.5–15.5)
WBC: 11.4 10*3/uL — ABNORMAL HIGH (ref 4.0–10.5)
nRBC: 0 % (ref 0.0–0.2)

## 2021-09-21 LAB — BASIC METABOLIC PANEL
Anion gap: 8 (ref 5–15)
BUN: 25 mg/dL — ABNORMAL HIGH (ref 8–23)
CO2: 23 mmol/L (ref 22–32)
Calcium: 9.1 mg/dL (ref 8.9–10.3)
Chloride: 105 mmol/L (ref 98–111)
Creatinine, Ser: 1.38 mg/dL — ABNORMAL HIGH (ref 0.44–1.00)
GFR, Estimated: 44 mL/min — ABNORMAL LOW (ref >60)
Glucose, Bld: 155 mg/dL — ABNORMAL HIGH (ref 70–99)
Potassium: 4.8 mmol/L (ref 3.5–5.1)
Sodium: 136 mmol/L (ref 135–145)

## 2021-09-21 NOTE — Progress Notes (Signed)
Physical Therapy Treatment Patient Details Name: Wendy Leblanc MRN: [id number] DOB: 07-10-1960 Today's Date: 09/21/2021   History of Present Illness Pt is a 61yo female presenting s/p R-TKA on 09/20/21. PMH: OA, anxiety & depression, hx of CVA, GERD, HLD, HTN,    PT Comments    Progressing slowly with mobility. Moderate pain during session. Pt exhibiting drowsiness, intermittent "jerky" movements of UEs, increased unsteadiness on today. Fall risk when mobilizing. Will continue to follow and progress activity as safely able. Plan is for ST SNF rehab.     Recommendations for follow up therapy are one component of a multi-disciplinary discharge planning process, led by the attending physician.  Recommendations may be updated based on patient status, additional functional criteria and insurance authorization.  Follow Up Recommendations  Follow physician's recommendations for discharge plan and follow up therapies (plan is for ST SNF)     Assistance Recommended at Discharge Frequent or constant Supervision/Assistance  Patient can return home with the following A little help with walking and/or transfers;A little help with bathing/dressing/bathroom;Assistance with cooking/housework;Assist for transportation;Help with stairs or ramp for entrance   Equipment Recommendations  None recommended by PT    Recommendations for Other Services       Precautions / Restrictions Precautions Precautions: Fall;Knee Restrictions Weight Bearing Restrictions: No RLE Weight Bearing: Weight bearing as tolerated     Mobility  Bed Mobility               General bed mobility comments: oob in recliner    Transfers Overall transfer level: Needs assistance Equipment used: Rolling walker (2 wheels) Transfers: Sit to/from Stand Sit to Stand: Min assist           General transfer comment: Assist to rise, steady, control descent. Cues for safety, technique, hand placement.     Ambulation/Gait Ambulation/Gait assistance: Min assist Gait Distance (Feet): 30 Feet Assistive device: Rolling walker (2 wheels) Gait Pattern/deviations: Step-to pattern       General Gait Details: Unsteady requiring assistance from therapist to stabilize throughout distance. Intermittent "jerky" movement observed. Cues for safety, sequence. Pt also reported drowsiness.   Stairs             Wheelchair Mobility    Modified Rankin (Stroke Patients Only)       Balance Overall balance assessment: Needs assistance         Standing balance support: During functional activity, Reliant on assistive device for balance, Bilateral upper extremity supported Standing balance-Leahy Scale: Poor                              Cognition Arousal/Alertness: Awake/alert Behavior During Therapy: WFL for tasks assessed/performed Overall Cognitive Status: Within Functional Limits for tasks assessed                                          Exercises Total Joint Exercises Ankle Circles/Pumps: AROM, Both, 10 reps Quad Sets: AROM, Both Heel Slides: AAROM, Right, 10 reps Hip ABduction/ADduction: AAROM, Right, 10 reps, AROM Straight Leg Raises: AROM, Right, 10 reps Goniometric ROM: ~10-65 degrees    General Comments        Pertinent Vitals/Pain Pain Assessment Pain Assessment: 0-10 Pain Score: 5  Pain Location: L knee Pain Descriptors / Indicators: Discomfort, Sore Pain Intervention(s): Limited activity within patient's tolerance, Monitored during session, Repositioned, Ice applied  Home Living                          Prior Function            PT Goals (current goals can now be found in the care plan section) Progress towards PT goals: Progressing toward goals    Frequency    7X/week      PT Plan Current plan remains appropriate    Co-evaluation              AM-PAC PT "6 Clicks" Mobility   Outcome Measure   Help needed turning from your back to your side while in a flat bed without using bedrails?: A Little Help needed moving from lying on your back to sitting on the side of a flat bed without using bedrails?: A Little Help needed moving to and from a bed to a chair (including a wheelchair)?: A Little Help needed standing up from a chair using your arms (e.g., wheelchair or bedside chair)?: A Little Help needed to walk in hospital room?: A Little Help needed climbing 3-5 steps with a railing? : A Lot 6 Click Score: 17    End of Session Equipment Utilized During Treatment: Gait belt Activity Tolerance: Patient tolerated treatment well Patient left: in chair;with call bell/phone within reach;with chair alarm set   PT Visit Diagnosis: Difficulty in walking, not elsewhere classified (R26.2)     Time: 1001-1016 PT Time Calculation (min) (ACUTE ONLY): 15 min  Charges:  $Gait Training: 8-22 mins              Doreatha Massed, PT Acute Rehabilitation  Office: [phone] Pager: [phone]

## 2021-09-21 NOTE — Progress Notes (Signed)
Orthopaedics Daily Progress Note   09/21/2021   8:28 AM  Wendy Leblanc is a 61 y.o. female 1 Day Post-Op s/p TOTAL KNEE ARTHROPLASTY  Subjective Pain controlled.  Denies nausea, vomiting, or fevers.  Awaiting dispo to SNF.  Objective Vitals:   09/21/21 0115 09/21/21 0506  BP: (!) 121/92 126/87  Pulse: 70 80  Resp: 18 17  Temp: 98 F (36.7 C) (!) 97.4 F (36.3 C)  SpO2: 95% 96%    Intake/Output Summary (Last 24 hours) at 09/21/2021 0569 Last data filed at 09/21/2021 0507 Gross per 24 hour  Intake 3516.76 ml  Output 1190 ml  Net 2326.76 ml    Physical Exam RLE: Dressing clean, dry, and intact +DF/PF/EHL SILT SP/DP/T +DP/PT and WWP distally  Assessment 61 y.o. female s/p Procedure(s) (LRB): TOTAL KNEE ARTHROPLASTY (Right)  Plan Mobility: OOB, PT/OT Pain control: Continue to wean/titrate to appropriate oral regimen DVT Prophylaxis: ASA 325 mg BID RLE: WBAT Disposition: SNF  Georgeanna Harrison M.D. Orthopaedic Surgery [facility]

## 2021-09-21 NOTE — TOC Initial Note (Signed)
Transition of Care [facility]) - Initial/Assessment Note   Patient Details  Name: Wendy Leblanc MRN: [id number] Date of Birth: 10-Mar-1961  [facility] of [facility]) CM/SW Contact:    Sherie Don, LCSW Phone Number: 09/21/2021, 2:03 PM  Clinical Narrative: CSW spoke with patient regarding discharge plan. H&P initially stated patient would discharge home with HHPT, but the plan is now for patient to go to SNF. Per patient, she was told it was already prearranged for her to go to [facility] through [facility].  FL2 done; PASRR pending as patient may be a level II. Initial referral faxed to [facility] in hub. TOC to follow.  Expected Discharge Plan: Skilled Nursing Facility Barriers to Discharge: [place] [facility]), SNF Pending bed offer  Patient Goals and CMS Choice Patient states their goals for this hospitalization and ongoing recovery are:: Go to rehab at [facility].gov Compare Post Acute Care list provided to:: Patient Choice offered to / list presented to : Patient  Expected Discharge Plan and Services Expected Discharge Plan: [place] In-house Referral: Clinical Social Work Post Acute Care Choice: [place] Living arrangements for the past 2 months: Apartment           DME Arranged: N/A DME Agency: NA  Prior Living Arrangements/Services Living arrangements for the past 2 months: Apartment Patient language and need for interpreter reviewed:: Yes Do you feel safe going back to the place where you live?: Yes      Need for Family Participation in Patient Care: No (Comment) Care giver support system in place?: Yes (comment) Criminal Activity/Legal Involvement Pertinent to Current Situation/Hospitalization: No - Comment as needed  Activities of Daily Living Home Assistive Devices/Equipment: Cane (specify quad or straight) ADL Screening (condition at time of admission) Patient's cognitive ability adequate to safely  complete daily activities?: No Is the patient deaf or have difficulty hearing?: No Does the patient have difficulty seeing, even when wearing glasses/contacts?: No Does the patient have difficulty concentrating, remembering, or making decisions?: No Patient able to express need for assistance with ADLs?: No Does the patient have difficulty dressing or bathing?: No Independently performs ADLs?: No Communication: Independent Dressing (OT): Independent Feeding: Independent Bathing: Independent Toileting: Independent In/Out Bed: Independent Walks in Home: Independent, Independent with device (comment) (ambulated with a cane) Does the patient have difficulty walking or climbing stairs?: Yes Weakness of Legs: Right Weakness of Arms/Hands: Right  Permission Sought/Granted Permission sought to share information with : Facility Art therapist granted to share information with : Yes, Verbal Permission Granted Permission granted to share info w AGENCY: [place]  Emotional Assessment Attitude/Demeanor/Rapport: Engaged Affect (typically observed): Accepting Orientation: : Oriented to Self, Oriented to Place, Oriented to  Time, Oriented to Situation Alcohol / Substance Use: Tobacco Use  Admission diagnosis:  Primary osteoarthritis of right knee [M17.11] Patient Active Problem List   Diagnosis Date Noted   Primary osteoarthritis of right knee 09/20/2021   Moderate persistent asthma 09/18/2021   Pre-operative respiratory examination 09/18/2021   Cervical stenosis of spinal canal 12/06/2019   Arm weakness 12/06/2019   Carpal tunnel syndrome, bilateral 12/06/2019   Dysesthesia 12/06/2019   Neck pain 12/06/2019   OSA (obstructive sleep apnea) 12/06/2019   Hypercholesterolemia 11/27/2019   Severe obesity (BMI 35.0-39.9) with comorbidity ([place]) 11/27/2019   Smoking 11/27/2019   Intermittent claudication ([place]) 11/27/2019   TIA (transient ischemic attack) 01/24/2018    Essential hypertension 01/24/2018   Depression 01/24/2018  PCP:  Drue Flirt, MD Pharmacy:   [facility] [place] [address] [place] [address] Phone: [phone] Fax: [phone]  Readmission Risk Interventions     View : No data to display.

## 2021-09-21 NOTE — NC FL2 (Signed)
[place] LEVEL OF CARE SCREENING TOOL     IDENTIFICATION  Patient Name: Wendy Leblanc Birthdate: 11-20-1960 Sex: female Admission Date (Current Location): 09/20/2021  [facility] and [place] Number:  Herbalist and Address:  [facility],  [place] [address], [place]      Provider Number: [id number]  Attending Physician Name and Address:  Dorna Leitz, MD  Relative Name and Phone Number:  Janee Morn (daughter) Ph: [phone]    Current Level of Care: Hospital Recommended Level of Care: [place] Prior Approval Number:    Date Approved/Denied:   PASRR Number: Pending  Discharge Plan: SNF    Current Diagnoses: Patient Active Problem List   Diagnosis Date Noted   Primary osteoarthritis of right knee 09/20/2021   Moderate persistent asthma 09/18/2021   Pre-operative respiratory examination 09/18/2021   Cervical stenosis of spinal canal 12/06/2019   Arm weakness 12/06/2019   Carpal tunnel syndrome, bilateral 12/06/2019   Dysesthesia 12/06/2019   Neck pain 12/06/2019   OSA (obstructive sleep apnea) 12/06/2019   Hypercholesterolemia 11/27/2019   Severe obesity (BMI 35.0-39.9) with comorbidity ([place]) 11/27/2019   Smoking 11/27/2019   Intermittent claudication ([place]) 11/27/2019   TIA (transient ischemic attack) 01/24/2018   Essential hypertension 01/24/2018   Depression 01/24/2018    Orientation RESPIRATION BLADDER Height & Weight     Self, Time, Situation, Place  Normal Continent Weight: 196 lb (88.9 kg) Height:  '5\' 3"'$  (160 cm)  BEHAVIORAL SYMPTOMS/MOOD NEUROLOGICAL BOWEL NUTRITION STATUS   (N/A)  (N/A) Continent Diet (Regular diet)  AMBULATORY STATUS COMMUNICATION OF NEEDS Skin   Limited Assist Verbally Surgical wounds                       Personal Care Assistance Level of Assistance  Bathing, Feeding, Dressing Bathing Assistance: Limited assistance Feeding assistance:  Independent Dressing Assistance: Limited assistance     Functional Limitations Info  Sight, Hearing, Speech Sight Info: Adequate Hearing Info: Adequate Speech Info: Adequate    SPECIAL CARE FACTORS FREQUENCY  PT (By licensed PT), OT (By licensed OT)     PT Frequency: 5x's/week OT Frequency: 5x's/week            Contractures Contractures Info: Not present    Additional Factors Info  Code Status, Allergies, Psychotropic Code Status Info: Full Allergies Info: Penicillins, Amlodipine, Contrast Media (Iodinated Contrast Media), Elemental Sulfur, Lisinopril Psychotropic Info: Elavil         Current Medications (09/21/2021):  This is the current hospital active medication list Current Facility-Administered Medications  Medication Dose Route Frequency Provider Last Rate Last Admin   0.9 %  sodium chloride infusion   Intravenous Continuous Gary Fleet, PA-C   Stopped at 09/21/21 0130   acetaminophen (TYLENOL) tablet 325-650 mg  325-650 mg Oral Q6H PRN Gary Fleet, PA-C       albuterol (PROVENTIL) (2.5 MG/3ML) 0.083% nebulizer solution 2.5 mg  2.5 mg Nebulization Q6H PRN Gary Fleet, PA-C       alum & mag hydroxide-simeth (MAALOX/MYLANTA) 200-200-20 MG/5ML suspension 30 mL  30 mL Oral Q4H PRN Gary Fleet, PA-C       amitriptyline (ELAVIL) tablet 10 mg  10 mg Oral QHS Mackensie, Pilson, PA-C   10 mg at 09/20/21 2139   aspirin EC tablet 325 mg  325 mg Oral BID PC Consuela, Widener, PA-C   325 mg at 09/21/21 0857   bisacodyl (DULCOLAX) EC tablet 5 mg  5 mg  Oral Daily PRN Gary Fleet, PA-C       celecoxib (CELEBREX) capsule 200 mg  200 mg Oral BID Jailyn, Leeson, PA-C   200 mg at 09/21/21 6629   cholecalciferol (VITAMIN D3) tablet 1,000 Units  1,000 Units Oral Daily Debbrah, Sampedro, PA-C   1,000 Units at 09/21/21 0859   dexamethasone (DECADRON) injection 10 mg  10 mg Intravenous Q12H Gary Fleet, PA-C   10 mg at 09/21/21 4765   diphenhydrAMINE (BENADRYL) 12.5 MG/5ML elixir  12.5-25 mg  12.5-25 mg Oral Q4H PRN Gary Fleet, PA-C       docusate sodium (COLACE) capsule 100 mg  100 mg Oral BID Gary Fleet, PA-C   100 mg at 09/21/21 0857   fluticasone furoate-vilanterol (BREO ELLIPTA) 200-25 MCG/ACT 1 puff  1 puff Inhalation Daily Gary Fleet, PA-C   1 puff at 09/21/21 4650   And   umeclidinium bromide (INCRUSE ELLIPTA) 62.5 MCG/ACT 1 puff  1 puff Inhalation Daily Gary Fleet, PA-C   1 puff at 09/21/21 3546   gabapentin (NEURONTIN) capsule 300 mg  300 mg Oral q morning Dimple Nanas, RPH   300 mg at 09/21/21 0857   gabapentin (NEURONTIN) capsule 600 mg  600 mg Oral QHS Dimple Nanas, RPH   600 mg at 09/20/21 2138   hydrALAZINE (APRESOLINE) tablet 25 mg  25 mg Oral BID Jetty, Berland, PA-C   25 mg at 09/21/21 0859   losartan (COZAAR) tablet 100 mg  100 mg Oral Daily Dimple Nanas, RPH   100 mg at 09/21/21 5681   And   hydrochlorothiazide (HYDRODIURIL) tablet 25 mg  25 mg Oral Daily Dimple Nanas, RPH   25 mg at 09/21/21 2751   HYDROmorphone (DILAUDID) injection 0.5-1 mg  0.5-1 mg Intravenous Q4H PRN Gary Fleet, PA-C   1 mg at 09/21/21 0024   menthol-cetylpyridinium (CEPACOL) lozenge 3 mg  1 lozenge Oral PRN Gary Fleet, PA-C       Or   phenol (CHLORASEPTIC) mouth spray 1 spray  1 spray Mouth/Throat PRN Gary Fleet, PA-C       methocarbamol (ROBAXIN) tablet 500 mg  500 mg Oral Q6H PRN Gary Fleet, PA-C   500 mg at 09/21/21 0241   Or   methocarbamol (ROBAXIN) 500 mg in dextrose 5 % 50 mL IVPB  500 mg Intravenous Q6H PRN Gary Fleet, PA-C       montelukast (SINGULAIR) tablet 10 mg  10 mg Oral QHS Gary Fleet, PA-C   10 mg at 09/20/21 2139   ondansetron (ZOFRAN) tablet 4 mg  4 mg Oral Q6H PRN Gary Fleet, PA-C       Or   ondansetron [facility]) injection 4 mg  4 mg Intravenous Q6H PRN Gary Fleet, PA-C       oxyCODONE (Oxy IR/ROXICODONE) immediate release tablet 5-10 mg  5-10 mg Oral Q4H PRN Gary Fleet, PA-C   10  mg at 09/21/21 1205   pantoprazole (PROTONIX) EC tablet 40 mg  40 mg Oral Daily Lihanna, Biever, PA-C   40 mg at 09/21/21 0858   polyethylene glycol (MIRALAX / GLYCOLAX) packet 17 g  17 g Oral Daily PRN Gary Fleet, PA-C       senna-docusate (Senokot-S) tablet 1 tablet  1 tablet Oral QHS Leina, Babe, PA-C   1 tablet at 09/20/21 2138   spironolactone (ALDACTONE) tablet 25 mg  25 mg Oral Daily Monesha, Monreal, PA-C   25 mg at 09/21/21 0857   topiramate (TOPAMAX) tablet 25 mg  25 mg Oral  Daily PRN Gary Fleet, PA-C       zolpidem (AMBIEN) tablet 5 mg  5 mg Oral QHS PRN Gary Fleet, PA-C         Discharge Medications: Please see discharge summary for a list of discharge medications.  Relevant Imaging Results:  Relevant Lab Results:   Additional Information SSN: [id number]  Sherie Don, LCSW

## 2021-09-21 NOTE — Progress Notes (Signed)
Physical Therapy Treatment Patient Details Name: Wendy Leblanc MRN: [id number] DOB: 29-Feb-1961 Today's Date: 09/21/2021   History of Present Illness Pt is a 61yo female presenting s/p R-TKA on 09/20/21. PMH: OA, anxiety & depression, hx of CVA, GERD, HLD, HTN,    PT Comments    Progressing with mobility. Pain controlled. Pt continues to exhibit "jerky" movements in UEs and LEs-encouraged pt to continue to keep RN and MD aware of this. Plan is for ST SNF.    Recommendations for follow up therapy are one component of a multi-disciplinary discharge planning process, led by the attending physician.  Recommendations may be updated based on patient status, additional functional criteria and insurance authorization.  Follow Up Recommendations  Follow physician's recommendations for discharge plan and follow up therapies (plan is for ST SNF)     Assistance Recommended at Discharge Frequent or constant Supervision/Assistance  Patient can return home with the following A little help with walking and/or transfers;A little help with bathing/dressing/bathroom;Assistance with cooking/housework;Assist for transportation;Help with stairs or ramp for entrance   Equipment Recommendations  None recommended by PT    Recommendations for Other Services       Precautions / Restrictions Precautions Precautions: Fall;Knee Restrictions Weight Bearing Restrictions: No RLE Weight Bearing: Weight bearing as tolerated     Mobility  Bed Mobility Overal bed mobility: Needs Assistance             General bed mobility comments: oob in recliner    Transfers Overall transfer level: Needs assistance Equipment used: Rolling walker (2 wheels) Transfers: Sit to/from Stand Sit to Stand: Min guard           General transfer comment: Min guard for safety. Cues for safety, technique, hand placement. Increased time.    Ambulation/Gait Ambulation/Gait assistance: Min guard Gait Distance (Feet):  75 Feet Assistive device: Rolling walker (2 wheels) Gait Pattern/deviations: Step-to pattern       General Gait Details: Min guard for safety. Slow gait speed. Cues for safety. Pt stil lhaving intermittent "jerky" movements in UEs.   Stairs             Wheelchair Mobility    Modified Rankin (Stroke Patients Only)       Balance Overall balance assessment: Needs assistance         Standing balance support: During functional activity, Reliant on assistive device for balance, Bilateral upper extremity supported Standing balance-Leahy Scale: Poor                              Cognition Arousal/Alertness: Awake/alert Behavior During Therapy: WFL for tasks assessed/performed Overall Cognitive Status: Within Functional Limits for tasks assessed                                          Exercises     General Comments        Pertinent Vitals/Pain Pain Assessment Pain Assessment: 0-10 Pain Score: 5  Pain Location: L knee Pain Descriptors / Indicators: Discomfort, Sore Pain Intervention(s): Limited activity within patient's tolerance, Monitored during session, Repositioned    Home Living                          Prior Function            PT Goals (current goals can  now be found in the care plan section) Progress towards PT goals: Progressing toward goals    Frequency    7X/week      PT Plan Current plan remains appropriate    Co-evaluation              AM-PAC PT "6 Clicks" Mobility   Outcome Measure  Help needed turning from your back to your side while in a flat bed without using bedrails?: A Little Help needed moving from lying on your back to sitting on the side of a flat bed without using bedrails?: A Little Help needed moving to and from a bed to a chair (including a wheelchair)?: A Little Help needed standing up from a chair using your arms (e.g., wheelchair or bedside chair)?: A Little Help  needed to walk in hospital room?: A Little Help needed climbing 3-5 steps with a railing? : A Lot 6 Click Score: 17    End of Session Equipment Utilized During Treatment: Gait belt Activity Tolerance: Patient tolerated treatment well Patient left: in chair;with call bell/phone within reach;with family/visitor present   PT Visit Diagnosis: Difficulty in walking, not elsewhere classified (R26.2)     Time: 1355-1405 PT Time Calculation (min) (ACUTE ONLY): 10 min  Charges:  $Gait Training: 8-22 mins                         Doreatha Massed, PT Acute Rehabilitation  Office: [phone] Pager: [phone]

## 2021-09-22 LAB — CBC
HCT: 35.4 % — ABNORMAL LOW (ref 36.0–46.0)
Hemoglobin: 11.4 g/dL — ABNORMAL LOW (ref 12.0–15.0)
MCH: 29.4 pg (ref 26.0–34.0)
MCHC: 32.2 g/dL (ref 30.0–36.0)
MCV: 91.2 fL (ref 80.0–100.0)
Platelets: 281 10*3/uL (ref 150–400)
RBC: 3.88 MIL/uL (ref 3.87–5.11)
RDW: 14.4 % (ref 11.5–15.5)
WBC: 14.3 10*3/uL — ABNORMAL HIGH (ref 4.0–10.5)
nRBC: 0 % (ref 0.0–0.2)

## 2021-09-22 NOTE — Progress Notes (Signed)
Physical Therapy Treatment Patient Details Name: Wendy Leblanc MRN: [id number] DOB: 08-Apr-1961 Today's Date: 09/22/2021   History of Present Illness Pt is a 61yo female presenting s/p R-TKA on 09/20/21. PMH: OA, anxiety & depression, hx of CVA, GERD, HLD, HTN,    PT Comments    Progressing well. Pain controlled with activity. Plan is for ST rehab at SNF.    Recommendations for follow up therapy are one component of a multi-disciplinary discharge planning process, led by the attending physician.  Recommendations may be updated based on patient status, additional functional criteria and insurance authorization.  Follow Up Recommendations  Follow physician's recommendations for discharge plan and follow up therapies     Assistance Recommended at Discharge Frequent or constant Supervision/Assistance  Patient can return home with the following A little help with walking and/or transfers;A little help with bathing/dressing/bathroom;Assistance with cooking/housework;Assist for transportation;Help with stairs or ramp for entrance   Equipment Recommendations  None recommended by PT    Recommendations for Other Services       Precautions / Restrictions Precautions Precautions: Fall;Knee Restrictions Weight Bearing Restrictions: No RLE Weight Bearing: Weight bearing as tolerated     Mobility  Bed Mobility               General bed mobility comments: oob in recliner    Transfers Overall transfer level: Needs assistance Equipment used: Rolling walker (2 wheels) Transfers: Sit to/from Stand Sit to Stand: Min guard           General transfer comment: Min guard for safety. Cues for safety, technique, hand placement. Increased time.    Ambulation/Gait Ambulation/Gait assistance: Min guard Gait Distance (Feet): 85 Feet Assistive device: Rolling walker (2 wheels) Gait Pattern/deviations: Step-through pattern, Decreased stride length       General Gait Details: Min  guard for safety. Slow gait speed. Cues for safety. Pt is stil lhaving intermittent "jerky" movements in UEs/LEs-recommended she mention this to the RN/MD   Stairs             Wheelchair Mobility    Modified Rankin (Stroke Patients Only)       Balance Overall balance assessment: Needs assistance         Standing balance support: During functional activity, Reliant on assistive device for balance, Bilateral upper extremity supported Standing balance-Leahy Scale: Poor                              Cognition Arousal/Alertness: Awake/alert Behavior During Therapy: WFL for tasks assessed/performed Overall Cognitive Status: Within Functional Limits for tasks assessed                                          Exercises Total Joint Exercises Ankle Circles/Pumps: AROM, Both, 10 reps Quad Sets: AROM, Both, 10 reps Heel Slides: AAROM, Right, 10 reps Hip ABduction/ADduction: Right, 10 reps, AROM Straight Leg Raises: AROM, Right, 10 reps Goniometric ROM: ~10-70 degrees    General Comments        Pertinent Vitals/Pain Pain Assessment Pain Assessment: 0-10 Pain Score: 5  Pain Location: L knee Pain Descriptors / Indicators: Discomfort, Sore Pain Intervention(s): Limited activity within patient's tolerance, Monitored during session, Repositioned, Ice applied    Home Living  Prior Function            PT Goals (current goals can now be found in the care plan section) Progress towards PT goals: Progressing toward goals    Frequency    7X/week      PT Plan Current plan remains appropriate    Co-evaluation              AM-PAC PT "6 Clicks" Mobility   Outcome Measure  Help needed turning from your back to your side while in a flat bed without using bedrails?: A Little Help needed moving from lying on your back to sitting on the side of a flat bed without using bedrails?: A Little Help needed  moving to and from a bed to a chair (including a wheelchair)?: A Little Help needed standing up from a chair using your arms (e.g., wheelchair or bedside chair)?: A Little Help needed to walk in hospital room?: A Little Help needed climbing 3-5 steps with a railing? : A Lot 6 Click Score: 17    End of Session Equipment Utilized During Treatment: Gait belt Activity Tolerance: Patient tolerated treatment well Patient left: in chair;with call bell/phone within reach Nurse Communication: Mobility status PT Visit Diagnosis: Difficulty in walking, not elsewhere classified (R26.2)     Time: 1941-7408 PT Time Calculation (min) (ACUTE ONLY): 22 min  Charges:  $Gait Training: 8-22 mins              Doreatha Massed, PT Acute Rehabilitation  Office: [phone] Pager: [phone]

## 2021-09-22 NOTE — Plan of Care (Signed)
  Problem: Coping: Goal: Level of anxiety will decrease Outcome: Progressing   Problem: Pain Managment: Goal: General experience of comfort will improve Outcome: Progressing   

## 2021-09-22 NOTE — Plan of Care (Signed)
  Problem: Pain Management: Goal: Pain level will decrease with appropriate interventions Outcome: Progressing   Problem: Skin Integrity: Goal: Will show signs of wound healing Outcome: Progressing   Problem: Activity: Goal: Ability to avoid complications of mobility impairment will improve Outcome: Progressing   

## 2021-09-22 NOTE — Progress Notes (Signed)
Orthopaedics Daily Progress Note   09/22/2021   11:46 AM  Katy Apo Marjani Kobel is a 61 y.o. female 2 Days Post-Op s/p TOTAL KNEE ARTHROPLASTY  Subjective Pain controlled.  Denies nausea, vomiting, or fevers.  Awaiting dispo to SNF.  Objective Vitals:   09/22/21 0501 09/22/21 0844  BP: (!) 145/88   Pulse: 81   Resp: 16   Temp: 97.6 F (36.4 C)   SpO2: 99% 97%    Intake/Output Summary (Last 24 hours) at 09/22/2021 1146 Last data filed at 09/22/2021 1003 Gross per 24 hour  Intake 1740 ml  Output 350 ml  Net 1390 ml     Physical Exam RLE: Dressing clean, dry, and intact +DF/PF/EHL SILT SP/DP/T +DP/PT and WWP distally  Assessment 61 y.o. female s/p Procedure(s) (LRB): TOTAL KNEE ARTHROPLASTY (Right)  Plan Mobility: OOB, PT/OT Pain control: Continue to wean/titrate to appropriate oral regimen DVT Prophylaxis: ASA 325 mg BID RLE: WBAT Disposition: SNF  Georgeanna Harrison M.D. Orthopaedic Surgery [facility]

## 2021-09-23 NOTE — Plan of Care (Signed)
  Problem: Nutrition: Goal: Adequate nutrition will be maintained Outcome: Progressing  Pt w/ plans for extended rehab

## 2021-09-23 NOTE — TOC Progression Note (Signed)
Transition of Care [facility]) - Progression Note   Patient Details  Name: Wendy Leblanc MRN: [id number] Date of Birth: 07-Oct-1960  Transition of Care [facility]) CM/SW Brown, LCSW Phone Number: 09/23/2021, 1:33 PM  Clinical Narrative: CSW spoke with Crystal with [facility] and confirmed the discharge plan is for the patient to go to [facility] for rehab. CSW uploaded clinicals to [facility] MUST for review. Patient is a level II PASRR and it can take several business days to receive the number. CSW updated Crystal with [facility], the patient, and Lexine Baton in admissions at [facility] regarding PASRR delay. TOC awaiting PASRR.  Expected Discharge Plan: Skilled Nursing Facility Barriers to Discharge: [place] [facility]), SNF Pending bed offer  Expected Discharge Plan and Services Expected Discharge Plan: [place] In-house Referral: Clinical Social Work Post Acute Care Choice: [place] Living arrangements for the past 2 months: Apartment Expected Discharge Date: 09/23/21               DME Arranged: N/A DME Agency: NA  Readmission Risk Interventions     View : No data to display.

## 2021-09-23 NOTE — TOC PASRR Note (Signed)
[place] Note  Patient Details  Name: Wendy Leblanc Date of Birth: 01-04-1961 [place] MUST ID: [id number]  Transition of Care (TOC) CM/SW Contact:    Sherie Don, LCSW Phone Number: 09/23/2021, 9:56 AM  To Whom It May Concern:  Please be advised that this patient will require a short-term nursing home stay - anticipated 30 days or less for rehabilitation and strengthening. The plan is for return home.

## 2021-09-23 NOTE — Progress Notes (Signed)
Subjective: 3 Days Post-Op Procedure(s) (LRB): TOTAL KNEE ARTHROPLASTY (Right) Patient reports pain as moderate.    Objective: Vital signs in last 24 hours: Temp:  [97.9 F (36.6 C)-98.3 F (36.8 C)] 98.3 F (36.8 C) (05/22 0621) Pulse Rate:  [81-99] 99 (05/22 0621) Resp:  [17] 17 (05/22 0621) BP: (100-127)/(65-73) 100/73 (05/22 0621) SpO2:  [97 %-100 %] 97 % (05/22 0621)  Intake/Output from previous day: 05/21 0701 - 05/22 0700 In: 2380 [P.O.:2380] Out: -  Intake/Output this shift: Total I/O In: 240 [P.O.:240] Out: -   Recent Labs    09/21/21 0739 09/22/21 0308  HGB 12.3 11.4*   Recent Labs    09/21/21 0739 09/22/21 0308  WBC 11.4* 14.3*  RBC 4.10 3.88  HCT 37.7 35.4*  PLT 306 281   Recent Labs    09/21/21 0332  NA 136  K 4.8  CL 105  CO2 23  BUN 25*  CREATININE 1.38*  GLUCOSE 155*  CALCIUM 9.1   No results for input(s): LABPT, INR in the last 72 hours.  Neurologically intact ABD soft Neurovascular intact Sensation intact distally Intact pulses distally Dorsiflexion/Plantar flexion intact No cellulitis present Compartment soft   Assessment/Plan: 3 Days Post-Op Procedure(s) (LRB): TOTAL KNEE ARTHROPLASTY (Right) Advance diet Up with therapy Discharge to SNF or other based on pace of the triad   Anticipated LOS equal to or greater than 2 midnights due to - Age 17 and older with one or more of the following:  - Obesity  - Expected need for hospital services (PT, OT, Nursing) required for safe  discharge  - Anticipated need for postoperative skilled nursing care or inpatient rehab  - Active co-morbidities: Chronic pain requiring opiods, Anemia, and patient's current living situation does not support the ability to be returned to her living environment.  She has a managed medical plan that has requested she go to skilled nursing. OR   - Unanticipated findings during/Post Surgery: Slow post-op progression: GI, pain control, mobility  -  Patient is a high risk of re-admission due to: Barriers to post-acute care (logistical, no family support in home)   Alta Corning 09/23/2021, 8:12 AM

## 2021-09-23 NOTE — Progress Notes (Signed)
Physical Therapy Treatment Patient Details Name: Wendy Leblanc MRN: [id number] DOB: 08-02-1960 Today's Date: 09/23/2021   History of Present Illness Pt is a 61yo female presenting s/p R-TKA on 09/20/21. PMH: OA, anxiety & depression, hx of CVA, GERD, HLD, HTN,    PT Comments    Progressing with mobility. Pt is still exhibiting/experiencing involuntary jerking movements of her UEs/LEs. Pt stated she mentioned this to the RN. Advised her to discuss with MD as well to get their opinion/thoughts as to why this was occurring. Plan is for ST rehab at SNF.     Recommendations for follow up therapy are one component of a multi-disciplinary discharge planning process, led by the attending physician.  Recommendations may be updated based on patient status, additional functional criteria and insurance authorization.  Follow Up Recommendations  Follow physician's recommendations for discharge plan and follow up therapies     Assistance Recommended at Discharge Frequent or constant Supervision/Assistance  Patient can return home with the following A little help with walking and/or transfers;A little help with bathing/dressing/bathroom;Assistance with cooking/housework;Assist for transportation;Help with stairs or ramp for entrance   Equipment Recommendations  None recommended by PT    Recommendations for Other Services       Precautions / Restrictions Precautions Precautions: Fall;Knee Restrictions Weight Bearing Restrictions: No RLE Weight Bearing: Weight bearing as tolerated     Mobility  Bed Mobility               General bed mobility comments: oob in recliner    Transfers Overall transfer level: Needs assistance Equipment used: Rolling walker (2 wheels) Transfers: Sit to/from Stand Sit to Stand: Min assist           General transfer comment: A to steady initially. Pt is still exhibiting/experiencing jerking movement of UEs/LEs. Cues for safety.     Ambulation/Gait Ambulation/Gait assistance: Min assist Gait Distance (Feet): 75 Feet Assistive device: Rolling walker (2 wheels) Gait Pattern/deviations: Step-through pattern, Decreased stride length       General Gait Details: Assist to stabilize intermittently. Cues for safety.   Stairs             Wheelchair Mobility    Modified Rankin (Stroke Patients Only)       Balance Overall balance assessment: Needs assistance         Standing balance support: During functional activity, Reliant on assistive device for balance, Bilateral upper extremity supported Standing balance-Leahy Scale: Poor                              Cognition Arousal/Alertness: Awake/alert Behavior During Therapy: WFL for tasks assessed/performed Overall Cognitive Status: Within Functional Limits for tasks assessed                                          Exercises Total Joint Exercises Ankle Circles/Pumps: Both, 10 reps Quad Sets: AROM, Both, 10 reps Hip ABduction/ADduction: Right, 10 reps, AROM Straight Leg Raises: AROM, Right, 10 reps Knee Flexion: AROM, Right, 10 reps, Seated Goniometric ROM: ~10-75 degrees    General Comments        Pertinent Vitals/Pain Pain Assessment Pain Assessment: Faces Faces Pain Scale: Hurts little more Pain Location: L knee Pain Descriptors / Indicators: Discomfort, Sore Pain Intervention(s): Monitored during session, Repositioned, Ice applied    Home Living  Prior Function            PT Goals (current goals can now be found in the care plan section) Progress towards PT goals: Progressing toward goals    Frequency    7X/week      PT Plan Current plan remains appropriate    Co-evaluation              AM-PAC PT "6 Clicks" Mobility   Outcome Measure  Help needed turning from your back to your side while in a flat bed without using bedrails?: A Little Help  needed moving from lying on your back to sitting on the side of a flat bed without using bedrails?: A Little Help needed moving to and from a bed to a chair (including a wheelchair)?: A Little Help needed standing up from a chair using your arms (e.g., wheelchair or bedside chair)?: A Little Help needed to walk in hospital room?: A Little Help needed climbing 3-5 steps with a railing? : A Little 6 Click Score: 18    End of Session Equipment Utilized During Treatment: Gait belt Activity Tolerance: Patient tolerated treatment well Patient left: in chair;with call bell/phone within reach;with chair alarm set   PT Visit Diagnosis: Difficulty in walking, not elsewhere classified (R26.2)     Time: 9702-6378 PT Time Calculation (min) (ACUTE ONLY): 25 min  Charges:  $Gait Training: 8-22 mins $Therapeutic Exercise: 8-22 mins                         Doreatha Massed, PT Acute Rehabilitation  Office: [phone] Pager: [phone]

## 2021-09-24 ENCOUNTER — Encounter (HOSPITAL_COMMUNITY): Payer: Self-pay | Admitting: Orthopedic Surgery

## 2021-09-24 MED ORDER — ASPIRIN 325 MG PO TBEC: 325.0000 mg | DELAYED_RELEASE_TABLET | Freq: Two times a day (BID) | ORAL | 0 refills | Status: AC

## 2021-09-24 MED ORDER — TIZANIDINE HCL 2 MG PO TABS: 2.0000 mg | ORAL_TABLET | Freq: Three times a day (TID) | ORAL | 0 refills | Status: AC | PRN

## 2021-09-24 NOTE — Progress Notes (Signed)
Physical Therapy Treatment Patient Details Name: Wendy Leblanc MRN: [id number] DOB: 12/08/1960 Today's Date: 09/24/2021   History of Present Illness Pt is a 61yo female presenting s/p R-TKA on 09/20/21. PMH: OA, anxiety & depression, hx of CVA, GERD, HLD, HTN,    PT Comments    Per discussion at front desk during huddle, MD is planning to d/c pt home instead of SNF if they don't receive authorization today.  Overall, pt remains Min guard-Min A for mobility. She is at risk for falls when mobilizing due to the involuntary muscle jerking in UEs/LEs that pt is exhibiting. I have made mention of this in my notes since 5/20. Pt also mentioned that she had a near fall in the bathroom and RN had to assist her. If pt returns home, recommend 24/7 supervision for safety.     Recommendations for follow up therapy are one component of a multi-disciplinary discharge planning process, led by the attending physician.  Recommendations may be updated based on patient status, additional functional criteria and insurance authorization.  Follow Up Recommendations  Follow physician's recommendations for discharge plan and follow up therapies     Assistance Recommended at Discharge Frequent or constant Supervision/Assistance  Patient can return home with the following A little help with walking and/or transfers;A little help with bathing/dressing/bathroom;Assistance with cooking/housework;Assist for transportation;Help with stairs or ramp for entrance   Equipment Recommendations  Rolling Walker; 3n1    Recommendations for Other Services       Precautions / Restrictions Precautions Precautions: Fall;Knee Restrictions Weight Bearing Restrictions: No RLE Weight Bearing: Weight bearing as tolerated     Mobility  Bed Mobility               General bed mobility comments: oob in recliner    Transfers Overall transfer level: Needs assistance Equipment used: Rolling walker (2  wheels) Transfers: Sit to/from Stand Sit to Stand: Min guard           General transfer comment: Min guard for safety. Cues for safety, hand placement.    Ambulation/Gait Ambulation/Gait assistance: Min assist Gait Distance (Feet): 115 Feet Assistive device: Rolling walker (2 wheels) Gait Pattern/deviations: Step-through pattern, Decreased stride length       General Gait Details: Assist to stabilize intermittently due to involuntary muscle jerks-today hand slipped off of one side of walker and walker tipped-assist from therapist to steady. Cues for safety.   Stairs             Wheelchair Mobility    Modified Rankin (Stroke Patients Only)       Balance Overall balance assessment: Needs assistance         Standing balance support: During functional activity, Reliant on assistive device for balance, Bilateral upper extremity supported Standing balance-Leahy Scale: Poor                              Cognition Arousal/Alertness: Awake/alert Behavior During Therapy: WFL for tasks assessed/performed Overall Cognitive Status: Within Functional Limits for tasks assessed                                 General Comments: drowsy        Exercises Total Joint Exercises Ankle Circles/Pumps: Both, 10 reps Quad Sets: AROM, Both, 10 reps Heel Slides: AAROM, Right, 10 reps Hip ABduction/ADduction: Right, 10 reps, AROM Straight Leg Raises: AROM, Right, 10  reps Knee Flexion: AROM, Right, 10 reps, Seated Goniometric ROM: ~10-75 degrees    General Comments        Pertinent Vitals/Pain Pain Assessment Pain Assessment: 0-10 Pain Score: 7  Pain Location: L knee Pain Descriptors / Indicators: Discomfort, Sore Pain Intervention(s): Monitored during session, Ice applied    Home Living                          Prior Function            PT Goals (current goals can now be found in the care plan section) Progress towards PT  goals: Progressing toward goals    Frequency    7X/week      PT Plan Current plan remains appropriate    Co-evaluation              AM-PAC PT "6 Clicks" Mobility   Outcome Measure  Help needed turning from your back to your side while in a flat bed without using bedrails?: A Little Help needed moving from lying on your back to sitting on the side of a flat bed without using bedrails?: A Little Help needed moving to and from a bed to a chair (including a wheelchair)?: A Little Help needed standing up from a chair using your arms (e.g., wheelchair or bedside chair)?: A Little Help needed to walk in hospital room?: A Little Help needed climbing 3-5 steps with a railing? : A Little 6 Click Score: 18    End of Session Equipment Utilized During Treatment: Gait belt Activity Tolerance: Patient tolerated treatment well Patient left: in chair;with call bell/phone within reach;with chair alarm set   PT Visit Diagnosis: Difficulty in walking, not elsewhere classified (R26.2)     Time: 2800-3491 PT Time Calculation (min) (ACUTE ONLY): 12 min  Charges:  $Gait Training: 8-22 mins              Doreatha Massed, PT Acute Rehabilitation  Office: [phone] Pager: [phone]

## 2021-09-24 NOTE — Progress Notes (Signed)
Subjective: 4 Days Post-Op Procedure(s) (LRB): TOTAL KNEE ARTHROPLASTY (Right) Patient reports pain as mild.  The patient is progressing with physical therapy.  She is taking by mouth and voiding okay.  She reports that she lives at home with her daughter who works during the day from 6 AM to 3 PM.  She ambulated 75 feet with PT yesterday.  Objective: Vital signs in last 24 hours: Temp:  [97.9 F (36.6 C)-98.5 F (36.9 C)] 97.9 F (36.6 C) (05/23 0606) Pulse Rate:  [79-92] 79 (05/23 0606) Resp:  [17-20] 17 (05/23 0606) BP: (100-104)/(48-72) 100/48 (05/23 0606) SpO2:  [56 %-100 %] 100 % (05/23 0606)  Intake/Output from previous day: 05/22 0701 - 05/23 0700 In: 930 [P.O.:930] Out: -  Intake/Output this shift: Total I/O In: 240 [P.O.:240] Out: -   Recent Labs    09/22/21 0308  HGB 11.4*   Recent Labs    09/22/21 0308  WBC 14.3*  RBC 3.88  HCT 35.4*  PLT 281   No results for input(s): NA, K, CL, CO2, BUN, CREATININE, GLUCOSE, CALCIUM in the last 72 hours. No results for input(s): LABPT, INR in the last 72 hours. Right knee exam: Neurovascular intact Sensation intact distally Intact pulses distally Dorsiflexion/Plantar flexion intact Incision: dressing C/D/I Compartment soft Patient is able to easily do a straight leg raise on the right.  Assessment/Plan: 4 Days Post-Op Procedure(s) (LRB): TOTAL KNEE ARTHROPLASTY (Right) Plan: Okay to discharge either home today with home health PT or to skilled nursing facility if bed can be arranged. Aspirin 325 mg twice daily x1 month postop for DVT prophylaxis. Up with therapy weightbearing as tolerated on the right. If the patient is discharged home she will need home devices such as an elevated commode seat and walker.  Anticipated LOS equal to or greater than 2 midnights due to - Age 62 and older with one or more of the following:  - Obesity  - Expected need for hospital services (PT, OT, Nursing) required for safe   discharge  - Anticipated need for postoperative skilled nursing care or inpatient rehab  - Active co-morbidities:   History of CVA OR   - Unanticipated findings during/Post Surgery: Slow post-op progression: GI, pain control, mobility  - Patient is a high risk of re-admission due to: Barriers to post-acute care (logistical, no family support in home) Follow-up with Dr. Berenice Primas in 2 weeks.  Wendy Leblanc 09/24/2021, 8:21 AM

## 2021-09-24 NOTE — Discharge Summary (Signed)
Patient ID: Wendy Leblanc MRN: [id number] DOB/AGE: 05-12-1960 61 y.o.  Admit date: 09/20/2021 Discharge date: 09/24/2021  Admission Diagnoses:  Principal Problem:   Primary osteoarthritis of right knee   Discharge Diagnoses:  Same  Past Medical History:  Diagnosis Date   Anemia    Anxiety    Arthritis    Carpal tunnel syndrome    Cataract    CVA (cerebral vascular accident) ([place])    Depression    Dyspnea    Gait abnormality    GERD (gastroesophageal reflux disease)    Hyperlipidemia    Hypertension    Migraine    Osteoarthritis    Pneumonia    Smoker    Stroke ([place]) 2019    Surgeries: Procedure(s): Right TOTAL KNEE ARTHROPLASTY on 09/20/2021   Consultants:   Discharged Condition: Improved  Hospital Course: Kathyleen Radice is an 61 y.o. female who was admitted 09/20/2021 for operative treatment ofPrimary osteoarthritis of right knee. Patient has severe unremitting pain that affects sleep, daily activities, and work/hobbies. After pre-op clearance the patient was taken to the operating room on 09/20/2021 and underwent  Procedure(s): TOTAL KNEE ARTHROPLASTY.  Right  Patient was given perioperative antibiotics:  Anti-infectives (From admission, onward)    Start     Dose/Rate Route Frequency Ordered Stop   09/20/21 2100  vancomycin (VANCOCIN) IVPB 1000 mg/200 mL premix        1,000 mg 200 mL/hr over 60 Minutes Intravenous Every 12 hours 09/20/21 1335 09/20/21 2242   09/20/21 0800  vancomycin (VANCOCIN) IVPB 1000 mg/200 mL premix        1,000 mg 200 mL/hr over 60 Minutes Intravenous On call to O.R. 09/20/21 0756 09/20/21 1558        Patient was given sequential compression devices, early ambulation, and chemoprophylaxis to prevent DVT.  Patient benefited maximally from hospital stay and there were no complications.    Recent vital signs: Patient Vitals for the past 24 hrs:  BP Temp Temp src Pulse Resp SpO2  09/24/21 0606 (!) 100/48 97.9 F (36.6 C) --  79 17 100 %  09/23/21 2110 104/66 98.5 F (36.9 C) Oral 92 17 99 %  09/23/21 1301 104/72 -- -- 86 20 (!) 56 %     Recent laboratory studies:  Recent Labs    09/22/21 0308  WBC 14.3*  HGB 11.4*  HCT 35.4*  PLT 281     Discharge Medications:   Allergies as of 09/24/2021       Reactions   Penicillins Anaphylaxis   Amlodipine Hives, Swelling   Lip swelling   Contrast Media [iodinated Contrast Media] Hives   Elemental Sulfur Hives   Lisinopril Swelling        Medication List     STOP taking these medications    lidocaine 4 %       TAKE these medications    albuterol 108 (90 Base) MCG/ACT inhaler Commonly known as: VENTOLIN HFA Inhale 1-2 puffs into the lungs every 4 (four) hours as needed for wheezing or shortness of breath.   albuterol (2.5 MG/3ML) 0.083% nebulizer solution Commonly known as: PROVENTIL Take 3 mLs (2.5 mg total) by nebulization every 6 (six) hours as needed for wheezing or shortness of breath.   amitriptyline 10 MG tablet Commonly known as: ELAVIL Take 10 mg by mouth at bedtime.   aspirin EC 325 MG tablet Take 1 tablet (325 mg total) by mouth 2 (two) times daily after a meal. Take x 1 month post  op to decrease risk of blood clots. What changed:  medication strength how much to take when to take this additional instructions   aspirin EC 325 MG tablet Take 1 tablet (325 mg total) by mouth 2 (two) times daily after a meal. Take x 1 month post op to decrease risk of blood clots. What changed: You were already taking a medication with the same name, and this prescription was added. Make sure you understand how and when to take each.   cholecalciferol 25 MCG (1000 UNIT) tablet Commonly known as: VITAMIN D3 Take 1,000 Units by mouth daily.   docusate sodium 100 MG capsule Commonly known as: Colace Take 1 capsule (100 mg total) by mouth 2 (two) times daily.   EPINEPHrine 0.3 mg/0.3 mL Soaj injection Commonly known as: EPI-PEN Inject 0.3  mg into the muscle as needed for anaphylaxis.   gabapentin 300 MG capsule Commonly known as: NEURONTIN TAKE 1 CAPSULE BY MOUTH DAILY IN THE MORNING, 1 TABLET DAILY IN THE EVENING AND 2 TABLETS AT BEDTIME What changed:  how much to take how to take this when to take this additional instructions   hydrALAZINE 25 MG tablet Commonly known as: APRESOLINE Take 25 mg by mouth in the morning and at bedtime.   losartan-hydrochlorothiazide 100-25 MG tablet Commonly known as: HYZAAR Take 1 tablet by mouth daily.   montelukast 10 MG tablet Commonly known as: SINGULAIR Take 1 tablet (10 mg total) by mouth at bedtime.   omeprazole 20 MG capsule Commonly known as: PRILOSEC Take 20 mg by mouth daily.   oxyCODONE-acetaminophen 5-325 MG tablet Commonly known as: Percocet Take 1 tablet by mouth every 4 (four) hours as needed for severe pain.   pyridOXINE 50 MG tablet Commonly known as: VITAMIN B-6 Take 50 mg by mouth daily.   senna-docusate 8.6-50 MG tablet Commonly known as: Senokot-S Take 1 tablet by mouth at bedtime.   spironolactone 25 MG tablet Commonly known as: ALDACTONE Take 25 mg by mouth daily.   tiZANidine 2 MG tablet Commonly known as: ZANAFLEX Take 1 tablet (2 mg total) by mouth every 8 (eight) hours as needed for muscle spasms.   tiZANidine 4 MG tablet Commonly known as: Zanaflex Take 1 tablet (4 mg total) by mouth every 8 (eight) hours as needed for muscle spasms.   tiZANidine 2 MG tablet Commonly known as: ZANAFLEX Take 1 tablet (2 mg total) by mouth every 8 (eight) hours as needed for muscle spasms.   topiramate 25 MG tablet Commonly known as: TOPAMAX Take 25 mg by mouth daily as needed (migraines).   Trelegy Ellipta 200-62.5-25 MCG/ACT Aepb Generic drug: Fluticasone-Umeclidin-Vilant Inhale 1 puff into the lungs daily.               Durable Medical Equipment  (From admission, onward)           Start     Ordered   09/20/21 1335  DME Walker  rolling  Once       Question:  Patient needs a walker to treat with the following condition  Answer:  Primary osteoarthritis of right knee   09/20/21 1335   09/20/21 1335  DME 3 n 1  Once        09/20/21 1335              Discharge Care Instructions  (From admission, onward)           Start     Ordered   09/24/21 0000  Weight bearing  as tolerated       Question Answer Comment  Laterality right   Extremity Lower      09/24/21 0826            Diagnostic Studies: DG Chest 2 View  Result Date: 09/20/2021 CLINICAL DATA:  Preop chest exam for knee replacement scheduled for 09/20/2021 EXAM: CHEST - 2 VIEW COMPARISON:  Sep 20, 2020 FINDINGS: The heart size and mediastinal contours are within normal limits. Both lungs are clear. The visualized skeletal structures are unremarkable. IMPRESSION: No active cardiopulmonary disease. Electronically Signed   By: Abelardo Diesel M.D.   On: 09/20/2021 09:51    Disposition: Discharge disposition: 01-Home or Self Care       Discharge Instructions     Call MD / Call 911   Complete by: As directed    If you experience chest pain or shortness of breath, CALL 911 and be transported to the hospital emergency room.  If you develope a fever above 101 F, pus (white drainage) or increased drainage or redness at the wound, or calf pain, call your surgeon's office.   Constipation Prevention   Complete by: As directed    Drink plenty of fluids.  Prune juice may be helpful.  You may use a stool softener, such as Colace (over the counter) 100 mg twice a day.  Use MiraLax (over the counter) for constipation as needed.   Diet general   Complete by: As directed    Increase activity slowly as tolerated   Complete by: As directed    Post-operative opioid taper instructions:   Complete by: As directed    POST-OPERATIVE OPIOID TAPER INSTRUCTIONS: It is important to wean off of your opioid medication as soon as possible. If you do not need pain  medication after your surgery it is ok to stop day one. Opioids include: Codeine, Hydrocodone(Norco, Vicodin), Oxycodone(Percocet, oxycontin) and hydromorphone amongst others.  Long term and even short term use of opiods can cause: Increased pain response Dependence Constipation Depression Respiratory depression And more.  Withdrawal symptoms can include Flu like symptoms Nausea, vomiting And more Techniques to manage these symptoms Hydrate well Eat regular healthy meals Stay active Use relaxation techniques(deep breathing, meditating, yoga) Do Not substitute Alcohol to help with tapering If you have been on opioids for less than two weeks and do not have pain than it is ok to stop all together.  Plan to wean off of opioids This plan should start within one week post op of your joint replacement. Maintain the same interval or time between taking each dose and first decrease the dose.  Cut the total daily intake of opioids by one tablet each day Next start to increase the time between doses. The last dose that should be eliminated is the evening dose.      Weight bearing as tolerated   Complete by: As directed    Laterality: right   Extremity: Lower        Follow-up Information     Dorna Leitz, MD. Schedule an appointment as soon as possible for a visit in 2 week(s).   Specialty: Orthopedic Surgery Contact information: [place] [phone]                  Signed: ANAIYA WISINSKI 09/24/2021, 8:29 AM

## 2021-09-24 NOTE — Progress Notes (Signed)
Orthopedic Tech Progress Note Patient Details:  Wendy Leblanc [birth year] [id number]  Patient ID: Wendy Leblanc, female   DOB: 12-21-1960, 61 y.o.   MRN: [id number] Patient is currently up in chair, will visit later to apply CPM.  [name] 09/24/2021, 2:08 PM

## 2021-09-24 NOTE — TOC Transition Note (Signed)
Transition of Care [facility]) - CM/SW Discharge Note  Patient Details  Name: Wendy Leblanc MRN: [id number] Date of Birth: 02/14/1961  [facility] CM/SW Contact:  Sherie Don, LCSW Phone Number: 09/24/2021, 12:59 PM  Clinical Narrative: Patient has not received the PASRR number, so the orthopedist has discharged the patient. Patient will go home with PT and DME set up through [facility]. CSW spoke with Mabeline Caras with [facility] regarding patient discharging home today. Per Elmyra Ricks, [facility] will set up PT and DME for the patient and will transport the patient back home this afternoon. CSW updated patient and RN. TOC signing off.  Final next level of care: Real Barriers to Discharge: Barriers Resolved  Patient Goals and CMS Choice Patient states their goals for this hospitalization and ongoing recovery are:: Return home with PT and DME set up through [facility] CMS Medicare.gov Compare Post Acute Care list provided to:: Patient Choice offered to / list presented to : Patient  Discharge Plan and Services In-house Referral: Clinical Social Work Post Acute Care Choice: [place]          DME Arranged: 3-N-1, Walker rolling DME Agency: Other - Comment ([facility]) Date DME Agency Contacted: 09/24/21 Representative spoke with at DME Agency: Elmyra Ricks HH Arranged: PT [place] Agency: Other - See comment ([facility])  Readmission Risk Interventions     View : No data to display.

## 2021-09-24 NOTE — Care Management Important Message (Signed)
Important Message  Patient Details IM Letter given to the Patient. Name: Wendy Leblanc MRN: [id number] Date of Birth: Sep 17, 1960   Medicare Important Message Given:  Yes     Kerin Salen 09/24/2021, 3:05 PM

## 2021-10-24 ENCOUNTER — Other Ambulatory Visit: Payer: Self-pay | Admitting: Family Medicine

## 2021-10-24 DIAGNOSIS — M4802 Spinal stenosis, cervical region: Secondary | ICD-10-CM

## 2021-10-29 ENCOUNTER — Other Ambulatory Visit: Payer: Medicare (Managed Care)

## 2021-11-06 ENCOUNTER — Other Ambulatory Visit: Payer: Self-pay | Admitting: Family Medicine

## 2021-11-06 DIAGNOSIS — R208 Other disturbances of skin sensation: Secondary | ICD-10-CM

## 2021-11-06 DIAGNOSIS — M159 Polyosteoarthritis, unspecified: Secondary | ICD-10-CM

## 2021-11-07 ENCOUNTER — Other Ambulatory Visit: Payer: Medicare (Managed Care)

## 2021-11-08 ENCOUNTER — Other Ambulatory Visit: Payer: Self-pay | Admitting: Family Medicine

## 2021-11-08 DIAGNOSIS — Z1231 Encounter for screening mammogram for malignant neoplasm of breast: Secondary | ICD-10-CM

## 2021-11-29 ENCOUNTER — Other Ambulatory Visit: Payer: Self-pay | Admitting: Vascular Surgery

## 2021-11-29 DIAGNOSIS — R208 Other disturbances of skin sensation: Secondary | ICD-10-CM

## 2021-11-29 DIAGNOSIS — M159 Polyosteoarthritis, unspecified: Secondary | ICD-10-CM

## 2021-12-02 ENCOUNTER — Other Ambulatory Visit: Payer: Medicare (Managed Care)

## 2021-12-27 ENCOUNTER — Encounter: Payer: Self-pay | Admitting: Primary Care

## 2021-12-27 ENCOUNTER — Ambulatory Visit (INDEPENDENT_AMBULATORY_CARE_PROVIDER_SITE_OTHER): Payer: Medicare (Managed Care) | Admitting: Primary Care

## 2021-12-27 VITALS — BP 128/74 | HR 105 | Temp 98.6°F | Ht 63.0 in | Wt 195.2 lb

## 2021-12-27 DIAGNOSIS — G4733 Obstructive sleep apnea (adult) (pediatric): Secondary | ICD-10-CM

## 2021-12-27 NOTE — Patient Instructions (Signed)
  Sleep apnea is defined as period of 10 seconds or longer when you stop breathing at night. This can happen multiple times a night. Dx sleep apnea is when this occurs more than 5 times an hour.    Mild OSA 5-15 apneic events an hour Moderate OSA 15-30 apneic events an hour Severe OSA > 30 apneic events an hour   Untreated sleep apnea puts you at higher risk for cardiac arrhythmias, pulmonary HTN, stroke and diabetes   Treatment options include weight loss, side sleeping position, oral appliance, CPAP therapy or referral to ENT for possible surgical options    Recommendations: Focus on side sleeping position or elevated head of bed at night 30 degrees while sleeping Work on weight loss efforts if able  Do not drive if experiencing excessive daytime sleepiness of fatigue    Orders: In-lab sleep study    Follow-up: 6-8 weeks with Eustaquio Maize NP

## 2021-12-27 NOTE — Progress Notes (Unsigned)
$'@Patient'Z$  ID: Wendy Leblanc, female    DOB: 1960-07-18, 61 y.o.   MRN: [id number]  No chief complaint on file.   Referring provider: Drue Flirt, MD  HPI: 61 year old female, current every day smoker. PMH significant for hypertension, hyperlipidemia, CVA, moderate persistent asthma, OSA. Patient of Dr. Erin Fulling.  Previous [facility] pulmonary encounter:  09/18/2021 Patient presents today for surgical clearance. She is followed by our office for hx moderate persistent asthma. During her last visit in June she was changed from Advair 250-91mg to [place] [facility] function testing in February 2022 were normal, FEV1 1.58L (79%). She is scheduled for upcoming right total knee arthroplasty on May 19th with Dr. GBerenice Primas She noticed an improvement in her breathing when switched to Trelegy. She uses her Albuterol rescue inhaler on average 1-2 times a day as needed. No acute respiratory complaints today. She is ordered for CXR to be completed with pre-op work up tomorrow. She denies acute shortness of breath, chest tightness, wheezing or cough.   12/27/2021- interim hx  Patient presents today for OSA/CPAP follow-up. She has a hx moderate sleep apnea, during last office visit in May she was not consistently wearing her CPAP. We have not been managing her sleep apnea. She has not worn CPAP in over a year. Machine has a smell to is and she feels like she is suffocating when she wears it. She has symptoms of snoring and daytime sleepiness.  Typical bedtime is between 9 and 10 PM.  It takes her 30 minutes to fall asleep.  She wakes up on average every 2 hours.  She starts her day at 5 AM. Epworth score 11.  Denies symptoms of narcolepsy, cataplexy or sleepwalking.   Symptoms- snoring, daytime sleepiness  Prior sleep study- 10/23/2019 NPSG >> AHI 25.3/hr  Bedtime- 9-10pm Time to fall asleep- 30 mins Nocturnal awakenings- three times Start of day- 5am  CPAP- not currently Oxygen use- no Epworth-  11  Pulmonary function testing 06/13/20 PFTs- FVC 2.16 (84%), FEV1 1.58 (79%), ratio 92 Normal pulmonary function, normal diffusion capacity. No BD response.    Allergies  Allergen Reactions   Penicillins Anaphylaxis   Amlodipine Hives and Swelling    Lip swelling   Contrast Media [Iodinated Contrast Media] Hives   Elemental Sulfur Hives   Lisinopril Swelling    Immunization History  Administered Date(s) Administered   Moderna Sars-Covid-2 Vaccination 08/02/2019, 08/24/2019, 03/10/2020, 10/19/2020    Past Medical History:  Diagnosis Date   Anemia    Anxiety    Arthritis    Carpal tunnel syndrome    Cataract    [place]    Depression    Dyspnea    Gait abnormality    GERD (gastroesophageal reflux disease)    Hyperlipidemia    Hypertension    Migraine    Osteoarthritis    Pneumonia    Smoker    [place]    Tobacco History: Social History   Tobacco Use  Smoking Status Every Day   Packs/day: 0.50   Years: 45.00   Total pack years: 22.50   Types: Cigarettes  Smokeless Tobacco Never  Tobacco Comments   down to 1/4 ppd 06/15/20   Ready to quit: Not Answered Counseling given: Not Answered Tobacco comments: down to 1/4 ppd 06/15/20   Outpatient Medications Prior to Visit  Medication Sig Dispense Refill   albuterol (PROVENTIL) (2.5 MG/3ML) 0.083% nebulizer solution Take 3 mLs (2.5 mg total) by nebulization  every 6 (six) hours as needed for wheezing or shortness of breath. 75 mL 12   albuterol (VENTOLIN HFA) 108 (90 Base) MCG/ACT inhaler Inhale 1-2 puffs into the lungs every 4 (four) hours as needed for wheezing or shortness of breath. 8 g 11   amitriptyline (ELAVIL) 10 MG tablet Take 10 mg by mouth at bedtime.     aspirin EC 325 MG tablet Take 1 tablet (325 mg total) by mouth 2 (two) times daily after a meal. Take x 1 month post op to decrease risk of blood clots. 60 tablet 0   aspirin EC 325 MG tablet Take 1 tablet (325 mg  total) by mouth 2 (two) times daily after a meal. Take x 1 month post op to decrease risk of blood clots. 60 tablet 0   cholecalciferol (VITAMIN D3) 25 MCG (1000 UNIT) tablet Take 1,000 Units by mouth daily.     docusate sodium (COLACE) 100 MG capsule Take 1 capsule (100 mg total) by mouth 2 (two) times daily. 30 capsule 0   EPINEPHrine 0.3 mg/0.3 mL IJ SOAJ injection Inject 0.3 mg into the muscle as needed for anaphylaxis. 1 each 0   Fluticasone-Umeclidin-Vilant (TRELEGY ELLIPTA) 200-62.5-25 MCG/INH AEPB Inhale 1 puff into the lungs daily. 60 each 6   gabapentin (NEURONTIN) 300 MG capsule TAKE 1 CAPSULE BY MOUTH DAILY IN THE MORNING, 1 TABLET DAILY IN THE EVENING AND 2 TABLETS AT BEDTIME (Patient taking differently: Take 300-600 mg by mouth See admin instructions. Take 300 mg in the morning and 600 mg in the evening) 120 capsule 5   hydrALAZINE (APRESOLINE) 25 MG tablet Take 25 mg by mouth in the morning and at bedtime.     losartan-hydrochlorothiazide (HYZAAR) 100-25 MG tablet Take 1 tablet by mouth daily.     montelukast (SINGULAIR) 10 MG tablet Take 1 tablet (10 mg total) by mouth at bedtime. 30 tablet 11   omeprazole (PRILOSEC) 20 MG capsule Take 20 mg by mouth daily.     oxyCODONE-acetaminophen (PERCOCET) 5-325 MG tablet Take 1 tablet by mouth every 4 (four) hours as needed for severe pain. 40 tablet 0   pyridOXINE (VITAMIN B-6) 50 MG tablet Take 50 mg by mouth daily.     senna-docusate (SENOKOT-S) 8.6-50 MG tablet Take 1 tablet by mouth at bedtime.     spironolactone (ALDACTONE) 25 MG tablet Take 25 mg by mouth daily.     tiZANidine (ZANAFLEX) 2 MG tablet Take 1 tablet (2 mg total) by mouth every 8 (eight) hours as needed for muscle spasms. 40 tablet 0   tiZANidine (ZANAFLEX) 2 MG tablet Take 1 tablet (2 mg total) by mouth every 8 (eight) hours as needed for muscle spasms. 40 tablet 0   tiZANidine (ZANAFLEX) 4 MG tablet Take 1 tablet (4 mg total) by mouth every 8 (eight) hours as needed for  muscle spasms. 30 tablet 0   topiramate (TOPAMAX) 25 MG tablet Take 25 mg by mouth daily as needed (migraines).  0   No facility-administered medications prior to visit.    Review of Systems  Review of Systems  Constitutional:  Positive for fatigue.  HENT: Negative.    Respiratory: Negative.    Cardiovascular: Negative.   Psychiatric/Behavioral:  Positive for sleep disturbance.      Physical Exam  There were no vitals taken for this visit. Physical Exam Constitutional:      Appearance: Normal appearance.  HENT:     Head: Normocephalic and atraumatic.     Mouth/Throat:  Mouth: Mucous membranes are moist.     Pharynx: Oropharynx is clear.  Cardiovascular:     Rate and Rhythm: Normal rate and regular rhythm.  Pulmonary:     Effort: Pulmonary effort is normal.     Breath sounds: Normal breath sounds.  Skin:    General: Skin is warm and dry.  Neurological:     General: No focal deficit present.     Mental Status: She is alert and oriented to person, place, and time. Mental status is at baseline.  Psychiatric:        Mood and Affect: Mood normal.        Behavior: Behavior normal.        Thought Content: Thought content normal.        Judgment: Judgment normal.      Lab Results:  CBC    Component Value Date/Time   WBC 14.3 (H) 09/22/2021 0308   RBC 3.88 09/22/2021 0308   HGB 11.4 (L) 09/22/2021 0308   HCT 35.4 (L) 09/22/2021 0308   PLT 281 09/22/2021 0308   MCV 91.2 09/22/2021 0308   MCH 29.4 09/22/2021 0308   MCHC 32.2 09/22/2021 0308   RDW 14.4 09/22/2021 0308   LYMPHSABS 1.7 09/20/2020 0944   MONOABS 0.4 09/20/2020 0944   EOSABS 0.4 09/20/2020 0944   BASOSABS 0.0 09/20/2020 0944    BMET    Component Value Date/Time   NA 136 09/21/2021 0332   NA 143 01/17/2020 0812   K 4.8 09/21/2021 0332   CL 105 09/21/2021 0332   CO2 23 09/21/2021 0332   GLUCOSE 155 (H) 09/21/2021 0332   BUN 25 (H) 09/21/2021 0332   BUN 16 01/17/2020 0812   CREATININE 1.38  (H) 09/21/2021 0332   CALCIUM 9.1 09/21/2021 0332   GFRNONAA 44 (L) 09/21/2021 0332   GFRAA 74 01/17/2020 0812    BNP    Component Value Date/Time   BNP 33.5 09/20/2020 1045    ProBNP No results found for: "PROBNP"  Imaging: No results found.   Assessment & Plan:   No problem-specific Assessment & Plan notes found for this encounter.     Martyn Ehrich, NP 12/27/2021

## 2022-01-02 NOTE — Assessment & Plan Note (Signed)
-   Hx sleep apnea, has not worn CPAP in >1 year. She has symptoms of snoring and daytime sleepiness. Epworth score 11. Needs repeat sleep study to reassess for sleep apnea. Discussed risks of untreated sleep apnea including cardiac arrhthymias, stroke, pulm HTN and diabetes. We also discussed treatment options including weight loss, oral appliance, CPAP or referral to ENT for possible surgical options. We will follow up after sleep study to review results and treatment options further if needed.

## 2022-01-26 ENCOUNTER — Encounter (HOSPITAL_BASED_OUTPATIENT_CLINIC_OR_DEPARTMENT_OTHER): Payer: Medicare (Managed Care) | Admitting: Pulmonary Disease

## 2022-02-14 ENCOUNTER — Ambulatory Visit: Payer: Medicare (Managed Care) | Admitting: Primary Care

## 2022-02-24 ENCOUNTER — Ambulatory Visit
Admission: RE | Admit: 2022-02-24 | Discharge: 2022-02-24 | Disposition: A | Discharge status: Home / Self Care | Payer: Medicare (Managed Care) | Source: Ambulatory Visit | Attending: Family Medicine | Admitting: Family Medicine

## 2022-02-24 DIAGNOSIS — Z1231 Encounter for screening mammogram for malignant neoplasm of breast: Secondary | ICD-10-CM

## 2022-03-06 ENCOUNTER — Telehealth: Payer: Self-pay | Admitting: Primary Care

## 2022-03-06 ENCOUNTER — Ambulatory Visit: Payer: Medicare (Managed Care) | Admitting: Primary Care

## 2022-03-06 DIAGNOSIS — G4733 Obstructive sleep apnea (adult) (pediatric): Secondary | ICD-10-CM

## 2022-03-14 NOTE — Telephone Encounter (Signed)
Beth, can you please advise? Thanks!

## 2022-03-14 NOTE — Telephone Encounter (Signed)
I have cancelled inlab study.  Order will need to be placed for home sleep study.  Routing back to triage for order to be placed.

## 2022-03-14 NOTE — Telephone Encounter (Signed)
Noted.   Patient made aware.   Will route message to [facility] team to asst with scheduling.

## 2022-03-14 NOTE — Telephone Encounter (Signed)
Order for home sleep test has been placed.

## 2022-03-14 NOTE — Telephone Encounter (Signed)
Yes we can change to home sleep study

## 2022-04-02 ENCOUNTER — Encounter (HOSPITAL_BASED_OUTPATIENT_CLINIC_OR_DEPARTMENT_OTHER): Payer: Medicare (Managed Care) | Admitting: Pulmonary Disease

## 2022-04-11 ENCOUNTER — Ambulatory Visit: Payer: Medicare (Managed Care) | Admitting: Primary Care

## 2022-04-16 ENCOUNTER — Ambulatory Visit: Payer: Medicare (Managed Care) | Admitting: Primary Care

## 2022-04-16 DIAGNOSIS — G4733 Obstructive sleep apnea (adult) (pediatric): Secondary | ICD-10-CM | POA: Diagnosis not present

## 2022-04-18 DIAGNOSIS — G4733 Obstructive sleep apnea (adult) (pediatric): Secondary | ICD-10-CM | POA: Diagnosis not present

## 2022-04-21 NOTE — Progress Notes (Signed)
Please let patient know HST showed mild OSA, she has an apt on 12/29 to discuss results and treatment

## 2022-04-23 NOTE — Progress Notes (Signed)
Called patient.  Left message for pt to call back regarding HST.

## 2022-05-02 ENCOUNTER — Ambulatory Visit: Payer: Medicare (Managed Care) | Admitting: Primary Care

## 2022-05-21 ENCOUNTER — Ambulatory Visit: Payer: Medicare (Managed Care) | Admitting: Primary Care

## 2022-09-23 ENCOUNTER — Encounter: Payer: Self-pay | Admitting: Gastroenterology

## 2023-01-02 ENCOUNTER — Telehealth: Payer: Self-pay | Admitting: Cardiovascular Disease

## 2023-01-02 NOTE — Telephone Encounter (Signed)
Left vm for pt. Pt needs to schedule follow up.

## 2023-03-24 NOTE — Progress Notes (Signed)
Patient cancelled and no showed two appointments. Will close encounter

## 2023-08-25 IMAGING — DX DG CHEST 2V
2 series · 2 of 2 positions shown · non-contrast
Comparison: September 20, 2020

CLINICAL DATA: Preop chest exam for knee replacement scheduled for
09/20/2021

EXAM:
CHEST - 2 VIEW

[chest pa]
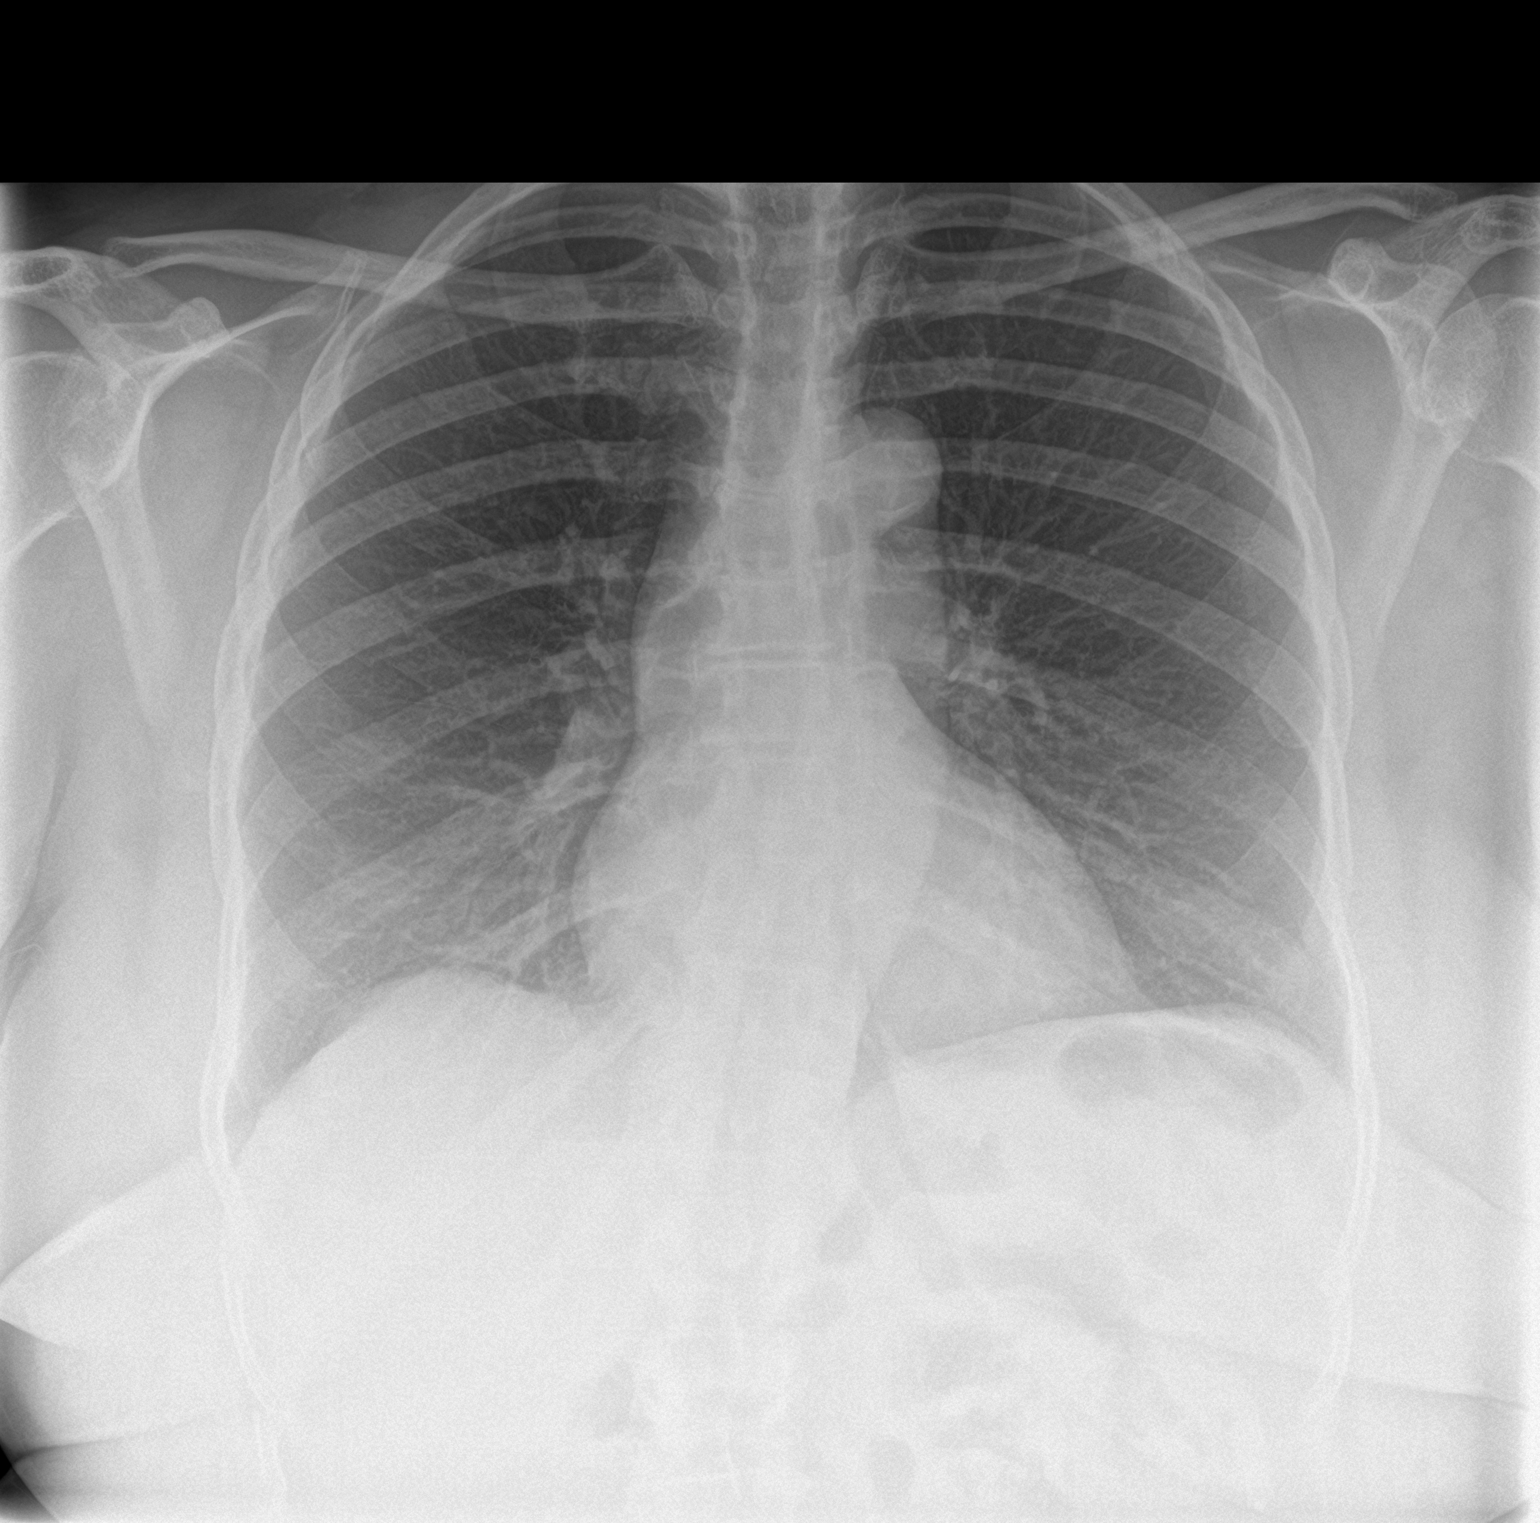

[chest lat]
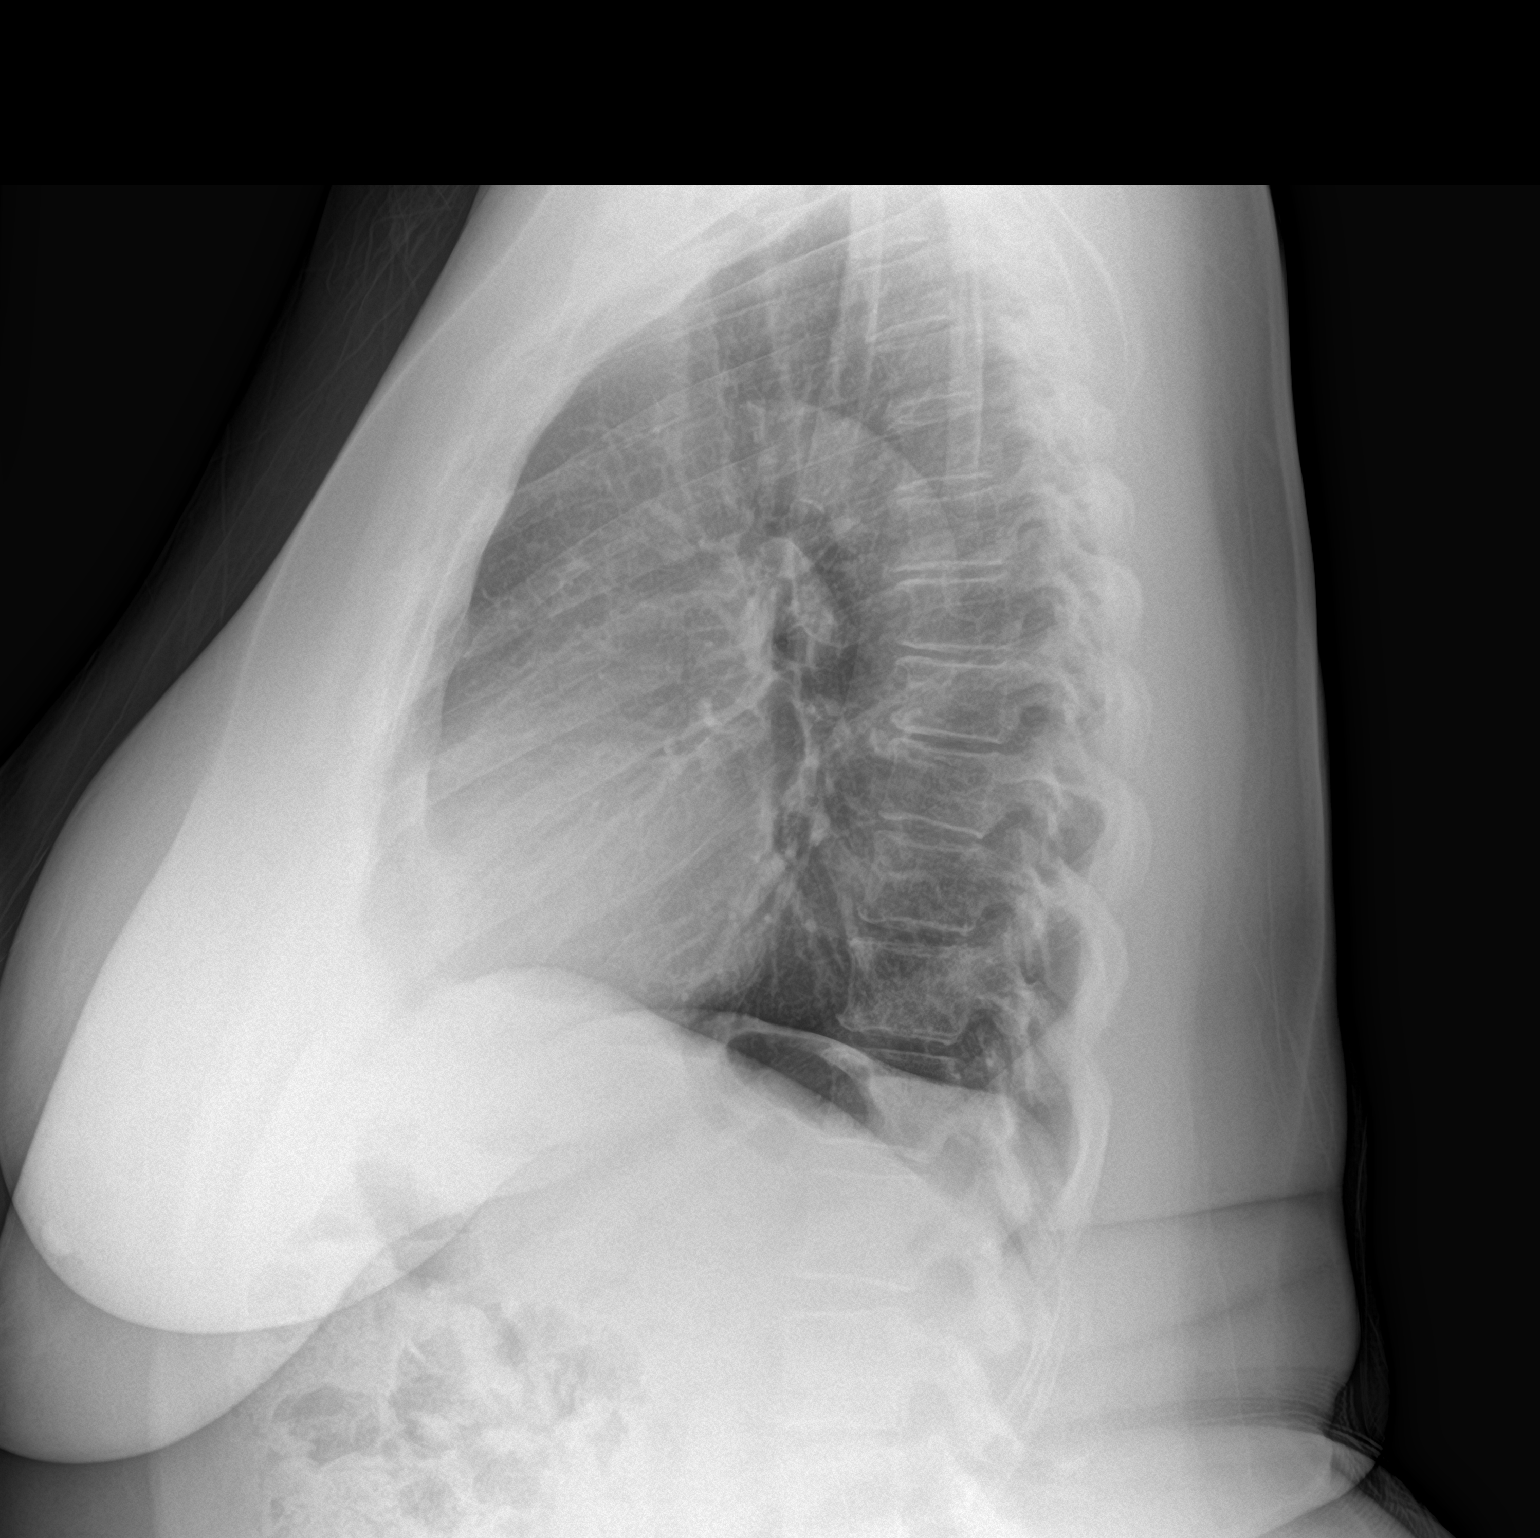

[2 of 2 positions shown; findings below may reference images not displayed]

FINDINGS: The heart size and mediastinal contours are within normal limits.
Both lungs are clear. The visualized skeletal structures are
unremarkable.
IMPRESSION: No active cardiopulmonary disease.
